# Patient Record
Sex: Female | Born: 1955
Health system: Southern US, Community
[De-identification: ages and names within clinical notes are randomized; demographics above are authoritative.]

## PROBLEM LIST (undated history)

## (undated) ENCOUNTER — Ambulatory Visit: Payer: Medicare HMO

## (undated) DIAGNOSIS — F32A Depression, unspecified: Secondary | ICD-10-CM

## (undated) DIAGNOSIS — Z8719 Personal history of other diseases of the digestive system: Secondary | ICD-10-CM

## (undated) DIAGNOSIS — E785 Hyperlipidemia, unspecified: Secondary | ICD-10-CM

## (undated) DIAGNOSIS — M51369 Other intervertebral disc degeneration, lumbar region without mention of lumbar back pain or lower extremity pain: Secondary | ICD-10-CM

## (undated) DIAGNOSIS — M5136 Other intervertebral disc degeneration, lumbar region: Secondary | ICD-10-CM

## (undated) DIAGNOSIS — E039 Hypothyroidism, unspecified: Secondary | ICD-10-CM

## (undated) DIAGNOSIS — R7303 Prediabetes: Secondary | ICD-10-CM

## (undated) DIAGNOSIS — K219 Gastro-esophageal reflux disease without esophagitis: Secondary | ICD-10-CM

## (undated) DIAGNOSIS — F419 Anxiety disorder, unspecified: Secondary | ICD-10-CM

## (undated) DIAGNOSIS — T7840XA Allergy, unspecified, initial encounter: Secondary | ICD-10-CM

## (undated) DIAGNOSIS — Z87442 Personal history of urinary calculi: Secondary | ICD-10-CM

## (undated) DIAGNOSIS — Z8709 Personal history of other diseases of the respiratory system: Secondary | ICD-10-CM

## (undated) DIAGNOSIS — Z9689 Presence of other specified functional implants: Secondary | ICD-10-CM

## (undated) DIAGNOSIS — E079 Disorder of thyroid, unspecified: Secondary | ICD-10-CM

## (undated) DIAGNOSIS — I10 Essential (primary) hypertension: Secondary | ICD-10-CM

## (undated) DIAGNOSIS — M1990 Unspecified osteoarthritis, unspecified site: Secondary | ICD-10-CM

## (undated) DIAGNOSIS — K759 Inflammatory liver disease, unspecified: Secondary | ICD-10-CM

## (undated) DIAGNOSIS — H269 Unspecified cataract: Secondary | ICD-10-CM

## (undated) HISTORY — PX: ESOPHAGEAL DILATION: SHX303

## (undated) HISTORY — DX: Essential (primary) hypertension: I10

## (undated) HISTORY — PX: CHOLECYSTECTOMY: SHX55

## (undated) HISTORY — DX: Allergy, unspecified, initial encounter: T78.40XA

## (undated) HISTORY — PX: LAPAROSCOPIC ENDOMETRIOSIS FULGURATION: SUR769

## (undated) HISTORY — DX: Disorder of thyroid, unspecified: E07.9

## (undated) HISTORY — DX: Hyperlipidemia, unspecified: E78.5

## (undated) HISTORY — DX: Inflammatory liver disease, unspecified: K75.9

## (undated) HISTORY — PX: TONSILLECTOMY: SUR1361

## (undated) HISTORY — PX: SPINE SURGERY: SHX786

## (undated) HISTORY — PX: TUBAL LIGATION: SHX77

## (undated) HISTORY — DX: Unspecified cataract: H26.9

## (undated) HISTORY — PX: COLONOSCOPY: SHX174

---

## 1980-10-18 HISTORY — PX: BREAST EXCISIONAL BIOPSY: SUR124

## 1981-10-18 HISTORY — PX: BREAST SURGERY: SHX581

## 1989-01-16 HISTORY — PX: OTHER SURGICAL HISTORY: SHX169

## 1999-11-09 ENCOUNTER — Other Ambulatory Visit: Admission: RE | Admit: 1999-11-09 | Discharge: 1999-11-09 | Payer: Self-pay | Admitting: Gynecology

## 1999-11-09 ENCOUNTER — Encounter (INDEPENDENT_AMBULATORY_CARE_PROVIDER_SITE_OTHER): Payer: Self-pay | Admitting: Specialist

## 2000-03-03 ENCOUNTER — Ambulatory Visit (HOSPITAL_COMMUNITY): Admission: RE | Admit: 2000-03-03 | Discharge: 2000-03-03 | Payer: Self-pay | Admitting: Gastroenterology

## 2000-03-03 ENCOUNTER — Encounter: Payer: Self-pay | Admitting: Gastroenterology

## 2000-03-18 ENCOUNTER — Encounter: Payer: Self-pay | Admitting: Gastroenterology

## 2000-03-18 ENCOUNTER — Ambulatory Visit (HOSPITAL_COMMUNITY): Admission: RE | Admit: 2000-03-18 | Discharge: 2000-03-18 | Payer: Self-pay | Admitting: Gastroenterology

## 2000-07-06 ENCOUNTER — Encounter: Payer: Self-pay | Admitting: Gastroenterology

## 2000-07-06 ENCOUNTER — Ambulatory Visit (HOSPITAL_COMMUNITY): Admission: RE | Admit: 2000-07-06 | Discharge: 2000-07-06 | Payer: Self-pay | Admitting: Gastroenterology

## 2000-08-09 ENCOUNTER — Encounter (INDEPENDENT_AMBULATORY_CARE_PROVIDER_SITE_OTHER): Payer: Self-pay | Admitting: Specialist

## 2000-08-09 ENCOUNTER — Ambulatory Visit (HOSPITAL_COMMUNITY): Admission: RE | Admit: 2000-08-09 | Discharge: 2000-08-10 | Payer: Self-pay | Admitting: *Deleted

## 2000-10-18 HISTORY — PX: LIVER BIOPSY: SHX301

## 2000-10-18 HISTORY — PX: CHOLECYSTECTOMY: SHX55

## 2000-12-13 ENCOUNTER — Encounter: Payer: Self-pay | Admitting: Emergency Medicine

## 2000-12-13 ENCOUNTER — Emergency Department (HOSPITAL_COMMUNITY): Admission: EM | Admit: 2000-12-13 | Discharge: 2000-12-13 | Payer: Self-pay | Admitting: Emergency Medicine

## 2001-04-28 ENCOUNTER — Encounter: Payer: Self-pay | Admitting: Gastroenterology

## 2001-04-28 ENCOUNTER — Ambulatory Visit (HOSPITAL_COMMUNITY): Admission: RE | Admit: 2001-04-28 | Discharge: 2001-04-28 | Payer: Self-pay | Admitting: Gastroenterology

## 2001-12-29 ENCOUNTER — Emergency Department (HOSPITAL_COMMUNITY): Admission: EM | Admit: 2001-12-29 | Discharge: 2001-12-30 | Payer: Self-pay | Admitting: Emergency Medicine

## 2002-01-10 ENCOUNTER — Ambulatory Visit (HOSPITAL_COMMUNITY): Admission: RE | Admit: 2002-01-10 | Discharge: 2002-01-10 | Payer: Self-pay | Admitting: Gastroenterology

## 2002-01-19 ENCOUNTER — Ambulatory Visit (HOSPITAL_COMMUNITY): Admission: RE | Admit: 2002-01-19 | Discharge: 2002-01-19 | Payer: Self-pay | Admitting: Gastroenterology

## 2002-01-19 ENCOUNTER — Encounter: Payer: Self-pay | Admitting: Gastroenterology

## 2002-03-18 DIAGNOSIS — B1710 Acute hepatitis C without hepatic coma: Secondary | ICD-10-CM | POA: Insufficient documentation

## 2003-05-06 ENCOUNTER — Encounter: Payer: Self-pay | Admitting: Neurosurgery

## 2003-05-06 ENCOUNTER — Encounter: Admission: RE | Admit: 2003-05-06 | Discharge: 2003-05-06 | Payer: Self-pay | Admitting: Neurosurgery

## 2003-05-06 ENCOUNTER — Encounter: Payer: Self-pay | Admitting: Diagnostic Radiology

## 2003-05-27 ENCOUNTER — Encounter: Payer: Self-pay | Admitting: Neurosurgery

## 2003-05-27 ENCOUNTER — Encounter: Admission: RE | Admit: 2003-05-27 | Discharge: 2003-05-27 | Payer: Self-pay | Admitting: Neurosurgery

## 2003-07-29 ENCOUNTER — Encounter: Payer: Self-pay | Admitting: Neurosurgery

## 2003-07-29 ENCOUNTER — Encounter: Admission: RE | Admit: 2003-07-29 | Discharge: 2003-07-29 | Payer: Self-pay | Admitting: Neurosurgery

## 2003-08-13 ENCOUNTER — Inpatient Hospital Stay (HOSPITAL_COMMUNITY): Admission: RE | Admit: 2003-08-13 | Discharge: 2003-08-16 | Payer: Self-pay | Admitting: Neurosurgery

## 2003-08-13 HISTORY — PX: LUMBAR DISC SURGERY: SHX700

## 2003-09-19 ENCOUNTER — Ambulatory Visit (HOSPITAL_COMMUNITY): Admission: EM | Admit: 2003-09-19 | Discharge: 2003-09-19 | Payer: Self-pay | Admitting: *Deleted

## 2003-09-19 ENCOUNTER — Encounter (INDEPENDENT_AMBULATORY_CARE_PROVIDER_SITE_OTHER): Payer: Self-pay | Admitting: Specialist

## 2004-02-04 ENCOUNTER — Other Ambulatory Visit: Admission: RE | Admit: 2004-02-04 | Discharge: 2004-02-04 | Payer: Self-pay | Admitting: Gynecology

## 2004-02-06 ENCOUNTER — Encounter: Admission: RE | Admit: 2004-02-06 | Discharge: 2004-02-06 | Payer: Self-pay | Admitting: Gynecology

## 2004-02-19 ENCOUNTER — Encounter: Admission: RE | Admit: 2004-02-19 | Discharge: 2004-02-19 | Payer: Self-pay | Admitting: Neurosurgery

## 2004-07-10 ENCOUNTER — Encounter: Admission: RE | Admit: 2004-07-10 | Discharge: 2004-07-10 | Payer: Self-pay | Admitting: Neurosurgery

## 2004-08-21 ENCOUNTER — Ambulatory Visit: Payer: Self-pay | Admitting: Internal Medicine

## 2004-09-28 ENCOUNTER — Ambulatory Visit: Payer: Self-pay | Admitting: Internal Medicine

## 2004-10-14 ENCOUNTER — Other Ambulatory Visit: Admission: RE | Admit: 2004-10-14 | Discharge: 2004-10-14 | Payer: Self-pay | Admitting: Gynecology

## 2004-10-27 ENCOUNTER — Ambulatory Visit: Payer: Self-pay | Admitting: Internal Medicine

## 2004-12-10 ENCOUNTER — Encounter: Admission: RE | Admit: 2004-12-10 | Discharge: 2004-12-10 | Payer: Self-pay | Admitting: Neurosurgery

## 2004-12-21 ENCOUNTER — Ambulatory Visit (HOSPITAL_COMMUNITY): Admission: RE | Admit: 2004-12-21 | Discharge: 2004-12-21 | Payer: Self-pay | Admitting: Neurosurgery

## 2004-12-29 ENCOUNTER — Ambulatory Visit (HOSPITAL_COMMUNITY): Admission: RE | Admit: 2004-12-29 | Discharge: 2004-12-30 | Payer: Self-pay | Admitting: Neurosurgery

## 2004-12-29 HISTORY — PX: SPINAL CORD STIMULATOR INSERTION: SHX5378

## 2005-01-01 ENCOUNTER — Inpatient Hospital Stay (HOSPITAL_COMMUNITY): Admission: EM | Admit: 2005-01-01 | Discharge: 2005-01-03 | Payer: Self-pay | Admitting: Emergency Medicine

## 2005-01-01 ENCOUNTER — Ambulatory Visit: Payer: Self-pay | Admitting: Endocrinology

## 2005-01-05 ENCOUNTER — Inpatient Hospital Stay (HOSPITAL_COMMUNITY): Admission: RE | Admit: 2005-01-05 | Discharge: 2005-01-06 | Payer: Self-pay | Admitting: Neurosurgery

## 2005-01-05 HISTORY — PX: OTHER SURGICAL HISTORY: SHX169

## 2005-01-13 ENCOUNTER — Ambulatory Visit: Payer: Self-pay | Admitting: Internal Medicine

## 2005-01-21 ENCOUNTER — Ambulatory Visit: Payer: Self-pay | Admitting: Internal Medicine

## 2005-02-08 ENCOUNTER — Encounter: Admission: RE | Admit: 2005-02-08 | Discharge: 2005-02-08 | Payer: Self-pay | Admitting: Internal Medicine

## 2005-02-09 ENCOUNTER — Ambulatory Visit: Payer: Self-pay | Admitting: Internal Medicine

## 2005-02-16 ENCOUNTER — Ambulatory Visit: Payer: Self-pay | Admitting: Gastroenterology

## 2005-03-11 ENCOUNTER — Ambulatory Visit: Payer: Self-pay | Admitting: Gastroenterology

## 2005-03-29 ENCOUNTER — Ambulatory Visit: Payer: Self-pay | Admitting: Gastroenterology

## 2005-06-16 ENCOUNTER — Ambulatory Visit: Payer: Self-pay | Admitting: Internal Medicine

## 2005-07-07 ENCOUNTER — Ambulatory Visit: Payer: Self-pay | Admitting: Internal Medicine

## 2005-07-23 ENCOUNTER — Inpatient Hospital Stay (HOSPITAL_COMMUNITY): Admission: RE | Admit: 2005-07-23 | Discharge: 2005-07-26 | Payer: Self-pay | Admitting: Neurosurgery

## 2005-07-23 HISTORY — PX: OTHER SURGICAL HISTORY: SHX169

## 2005-08-26 ENCOUNTER — Ambulatory Visit: Payer: Self-pay | Admitting: Internal Medicine

## 2005-09-06 ENCOUNTER — Ambulatory Visit: Payer: Self-pay | Admitting: Internal Medicine

## 2005-09-16 ENCOUNTER — Ambulatory Visit: Payer: Self-pay | Admitting: Internal Medicine

## 2005-09-17 ENCOUNTER — Inpatient Hospital Stay (HOSPITAL_COMMUNITY): Admission: EM | Admit: 2005-09-17 | Discharge: 2005-09-20 | Payer: Self-pay | Admitting: Emergency Medicine

## 2005-09-20 ENCOUNTER — Ambulatory Visit: Payer: Self-pay | Admitting: Internal Medicine

## 2005-09-22 ENCOUNTER — Ambulatory Visit: Payer: Self-pay | Admitting: Internal Medicine

## 2005-09-29 ENCOUNTER — Ambulatory Visit: Payer: Self-pay | Admitting: Internal Medicine

## 2005-11-01 ENCOUNTER — Ambulatory Visit: Payer: Self-pay | Admitting: Internal Medicine

## 2005-11-03 ENCOUNTER — Other Ambulatory Visit: Admission: RE | Admit: 2005-11-03 | Discharge: 2005-11-03 | Payer: Self-pay | Admitting: Gynecology

## 2005-11-05 ENCOUNTER — Ambulatory Visit: Payer: Self-pay | Admitting: Family Medicine

## 2005-11-08 ENCOUNTER — Ambulatory Visit: Payer: Self-pay | Admitting: Internal Medicine

## 2005-11-29 ENCOUNTER — Ambulatory Visit: Payer: Self-pay | Admitting: Internal Medicine

## 2006-01-18 ENCOUNTER — Ambulatory Visit: Payer: Self-pay | Admitting: Family Medicine

## 2006-02-09 ENCOUNTER — Encounter: Admission: RE | Admit: 2006-02-09 | Discharge: 2006-02-09 | Payer: Self-pay | Admitting: Gynecology

## 2006-02-18 ENCOUNTER — Ambulatory Visit: Payer: Self-pay | Admitting: Internal Medicine

## 2006-03-11 ENCOUNTER — Encounter: Admission: RE | Admit: 2006-03-11 | Discharge: 2006-03-11 | Payer: Self-pay | Admitting: Gynecology

## 2006-03-31 ENCOUNTER — Ambulatory Visit: Payer: Self-pay | Admitting: Family Medicine

## 2006-03-31 ENCOUNTER — Ambulatory Visit: Payer: Self-pay | Admitting: Cardiology

## 2006-04-06 ENCOUNTER — Encounter: Admission: RE | Admit: 2006-04-06 | Discharge: 2006-04-06 | Payer: Self-pay | Admitting: Neurosurgery

## 2006-04-28 ENCOUNTER — Encounter: Payer: Self-pay | Admitting: Internal Medicine

## 2006-05-13 ENCOUNTER — Inpatient Hospital Stay (HOSPITAL_COMMUNITY): Admission: RE | Admit: 2006-05-13 | Discharge: 2006-05-16 | Payer: Self-pay | Admitting: Neurosurgery

## 2006-05-13 HISTORY — PX: OTHER SURGICAL HISTORY: SHX169

## 2006-06-23 ENCOUNTER — Ambulatory Visit: Payer: Self-pay | Admitting: Internal Medicine

## 2006-08-02 ENCOUNTER — Ambulatory Visit: Payer: Self-pay | Admitting: Internal Medicine

## 2006-08-02 ENCOUNTER — Encounter: Admission: RE | Admit: 2006-08-02 | Discharge: 2006-08-02 | Payer: Self-pay | Admitting: Internal Medicine

## 2006-09-20 ENCOUNTER — Other Ambulatory Visit: Admission: RE | Admit: 2006-09-20 | Discharge: 2006-09-20 | Payer: Self-pay | Admitting: Gynecology

## 2006-09-26 ENCOUNTER — Ambulatory Visit: Payer: Self-pay | Admitting: Internal Medicine

## 2006-09-27 ENCOUNTER — Ambulatory Visit: Payer: Self-pay | Admitting: Gastroenterology

## 2006-10-14 ENCOUNTER — Ambulatory Visit: Payer: Self-pay | Admitting: Internal Medicine

## 2006-11-02 ENCOUNTER — Ambulatory Visit: Payer: Self-pay | Admitting: Internal Medicine

## 2006-11-24 ENCOUNTER — Ambulatory Visit: Payer: Self-pay | Admitting: Internal Medicine

## 2007-01-17 ENCOUNTER — Ambulatory Visit: Payer: Self-pay | Admitting: Internal Medicine

## 2007-01-17 LAB — CONVERTED CEMR LAB: TSH: 0.27 microintl units/mL — ABNORMAL LOW (ref 0.35–5.50)

## 2007-02-07 DIAGNOSIS — E890 Postprocedural hypothyroidism: Secondary | ICD-10-CM | POA: Insufficient documentation

## 2007-02-07 DIAGNOSIS — E039 Hypothyroidism, unspecified: Secondary | ICD-10-CM | POA: Insufficient documentation

## 2007-02-07 DIAGNOSIS — R51 Headache: Secondary | ICD-10-CM | POA: Insufficient documentation

## 2007-02-07 DIAGNOSIS — M899 Disorder of bone, unspecified: Secondary | ICD-10-CM | POA: Insufficient documentation

## 2007-02-07 DIAGNOSIS — R519 Headache, unspecified: Secondary | ICD-10-CM | POA: Insufficient documentation

## 2007-02-07 DIAGNOSIS — M949 Disorder of cartilage, unspecified: Secondary | ICD-10-CM

## 2007-03-20 ENCOUNTER — Encounter: Payer: Self-pay | Admitting: Internal Medicine

## 2007-04-03 ENCOUNTER — Encounter: Payer: Self-pay | Admitting: Internal Medicine

## 2007-04-03 ENCOUNTER — Encounter: Admission: RE | Admit: 2007-04-03 | Discharge: 2007-04-03 | Payer: Self-pay | Admitting: Neurosurgery

## 2007-04-05 ENCOUNTER — Encounter: Admission: RE | Admit: 2007-04-05 | Discharge: 2007-04-05 | Payer: Self-pay | Admitting: Internal Medicine

## 2007-04-06 ENCOUNTER — Ambulatory Visit: Payer: Self-pay | Admitting: Internal Medicine

## 2007-04-06 DIAGNOSIS — G4700 Insomnia, unspecified: Secondary | ICD-10-CM | POA: Insufficient documentation

## 2007-04-10 ENCOUNTER — Encounter (INDEPENDENT_AMBULATORY_CARE_PROVIDER_SITE_OTHER): Payer: Self-pay | Admitting: *Deleted

## 2007-04-10 LAB — CONVERTED CEMR LAB
CO2: 28 meq/L (ref 19–32)
Chloride: 109 meq/L (ref 96–112)
Creatinine, Ser: 0.8 mg/dL (ref 0.4–1.2)
Sodium: 141 meq/L (ref 135–145)
TSH: 0.48 microintl units/mL (ref 0.35–5.50)

## 2007-04-18 ENCOUNTER — Ambulatory Visit: Payer: Self-pay | Admitting: Gastroenterology

## 2007-04-27 ENCOUNTER — Ambulatory Visit: Payer: Self-pay | Admitting: Internal Medicine

## 2007-04-27 DIAGNOSIS — K5909 Other constipation: Secondary | ICD-10-CM | POA: Insufficient documentation

## 2007-05-03 LAB — CONVERTED CEMR LAB
Basophils Relative: 0.2 % (ref 0.0–1.0)
Eosinophils Absolute: 0.2 10*3/uL (ref 0.0–0.6)
HDL: 45.1 mg/dL (ref >39.0)
Lymphocytes Relative: 33.7 % (ref 12.0–46.0)
MCV: 82 fL (ref 78.0–100.0)
Monocytes Relative: 9.6 % (ref 3.0–11.0)
Neutro Abs: 3 10*3/uL (ref 1.4–7.7)
Platelets: 211 10*3/uL (ref 150–400)
RBC: 4.91 M/uL (ref 3.87–5.11)
Total CHOL/HDL Ratio: 4.3
Triglycerides: 130 mg/dL (ref 0–149)
VLDL: 26 mg/dL (ref 0–40)

## 2007-05-31 ENCOUNTER — Telehealth (INDEPENDENT_AMBULATORY_CARE_PROVIDER_SITE_OTHER): Payer: Self-pay | Admitting: *Deleted

## 2007-06-01 ENCOUNTER — Encounter: Payer: Self-pay | Admitting: Internal Medicine

## 2007-06-02 ENCOUNTER — Ambulatory Visit: Payer: Self-pay | Admitting: Family Medicine

## 2007-06-02 ENCOUNTER — Encounter: Payer: Self-pay | Admitting: Internal Medicine

## 2007-06-04 ENCOUNTER — Emergency Department (HOSPITAL_COMMUNITY): Admission: EM | Admit: 2007-06-04 | Discharge: 2007-06-04 | Payer: Self-pay | Admitting: Emergency Medicine

## 2007-06-23 ENCOUNTER — Telehealth (INDEPENDENT_AMBULATORY_CARE_PROVIDER_SITE_OTHER): Payer: Self-pay | Admitting: *Deleted

## 2007-07-10 ENCOUNTER — Encounter: Admission: RE | Admit: 2007-07-10 | Discharge: 2007-10-08 | Payer: Self-pay | Admitting: Neurosurgery

## 2007-08-18 ENCOUNTER — Telehealth: Payer: Self-pay | Admitting: Internal Medicine

## 2007-08-21 ENCOUNTER — Ambulatory Visit: Payer: Self-pay | Admitting: Internal Medicine

## 2007-08-23 ENCOUNTER — Encounter (INDEPENDENT_AMBULATORY_CARE_PROVIDER_SITE_OTHER): Payer: Self-pay | Admitting: *Deleted

## 2007-08-25 LAB — CONVERTED CEMR LAB
ALT: 23 units/L (ref 0–35)
Albumin: 3.8 g/dL (ref 3.5–5.2)
Alkaline Phosphatase: 62 units/L (ref 39–117)

## 2007-09-07 ENCOUNTER — Other Ambulatory Visit: Admission: RE | Admit: 2007-09-07 | Discharge: 2007-09-07 | Payer: Self-pay | Admitting: Gynecology

## 2007-09-07 ENCOUNTER — Encounter: Payer: Self-pay | Admitting: Internal Medicine

## 2007-09-26 ENCOUNTER — Ambulatory Visit: Payer: Self-pay | Admitting: Internal Medicine

## 2007-10-05 ENCOUNTER — Ambulatory Visit: Payer: Self-pay | Admitting: Internal Medicine

## 2007-10-09 ENCOUNTER — Telehealth: Payer: Self-pay | Admitting: Internal Medicine

## 2007-10-30 HISTORY — PX: NASAL SINUS SURGERY: SHX719

## 2007-11-21 ENCOUNTER — Encounter: Payer: Self-pay | Admitting: Internal Medicine

## 2007-11-30 ENCOUNTER — Ambulatory Visit: Payer: Self-pay | Admitting: Internal Medicine

## 2007-11-30 DIAGNOSIS — E669 Obesity, unspecified: Secondary | ICD-10-CM | POA: Insufficient documentation

## 2008-01-02 ENCOUNTER — Encounter (INDEPENDENT_AMBULATORY_CARE_PROVIDER_SITE_OTHER): Payer: Self-pay | Admitting: *Deleted

## 2008-01-04 ENCOUNTER — Telehealth: Payer: Self-pay | Admitting: Internal Medicine

## 2008-01-15 ENCOUNTER — Telehealth: Payer: Self-pay | Admitting: Internal Medicine

## 2008-01-24 ENCOUNTER — Ambulatory Visit: Payer: Self-pay | Admitting: Internal Medicine

## 2008-01-24 DIAGNOSIS — F341 Dysthymic disorder: Secondary | ICD-10-CM | POA: Insufficient documentation

## 2008-01-25 ENCOUNTER — Encounter: Payer: Self-pay | Admitting: Internal Medicine

## 2008-01-25 ENCOUNTER — Telehealth (INDEPENDENT_AMBULATORY_CARE_PROVIDER_SITE_OTHER): Payer: Self-pay | Admitting: *Deleted

## 2008-01-28 ENCOUNTER — Encounter: Payer: Self-pay | Admitting: Internal Medicine

## 2008-02-02 ENCOUNTER — Encounter: Admission: RE | Admit: 2008-02-02 | Discharge: 2008-02-02 | Payer: Self-pay | Admitting: Endocrinology

## 2008-02-05 ENCOUNTER — Ambulatory Visit: Payer: Self-pay | Admitting: Internal Medicine

## 2008-02-05 ENCOUNTER — Telehealth: Payer: Self-pay | Admitting: Internal Medicine

## 2008-02-19 ENCOUNTER — Encounter: Payer: Self-pay | Admitting: Internal Medicine

## 2008-02-22 ENCOUNTER — Ambulatory Visit: Payer: Self-pay | Admitting: Psychiatry

## 2008-02-22 ENCOUNTER — Encounter: Payer: Self-pay | Admitting: Internal Medicine

## 2008-02-27 ENCOUNTER — Ambulatory Visit: Payer: Self-pay | Admitting: Internal Medicine

## 2008-02-29 ENCOUNTER — Ambulatory Visit: Payer: Self-pay | Admitting: Psychiatry

## 2008-03-04 ENCOUNTER — Telehealth (INDEPENDENT_AMBULATORY_CARE_PROVIDER_SITE_OTHER): Payer: Self-pay | Admitting: *Deleted

## 2008-03-05 ENCOUNTER — Ambulatory Visit: Payer: Self-pay | Admitting: Internal Medicine

## 2008-03-13 ENCOUNTER — Ambulatory Visit: Payer: Self-pay | Admitting: Psychiatry

## 2008-03-25 ENCOUNTER — Ambulatory Visit: Payer: Self-pay | Admitting: Psychiatry

## 2008-03-26 ENCOUNTER — Ambulatory Visit: Payer: Self-pay | Admitting: Internal Medicine

## 2008-04-01 ENCOUNTER — Telehealth: Payer: Self-pay | Admitting: Internal Medicine

## 2008-04-08 ENCOUNTER — Encounter: Admission: RE | Admit: 2008-04-08 | Discharge: 2008-04-08 | Payer: Self-pay | Admitting: Internal Medicine

## 2008-04-08 ENCOUNTER — Ambulatory Visit: Payer: Self-pay | Admitting: Psychiatry

## 2008-04-10 ENCOUNTER — Telehealth: Payer: Self-pay | Admitting: Internal Medicine

## 2008-04-15 ENCOUNTER — Encounter: Payer: Self-pay | Admitting: Internal Medicine

## 2008-04-17 ENCOUNTER — Encounter: Payer: Self-pay | Admitting: Internal Medicine

## 2008-05-15 ENCOUNTER — Encounter: Admission: RE | Admit: 2008-05-15 | Discharge: 2008-05-15 | Payer: Self-pay | Admitting: Internal Medicine

## 2008-05-21 ENCOUNTER — Encounter: Payer: Self-pay | Admitting: Internal Medicine

## 2008-05-29 ENCOUNTER — Ambulatory Visit: Payer: Self-pay | Admitting: Internal Medicine

## 2008-05-30 ENCOUNTER — Telehealth (INDEPENDENT_AMBULATORY_CARE_PROVIDER_SITE_OTHER): Payer: Self-pay | Admitting: *Deleted

## 2008-06-03 ENCOUNTER — Telehealth: Payer: Self-pay | Admitting: Internal Medicine

## 2008-06-10 ENCOUNTER — Ambulatory Visit: Payer: Self-pay | Admitting: Psychiatry

## 2008-06-27 ENCOUNTER — Ambulatory Visit: Payer: Self-pay | Admitting: Psychiatry

## 2008-07-03 ENCOUNTER — Encounter: Payer: Self-pay | Admitting: Internal Medicine

## 2008-07-05 ENCOUNTER — Ambulatory Visit: Payer: Self-pay | Admitting: Internal Medicine

## 2008-07-05 DIAGNOSIS — R030 Elevated blood-pressure reading, without diagnosis of hypertension: Secondary | ICD-10-CM | POA: Insufficient documentation

## 2008-07-19 ENCOUNTER — Ambulatory Visit: Payer: Self-pay | Admitting: Family Medicine

## 2008-07-19 ENCOUNTER — Telehealth (INDEPENDENT_AMBULATORY_CARE_PROVIDER_SITE_OTHER): Payer: Self-pay | Admitting: *Deleted

## 2008-07-19 DIAGNOSIS — J019 Acute sinusitis, unspecified: Secondary | ICD-10-CM | POA: Insufficient documentation

## 2008-07-19 LAB — CONVERTED CEMR LAB
Inflenza A Ag: NEGATIVE
Influenza B Ag: NEGATIVE

## 2008-07-23 ENCOUNTER — Encounter: Admission: RE | Admit: 2008-07-23 | Discharge: 2008-07-23 | Payer: Self-pay | Admitting: Endocrinology

## 2008-08-14 ENCOUNTER — Ambulatory Visit: Payer: Self-pay | Admitting: Internal Medicine

## 2008-09-09 ENCOUNTER — Encounter: Payer: Self-pay | Admitting: Internal Medicine

## 2008-10-02 ENCOUNTER — Encounter: Payer: Self-pay | Admitting: Family Medicine

## 2008-10-14 ENCOUNTER — Encounter (INDEPENDENT_AMBULATORY_CARE_PROVIDER_SITE_OTHER): Payer: Self-pay | Admitting: *Deleted

## 2008-11-06 ENCOUNTER — Encounter: Payer: Self-pay | Admitting: Gynecology

## 2008-11-06 ENCOUNTER — Ambulatory Visit: Payer: Self-pay | Admitting: Gynecology

## 2008-11-06 ENCOUNTER — Other Ambulatory Visit: Admission: RE | Admit: 2008-11-06 | Discharge: 2008-11-06 | Payer: Self-pay | Admitting: Gynecology

## 2008-11-11 ENCOUNTER — Ambulatory Visit: Payer: Self-pay | Admitting: Gynecology

## 2008-11-18 ENCOUNTER — Ambulatory Visit: Payer: Self-pay | Admitting: Gynecology

## 2008-12-10 ENCOUNTER — Encounter: Admission: RE | Admit: 2008-12-10 | Discharge: 2008-12-10 | Payer: Self-pay | Admitting: Neurosurgery

## 2008-12-30 ENCOUNTER — Encounter: Payer: Self-pay | Admitting: Internal Medicine

## 2008-12-31 ENCOUNTER — Ambulatory Visit: Payer: Self-pay | Admitting: Gynecology

## 2009-03-05 ENCOUNTER — Encounter: Admission: RE | Admit: 2009-03-05 | Discharge: 2009-03-05 | Payer: Self-pay | Admitting: Neurosurgery

## 2009-03-28 ENCOUNTER — Encounter: Admission: RE | Admit: 2009-03-28 | Discharge: 2009-03-28 | Payer: Self-pay | Admitting: Neurosurgery

## 2009-06-06 ENCOUNTER — Encounter: Admission: RE | Admit: 2009-06-06 | Discharge: 2009-06-06 | Payer: Self-pay | Admitting: Internal Medicine

## 2009-06-13 ENCOUNTER — Encounter: Admission: RE | Admit: 2009-06-13 | Discharge: 2009-06-13 | Payer: Self-pay | Admitting: Internal Medicine

## 2009-07-07 ENCOUNTER — Ambulatory Visit: Payer: Self-pay | Admitting: Gynecology

## 2009-08-11 ENCOUNTER — Encounter: Admission: RE | Admit: 2009-08-11 | Discharge: 2009-08-11 | Payer: Self-pay | Admitting: Neurosurgery

## 2009-09-22 ENCOUNTER — Encounter: Admission: RE | Admit: 2009-09-22 | Discharge: 2009-09-22 | Payer: Self-pay | Admitting: Neurosurgery

## 2009-09-25 ENCOUNTER — Ambulatory Visit: Payer: Self-pay | Admitting: Gastroenterology

## 2009-09-30 ENCOUNTER — Ambulatory Visit: Payer: Self-pay | Admitting: Gynecology

## 2009-10-01 ENCOUNTER — Ambulatory Visit: Payer: Self-pay | Admitting: Gynecology

## 2009-10-20 ENCOUNTER — Ambulatory Visit: Payer: Self-pay | Admitting: Gynecology

## 2009-11-07 ENCOUNTER — Other Ambulatory Visit: Admission: RE | Admit: 2009-11-07 | Discharge: 2009-11-07 | Payer: Self-pay | Admitting: Gynecology

## 2009-11-07 ENCOUNTER — Ambulatory Visit: Payer: Self-pay | Admitting: Gynecology

## 2009-11-27 ENCOUNTER — Encounter: Admission: RE | Admit: 2009-11-27 | Discharge: 2009-11-27 | Payer: Self-pay | Admitting: Gastroenterology

## 2010-01-01 ENCOUNTER — Ambulatory Visit: Payer: Self-pay | Admitting: Gynecology

## 2010-01-02 ENCOUNTER — Encounter: Admission: RE | Admit: 2010-01-02 | Discharge: 2010-01-02 | Payer: Self-pay | Admitting: Foot & Ankle Surgery

## 2010-02-16 ENCOUNTER — Ambulatory Visit: Payer: Self-pay | Admitting: Gynecology

## 2010-03-09 ENCOUNTER — Ambulatory Visit: Payer: Self-pay | Admitting: Gynecology

## 2010-04-06 ENCOUNTER — Ambulatory Visit: Payer: Self-pay | Admitting: Gynecology

## 2010-06-04 ENCOUNTER — Ambulatory Visit: Payer: Self-pay | Admitting: Women's Health

## 2010-06-08 ENCOUNTER — Encounter: Admission: RE | Admit: 2010-06-08 | Discharge: 2010-06-08 | Payer: Self-pay | Admitting: Internal Medicine

## 2010-07-13 ENCOUNTER — Encounter: Admission: RE | Admit: 2010-07-13 | Discharge: 2010-07-13 | Payer: Self-pay | Admitting: Neurosurgery

## 2010-09-07 ENCOUNTER — Ambulatory Visit: Payer: Self-pay | Admitting: Women's Health

## 2010-09-30 ENCOUNTER — Ambulatory Visit: Payer: Self-pay | Admitting: Women's Health

## 2010-11-08 ENCOUNTER — Encounter: Payer: Self-pay | Admitting: Neurosurgery

## 2010-11-08 ENCOUNTER — Encounter: Payer: Self-pay | Admitting: Gynecology

## 2010-11-09 ENCOUNTER — Encounter: Payer: Self-pay | Admitting: Internal Medicine

## 2010-11-23 ENCOUNTER — Other Ambulatory Visit: Payer: Self-pay | Admitting: Endocrinology

## 2010-11-23 DIAGNOSIS — E049 Nontoxic goiter, unspecified: Secondary | ICD-10-CM

## 2010-11-24 ENCOUNTER — Ambulatory Visit
Admission: RE | Admit: 2010-11-24 | Discharge: 2010-11-24 | Disposition: A | Discharge status: Home / Self Care | Payer: Medicare Other | Source: Ambulatory Visit | Attending: Endocrinology | Admitting: Endocrinology

## 2010-11-24 DIAGNOSIS — E049 Nontoxic goiter, unspecified: Secondary | ICD-10-CM

## 2010-12-11 ENCOUNTER — Other Ambulatory Visit: Payer: PRIVATE HEALTH INSURANCE

## 2010-12-11 ENCOUNTER — Other Ambulatory Visit: Payer: Medicare Other

## 2010-12-11 ENCOUNTER — Ambulatory Visit (INDEPENDENT_AMBULATORY_CARE_PROVIDER_SITE_OTHER): Payer: Medicare Other | Admitting: Women's Health

## 2010-12-11 DIAGNOSIS — D259 Leiomyoma of uterus, unspecified: Secondary | ICD-10-CM

## 2010-12-11 DIAGNOSIS — N950 Postmenopausal bleeding: Secondary | ICD-10-CM

## 2010-12-17 ENCOUNTER — Other Ambulatory Visit: Payer: Self-pay | Admitting: Neurosurgery

## 2010-12-17 DIAGNOSIS — R52 Pain, unspecified: Secondary | ICD-10-CM

## 2010-12-18 ENCOUNTER — Ambulatory Visit
Admission: RE | Admit: 2010-12-18 | Discharge: 2010-12-18 | Disposition: A | Discharge status: Home / Self Care | Payer: Medicare Other | Source: Ambulatory Visit | Attending: Neurosurgery | Admitting: Neurosurgery

## 2010-12-18 DIAGNOSIS — R52 Pain, unspecified: Secondary | ICD-10-CM

## 2011-01-01 ENCOUNTER — Ambulatory Visit (INDEPENDENT_AMBULATORY_CARE_PROVIDER_SITE_OTHER): Payer: Medicare Other | Admitting: Women's Health

## 2011-01-01 DIAGNOSIS — Z9889 Other specified postprocedural states: Secondary | ICD-10-CM

## 2011-01-07 ENCOUNTER — Encounter (INDEPENDENT_AMBULATORY_CARE_PROVIDER_SITE_OTHER): Payer: Medicare Other

## 2011-01-07 DIAGNOSIS — M949 Disorder of cartilage, unspecified: Secondary | ICD-10-CM

## 2011-01-07 DIAGNOSIS — M899 Disorder of bone, unspecified: Secondary | ICD-10-CM

## 2011-01-13 ENCOUNTER — Encounter (INDEPENDENT_AMBULATORY_CARE_PROVIDER_SITE_OTHER): Payer: Medicare Other | Admitting: Women's Health

## 2011-01-13 DIAGNOSIS — N952 Postmenopausal atrophic vaginitis: Secondary | ICD-10-CM

## 2011-01-13 DIAGNOSIS — N951 Menopausal and female climacteric states: Secondary | ICD-10-CM

## 2011-01-13 DIAGNOSIS — Z79890 Hormone replacement therapy (postmenopausal): Secondary | ICD-10-CM

## 2011-01-19 ENCOUNTER — Other Ambulatory Visit: Payer: Self-pay | Admitting: Neurosurgery

## 2011-01-19 ENCOUNTER — Ambulatory Visit
Admission: RE | Admit: 2011-01-19 | Discharge: 2011-01-19 | Disposition: A | Discharge status: Home / Self Care | Payer: Medicare Other | Source: Ambulatory Visit | Attending: Neurosurgery | Admitting: Neurosurgery

## 2011-01-19 DIAGNOSIS — R52 Pain, unspecified: Secondary | ICD-10-CM

## 2011-03-05 NOTE — H&P (Signed)
[name], [name] NO.:  [id number]   MEDICAL RECORD NO.:  [id number]          PATIENT TYPE:  OIB   LOCATION:  2899                         FACILITY:  [facility]   PHYSICIAN:  Payton Doughty, M.D.      DATE OF BIRTH:  08/16/1956   DATE OF ADMISSION:  12/29/2004  DATE OF DISCHARGE:                                HISTORY & PHYSICAL   ADMITTING DIAGNOSIS:  Right chronic L5 radiculopathy.   SERVICE:  Neurosurgery.   A now 55 year old right-handed white girl who had had a ruptured disc  operated on by Dr. Thurston Hole numerous years ago, had a disc recurrence at 5-1,  underwent fusion, and has had chronic right L5 radiculopathy ever since her  disc recurrence. She presents now after fusion with chronic leg pain for  spinal cord stimulator trial.   MEDICAL HISTORY:  Is remarkable for migraines. She is on:  1.  Paxil 40 mg a day.  2.  Levothroid 0.075 mg a day.  3.  Soma on a p.r.n. basis.  4.  Multivitamin.  5.  Klonopin 0.75 mg a night for sleep.  6.  Lortab.  7.  Robaxin p.r.n.   ALLERGIES:  She is allergic to [facility].   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   FAMILY HISTORY:  Mom's history is not given. Daddy is 3, fair health -  heart disease, stroke, and hypertension.   REVIEW OF SYSTEMS:  Remarkable for indigestion, liver disease, abdominal  pain, arm weakness, arm pain, neck pain, depression, and thyroid disease.   PHYSICAL EXAMINATION:  HEENT:  Within normal limits. She has reasonable  range of motion of her neck. Turning her head towards the right causes  discomfort in the right arm.  CHEST:  Clear.  CARDIAC:  Regular rate and rhythm.  ABDOMEN:  Nontender with no hepatosplenomegaly.  EXTREMITIES:  Without clubbing or cyanosis.  GENITOURINARY:  Deferred.  PERIPHERAL PULSES:  Good.  NEUROLOGIC:  She is awake, alert, and oriented. Cranial nerves are intact.  Motor exam shows 5/5  strength throughout the upper and lower extremities  save for mild triceps weakness on the right at 4/5 and mild dorsiflexion  weakness in the right at 4/5. Straight leg raise is positive on the right  and she has burning pain down the right leg. Reflexes are 2 at the knees, 1  at the ankles, toes are downgoing bilaterally.   MR demonstrates no significant nerve root compression and solid fusion.   CLINICAL IMPRESSION:  Chronic radiculopathy related to an L5 nerve root  injury.   The plan is for placement of a spinal cord stimulator. The risks and  benefits of this approach have been discussed with her and she wishes to  proceed.      MWR/MEDQ  D:  12/29/2004  T:  12/29/2004  Job:  [id number]

## 2011-03-05 NOTE — H&P (Signed)
[name], [name]              ACCOUNT NO.:  [id number]   MEDICAL RECORD NO.:  [id number]          PATIENT TYPE:  INP   LOCATION:  0104                         FACILITY:  [facility]   PHYSICIAN:  Sean A. Everardo All, M.D. [facility] OF BIRTH:  05/04/1956   DATE OF ADMISSION:  01/01/2005  DATE OF DISCHARGE:                                HISTORY & PHYSICAL   REASON FOR ADMISSION:  Abdominal pain.   HISTORY OF PRESENT ILLNESS:  The patient is a 55 year old woman with eight  hours of severe pain across the lower abdomen with associated nausea and  vomiting.   PAST MEDICAL HISTORY:  1.  Migraine.  2.  Hepatitis C.  3.  Chronic low back pain.  4.  Hypothyroidism.   MEDICATIONS:  1.  Levothroid 75 mcg a day.  2.  She also takes an uncertain type of antibiotic because of her never      stimulator implanted in her lower back.  3.  She also takes Ambien 10 mg q.h.s.  4.  Darvocet as needed for back pain.  5.  Lexapro at an uncertain dosage.   SOCIAL HISTORY:  She is single.  She is disabled due to her low back pain.  Her brother is here.  She neither drinks nor smokes.   FAMILY HISTORY:  No one else at home is ill.   REVIEW OF SYSTEMS:  Denies the following, rectal bleeding, hematuria, loss  of consciousness, fever, shortness of breath, chest pain, weight gain,  weight loss, dysuria, skin rash, and cough.   PHYSICAL EXAMINATION:  VITAL SIGNS:  Blood pressure 128/72, heart rate is  72, respiratory rate id 20.  The patient is afebrile.  GENERAL:  No distress.  SKIN:  No diaphoretic.  HEENT:  No proptosis.  No periorbital swelling.  Pharynx, no erythema.  NECK:  Supple.  CHEST:  Clear to auscultation.  CARDIOVASCULAR:  No JVD.  No edema.  Regular rate and rhythm.  No murmur.  Pedal pulses are intact.  ABDOMEN:  Soft and nontender, except slightly tender across the lower  quadrants.  No hepatosplenomegaly.  No mass.  It is not distended.  BREASTS/GYNECOLOGIC/RECTAL:  Not done at this  time due to the patient's  condition.  EXTREMITIES:  No deformity is seen.  NEUROLOGIC:  Alert, well oriented.  Cranial nerves appear to be intact.  She  readily moves all four extremities.   CT of the abdomen shows colitis.  CBC remarkable only for WBC of 13,300.  CMAT remarkable only for elevated SGPT.   IMPRESSION:  1.  Abdominal pain/colitis.  2.  History of hepatitis C with elevated transaminases.  3.  Hypothyroidism.  4.  Chronic depression and insomnia.   PLAN:  1.  Admit to regular medical bed.  2.  Intravenous fluid.  3.  Blood cultures.  4.  Antibiotics.  5.  Check C. difficile toxin.  6.  Symptomatic therapy.  7.  I discussed code status with the patient and she requests DNR.      SAE/MEDQ  D:  01/01/2005  T:  01/01/2005  Job:  [id number]  cc:   Wanda Plump, MD [facility]  [phone] [address] [place], [place] [address]

## 2011-03-05 NOTE — H&P (Signed)
NAMEDORETHIA, [name]              ACCOUNT NO.:  [id number]   MEDICAL RECORD NO.:  [id number]          PATIENT TYPE:  INP   LOCATION:  2899                         FACILITY:  [facility]   PHYSICIAN:  Payton Doughty, M.D.      DATE OF BIRTH:  October 13, 1956   DATE OF ADMISSION:  05/13/2006  DATE OF DISCHARGE:                                HISTORY & PHYSICAL   ADMISSION DIAGNOSIS:  Cervical spondylosis at C6-7, question nonunion 4-5.   HISTORY OF PRESENT ILLNESS:  This is a 55 year old right-handed white woman  who has been seen since 1994 for neck difficulty.  She has also had  spondylosis and lumbar fusion.  Spinal cord stimulator seems to be doing  well, but she has had intractable back and arm pain and last October  underwent an anterior decompression at C4-5 and C5-6 and did well.  She has  now developed once again pain in her neck and out her right arm with  numbness in the middle three fingers of her right hand.  Myelography  demonstrated spondylosis at C6-7 as well as a questionable nonunion at C4-5  and she is here for posterior stabilization from C4 to C7.   PAST MEDICAL HISTORY:  Remarkable for migraines, lumbar disk in 1990, and  lumbar fusion in 2005.   MEDICATIONS:  Paxil, Levothroid, Zomig, multivitamin, Klonopin, Lortab, and  Robaxin.   ALLERGIES:  She is sensitive to [facility].   SOCIAL HISTORY:  She does not smoke.  She drinks on a social basis.  She is  on disability.   FAMILY HISTORY:  Mother's history is good and father is 44 in fair health  with heart disease, stroke, and hypertension.   REVIEW OF SYSTEMS:  Remarkable for liver disease, abdominal pain, arm  weakness, arm pain, neck pain, depression, and thyroid disease.   PHYSICAL EXAMINATION:  HEENT:  Within normal limits.  She has limited  rupture of membranes in her neck with pain down her right arm.  CHEST:  Clear.  HEART:  Regular rate and rhythm.  ABDOMEN:  Nontender.  There is no hepatosplenomegaly.  EXTREMITIES:  Without clubbing or cyanosis.  GENITOURINARY:  Deferred.  Peripheral pulses are good.  NEUROLOGY:  She is awake, alert, and oriented.  Cranial nerves II-XII  grossly intact.  Motor examination shows 5/5 strength throughout the upper  and lower extremities save for the right triceps which is 4/5.  Sensory  deficit is described in the right C6-7 distribution.  Reflexes are 2 at the  biceps bilaterally, 2 at the left triceps, 1 at the right triceps, and 1 at  the brachial radialis bilaterally.  Hoffman's is negative.  Lower  extremities are nonmyelopathic.   Radiographic results have been reviewed above.   IMPRESSION:  Cervical spondylosis with progression of disease at C6-7 and a  questionable nonunion at C5-6.   PLAN:  Posterior cervical fusion from C4 to C7.  The risks and benefits of  this approach have been discussed with her and she wishes to proceed.           ______________________________  Emilie Rutter.  Channing Mutters, M.D.     MWR/MEDQ  D:  05/13/2006  T:  05/13/2006  Job:  [id number]

## 2011-03-05 NOTE — Discharge Summary (Signed)
NAMELACRECIA, DELVAL NO.:  [id number]   MEDICAL RECORD NO.:  [id number]          PATIENT TYPE:  INP   LOCATION:  3019                         FACILITY:  [facility]   PHYSICIAN:  Payton Doughty, M.D.      DATE OF BIRTH:  November 27, 1955   DATE OF ADMISSION:  05/13/2006  DATE OF DISCHARGE:  05/16/2006                                 DISCHARGE SUMMARY   DATE OF VISIT:  May 13, 2006   DISCHARGE:  May 16, 2006   Her main diagnosis is questionable nonunion C5-6, spondylosis C6-7.   DISCHARGE DIAGNOSIS:  Questionable nonunion C5-6, spondylosis C6-7.   PROCEDURES:  C4 to C7 posterior cervical fusion.   COMPLICATIONS:  None.   DISCHARGE:  Stable and well.   HOSPITAL COURSE:  This 55 year old right-handed white girl, whose history  and physical is recounted in the chart.  She has had a fusion of 4-5 and 5-  6, had a question of nonunion at 5-6  as well as spondylosis at 6-7.  She  was admitted after obtaining values and underwent a posterior cervical  fusion of C4-7.  Postoperatively, she has done well.  First postoperative  day, Foley was stopped.  She is quite sore in the neck as might imagine.  Strength remains full.  Incision is well healing.  Second postop day, she is  up mobilizing.  Third postop was sore neck.  No dysesthesias.  Full  strength.  Dressing was removed.  Her incisions dry and clean and her vital  signs are stable.  She is being discharge home to care of her family with  the dry intact incisions, with Darvocet for pain.  Her followup will be in  [facility] office in about 10 days for sutures.           ______________________________  Payton Doughty, M.D.     MWR/MEDQ  D:  05/16/2006  T:  05/16/2006  Job:  [id number]

## 2011-03-05 NOTE — Discharge Summary (Signed)
   NAME:  [name], [name]                        ACCOUNT NO.:  [id number]   MEDICAL RECORD NO.:  [id number]                   PATIENT TYPE:  INP   LOCATION:  3023                                 FACILITY:  [facility]   PHYSICIAN:  Payton Doughty, M.D.                   DATE OF BIRTH:  [birth year]   DATE OF ADMISSION:  08/13/2003  DATE OF DISCHARGE:  08/16/2003                                 DISCHARGE SUMMARY   ADMISSION DIAGNOSIS:  Recurrent herniated disk L5-S1.   DISCHARGE DIAGNOSIS:  Recurrent herniated disk L5-S1.   PROCEDURE:  L5-S1 laminectomy, diskectomy with posterior lumbar body fusion  cage and posterolateral arthrodesis.   COMPLICATIONS:  None.   DISCHARGE STATUS:  Doing well.   HISTORY:  This is a 55 year old right-handed white female with history and  physical as recorded in the chart.  She had a recurrent disk at 5-1 and was  admitted for fusion.  General exam was intact.  Neurologic exam was the  right S1 radiculopathy.   HOSPITAL COURSE:  She was admitted after ascertaining laboratory values and  underwent diskectomy and fusion.  Postoperatively she has done well.  Her  Foley was removed on her first postoperative day.  On the second  postoperative day her PCA was stopped.  She is currently awake, alert and  oriented, her cranial nerves are intact.  Motor exam shows 5/5 strength  throughout the lower extremities.  Her incision is dry.  She does have a  mild right S1 sensory deficit.   DISPOSITION:  She is being discharged home today on August 16, 2003 in care  of her family.  Percocet for pain and five days of ciprofloxin.  She will  follow up with me in the [facility] office in about a  week.                                                 Payton Doughty, M.D.    MWR/MEDQ  D:  08/16/2003  T:  08/16/2003  Job:  [id number]

## 2011-03-05 NOTE — Op Note (Signed)
[facility]  Patient:    ZANETTA, DEHAAN                       MRN: [id number] Proc. Date: 08/09/00 Adm. Date:  [id number] Attending:  Stephenie Acres                           Operative Report  PREOPERATIVE DIAGNOSIS:  Biliary dyskinesia.  POSTOPERATIVE DIAGNOSIS:  Biliary dyskinesia.  OPERATION:  Laparoscopic cholecystectomy.  SURGEON:  Catalina Lunger, M.D.  ASSISTANT:  Gita Kudo, M.D.  ANESTHESIA:  General.  DESCRIPTION OF PROCEDURE:  The patient was taken to the operating room and placed in the supine position.   After adequate anesthesia was induced using endotracheal tube, the abdomen was prepped and draped in the normal sterile fashion.  Via transverse infraumbilical incision, I dissected down the fascia. The fascia was opened vertically.  0 Vicryl pursestring suture was placed around the fascial defect.  The Hasson trocar was placed in the abdomen.  The abdomen was insufflated with carbon dioxide.  Under direct visualization, the gallbladder was identified and retracted cephalad.  After ports had been placed both in the subxiphoid region and the right abdomen, dissection began down in the infundibulum with the gallbladder until the cystic duct was easily identified.  This was dissected free and measured approximately 1 to 2 mm. It was triply clipped and divided.  True cystic artery was never identified. Meticulous dissection was undertaken, taking the gallbladder off the gallbladder bed and it was removed through the umbilical port.  Adequate hemostasis was ensured and the right upper quadrant was copiously irrigated and aspirated.  Abdomen was allowed to deflate.  All trocars were removed and injected using Marcaine. Fascial defect was closed with 0 Vicryl pursestring sutures.  Skin incisions were closed with subcuticular 4-0 Monocryl. Steri-Strips and sterile dressings were applied.  The patient tolerated  the procedure well and went to PACU in good condition. DD:  08/09/00 TD:  08/09/00 Job: [id number]

## 2011-03-05 NOTE — Op Note (Signed)
NAME:  [name], [name]                        ACCOUNT NO.:  [id number]   MEDICAL RECORD NO.:  [id number]                   PATIENT TYPE:  INP   LOCATION:  3172                                 FACILITY:  [facility]   PHYSICIAN:  Payton Doughty, M.D.                   DATE OF BIRTH:  Sep 28, 1956   DATE OF PROCEDURE:  08/13/2003  DATE OF DISCHARGE:                                 OPERATIVE REPORT   PREOPERATIVE DIAGNOSIS:  Recurrent herniated disc L5-S1, right.   POSTOPERATIVE DIAGNOSIS:  Recurrent herniated disc L5-S1, right.   OPERATIVE PROCEDURE:  L5-S1 laminectomy, discectomy, posterior lumbar  interbody fusion with right sided fusion cages, posterolateral arthrodesis  with VTOS bone marrow aspirate.   SURGEON:  Payton Doughty, M.D.   ASSISTANT: Tanya Nones. Jeral Fruit, M.D.   SERVICE:  Neurosurgery   ANESTHESIA:  General endotracheal anesthesia.   PREPARATION:  Betadine and alcohol wipe.   COMPLICATIONS:  None.   BODY OF TEXT:  55 year old right handed white female with severe right  lumbar radiculopathy and recurrent herniated disc at L5-S1 on the right.  She is taken to the operating room, general anesthesia, intubated, placed  prone on the operating table.  Following shave, prep, and drape in the usual  sterile fashion, the skin was infiltrated with 1% lidocaine with 1:100,000  epinephrine.  Her old skin incision was opened and the lamina, facet joint,  and transverse processes of L5 and S1 were exposed bilaterally in some  periosteal plane.  Interoperative x-ray confirmed correct level.  Having  confirmed correct level, the pars intra-articularis, lamina, and inferior  facet of L5 and superior facet of S1 were removed bilaterally.  On the right  side, there was a large recurrent disc under a copious amount of scar.  The  scar was carefully lysed and dissected protecting the L5 and S1 root.  The  disc was recovered and removed without difficulty resulting in immediate  decompression  of the right S1 root.  The left side anatomy was normal.  Following complete discectomy, right sided fusion cages, 12 by 21 mm, were  placed.  They were packed with bone graft harvested from the facet joints.  The Jamshidi needle was introduced into the right S1 pedicle and 10 mL of  bone marrow aspirate was removed.  This was placed on the VTOS strip.  The  transverse process and sacral ala were decorticated and the VTOS strip  placed there as a bone graft.  The wound was irrigated and hemostasis  assured.  The fascia was reapproximated with 0 Vicryl in an interrupted  fashion, the subcutaneous tissue were reapproximated with 0 Vicryl in an  interrupted fashion, the subcuticular tissues were reapproximated with 3-0  Vicryl in an interrupted fashion, and the skin was closed with 3-0 nylon in  a running locked fashion.  Betadine and Telfa dressing was applied and made  occlusive with  OpSite and the patient returned to the recovery room in good  condition.                                               Payton Doughty, M.D.    MWR/MEDQ  D:  08/13/2003  T:  08/13/2003  Job:  [id number]

## 2011-03-05 NOTE — Op Note (Signed)
[name], [name]              ACCOUNT NO.:  [id number]   MEDICAL RECORD NO.:  [id number]          PATIENT TYPE:  INP   LOCATION:  3019                         FACILITY:  [facility]   PHYSICIAN:  Payton Doughty, M.D.      DATE OF BIRTH:  08-Oct-1956   DATE OF PROCEDURE:  05/13/2006  DATE OF DISCHARGE:                                 OPERATIVE REPORT   PREOPERATIVE DIAGNOSIS:  Spondylosis C4 to C7.   POSTOPERATIVE DIAGNOSIS:  Spondylosis C4 to C7.   OPERATIVE PROCEDURE:  C4-5, C5-6, C6-7 posterior cervical fusion with OASIS  system infuse.   SURGEON:  Payton Doughty, M.D.   ANESTHESIA:  General endotracheal.   __________.   COMPLICATIONS:  None.   NURSE ASSISTANT:  [place]   DOCTORS ASSISTANT:  Hewitt Shorts, M.D.   BODY OF TEXT:  A 55 year old lady with a prior fusion at 5-6, 6-7.  Question  nonunion at 5-6 and spondylosis 6-7, taken to the operating room __________  intubated, placed prone on the operating table on the Stryker bed in the  Gardner-Wells tongs.  Following shave, prepped and draped in the usual  sterile fashion the skin was incised from C4 to C7 and  the lamina at C4,  C5, C6 and C7 were exposed bilaterally in the subperiosteal plane.  Intraoperative x-ray confirmed the correct level.  Lateral mass screws were  placed in 4, 5, 6 and 7 using the standard landmarks.  Once the screws were  placed rods were placed across them and tightened.  Intraoperative x-ray  showed good placement of screws and rod.  Infuse bone graft was placed on  the lamina following decortication.  The entire affair was closed in  successive layers of 0-Vicryl, 2-0 Vicryl and 3-0 Nylon.  Betadine and Telfa  dressing was applied.  The patient placed in an Aspen collar and returned to  recovery room in good condition.           ______________________________  Payton Doughty, M.D.     MWR/MEDQ  D:  05/13/2006  T:  05/14/2006  Job:  [id number]

## 2011-03-05 NOTE — Discharge Summary (Signed)
[name], [name]              ACCOUNT NO.:  [id number]   MEDICAL RECORD NO.:  [id number]          PATIENT TYPE:  INP   LOCATION:  0471                         [facility]ACILITY:  [facility]   PHYSICIAN:  Sean A. Everardo All, M.D. [facility] OF BIRTH:  Nov 10, 1955   DATE OF ADMISSION:  01/01/2005  DATE OF DISCHARGE:  01/03/2005                                 DISCHARGE SUMMARY   REASON FOR ADMISSION:  Abdominal pain.   HISTORY OF PRESENT ILLNESS:  The patient is a 55 year old woman whom I  admitted on January 01, 2005 with colitis. Please refer to my dictated history  and physical for details.   HOSPITAL COURSE:  She was admitted and given intravenous fluid, symptomatic  therapy, and intravenous antibiotics. Her symptoms quickly improved and by  the date of discharge her pain was resolved, she was having no further bowel  movements, and was ambulatory.   She developed erythema on the left arm above an IV site and was local care,  this was much better the morning of January 03, 2005.   For her chronic medical problems, such as depression, chronic back pain, and  insomnia, she continued her outpatient therapy, uneventfully.   By the morning of January 04, 2004, the patient was feeling much better,  eating and ambulatory and she was thus discharged home in good condition.   DISCHARGE DIAGNOSES:  1.  Abdominal pain/colitis, better.  2.  Left arm erythema above an IV site, better with local care.  3.  Stable chronic medical problems during this hospitalization of insomnia,      low back pain, and depression.   MEDICATIONS:  1.  Phenergan 12.5 mg, 1 or 2 every 6 hours as needed for nausea.  2.  Continue the antibiotic (type unknown) that has been prescribed by Dr.      Channing Mutters for your lower back nerve stimulation implantation.  3.  Ambien 10 mg every night as needed for sleep.  4.  Levothroid 75 mcg daily.  5.  Continue the same Lexapro and Darvocet that you were taking at home,      dosage is  uncertain.   No specific restriction on diet or activity and followup will be with Dr.  Drue Novel in 1 or 2 weeks.     SAE/MEDQ  D:  01/03/2005  T:  01/04/2005  Job:  [id number]

## 2011-03-05 NOTE — Procedures (Signed)
[facility]  Patient:    [name], [name]                       MRN: [id number] Proc. Date: 03/18/00 Adm. Date:  [id number] Disc. Date: [id number] Attending:  Louie Bun CC:         Al Decant. Janey Greaser, M.D.                           Procedure Report  PROCEDURE PERFORMED:  Esophagogastroduodenoscopy with esophageal dilatation.  ENDOSCOPIST:  Everardo All. Madilyn Fireman, M.D.  INDICATIONS FOR PROCEDURE:  Solid food dysphagia typical for esophageal stricture.  DESCRIPTION OF PROCEDURE:  The patient was placed in the left lateral decubitus position and placed on the pulse monitor and continuous low flow oxygen delivered by nasal cannula.  She was sedated with 70 mg IV Demerol and 5 mg IV Versed.  The Olympus video endoscope was advanced under direct vision into the oropharynx and esophagus.  The esohagus was straight and of normal caliber with the squamocolumnar line at 38 cm.  There was a question of a subtle stricture but this could not be confirmed as the  LES did not relax fully during the period of observation and there was no resistance to passage of the scope beyond the LES.  There was no visible hiatal hernia, esophageal ulcer or esophagitis.  The stomach was entered and a small amount of liquid secretions suctioned from the fundus.  Retroflex view of the cardia was unremarkable.  The fundus, body, antrum and pylorus all appeared normal.  The duodenum was entered and both bulb and second portion were well inspected and appeared to be within normal limits.  The Savary guide wire was placed through the endoscope channel and the scope withdrawn.  A single 17 mm Savary dilator was passed under fluoroscopic visualization to moderate resistance with no blood seen on withdrawal.  The dilator was removed together with the wire and the patient returned to the recovery room in stable condition.  The patient tolerated the procedure well.  There were no immediate  complications.  IMPRESSION: 1. Subtle esophageal stricture status post dilatation to 17 mm.  PLAN:  Advance diet and observe response to dilatation. DD:  03/18/00 TD:  03/22/00 Job: [id number]

## 2011-03-05 NOTE — H&P (Signed)
NAME:  [name], [name]                        ACCOUNT NO.:  [id number]   MEDICAL RECORD NO.:  [id number]                   PATIENT TYPE:  INP   LOCATION:  2893                                 FACILITY:  [facility]   PHYSICIAN:  Payton Doughty, M.D.                   DATE OF BIRTH:  Dec 11, 1955   DATE OF ADMISSION:  08/13/2003  DATE OF DISCHARGE:                                HISTORY & PHYSICAL   ADMISSION DIAGNOSIS:  Recurrent herniated disk, L5-S1 on the right.   HISTORY OF PRESENT ILLNESS:  This is a 55 year old right-handed white female  I saw for neck pain a couple of months ago.  She had been doing well, and  had a history of a ruptured disk at L5-S1.  She called back with marked  increase in pain in her back and down her right leg.  MRI was obtained that  demonstrated a recurrent disk at L5-S1 on the right.  Lumbar epidural  steroids were of no avail, and she is now admitted for diskectomy and  fusion.   PAST MEDICAL HISTORY:  1. Migraines.  2. Lumbar disk operation by Dr. Thurston Hole.   MEDICATIONS:  1. Paxil 40 mg daily.  2. Levothroid 0.075 mg daily.  3. Zomig on a p.r.n. basis.  4. Multivitamin daily.  5. Klonopin 0.5 mg a night for sleep.  6. Lortab p.r.n.  7. Robaxin p.r.n.   ALLERGIES:  She is sensitive to [facility].   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   FAMILY HISTORY:  Her mom's history is not given.  Dad is 77, in fair health  with heart disease, stroke, and hypertension.   REVIEW OF SYSTEMS:  Remarkable for indigestion, liver disease, abdominal  pain, arm weakness, arm pain, neck pain, depression, and thyroid disease.   PHYSICAL EXAMINATION:  HEENT:  Within normal limits.  NECK:  She has reasonable range of motion of her neck.  Turning her head  towards the right causes her discomfort in her right arm.  CHEST:  Clear.  CARDIAC:  Regular rate and rhythm.  ABDOMEN:   Nontender with no hepatosplenomegaly.  EXTREMITIES:  No clubbing or cyanosis.  GENITALIA:  Deferred.  NEUROLOGICAL:  Peripheral pulses are good.  Neurologically, she is awake,  alert, and oriented.  Cranial nerves are intact.  Motor examination shows  5/5 strength throughout the upper extremities except for the right triceps  are 4/5.  Sensory deficit is described in the right C7 distribution.  Lower  extremity strength is full except she cannot toe stand on the right.  There  is a right S1 sensory deficit.  Reflexes are 2 at the knees, 2 at the left  ankle, absent on the right, and straight leg raise is positive on the right.   LABORATORY DATA:  MRI results  demonstrate a herniated disk at L5-S1  eccentric to the right with elevation of the right S1 root.   CLINICAL IMPRESSION:  Herniated disk, recurrent, at L5-S1 on the right.  She  has failed conservative measures and is admitted now for redo diskectomy and  fusion.  The risks and benefits of this approach have been discussed with  her and she wishes to proceed.                                                Payton Doughty, M.D.    MWR/MEDQ  D:  08/13/2003  T:  08/13/2003  Job:  [id number]

## 2011-03-05 NOTE — Discharge Summary (Signed)
[name], [name]              ACCOUNT NO.:  [id number]   MEDICAL RECORD NO.:  [id number]          PATIENT TYPE:  INP   LOCATION:  3008                         FACILITY:  [facility]   PHYSICIAN:  Payton Doughty, M.D.      DATE OF BIRTH:  01/27/1956   DATE OF ADMISSION:  07/23/2005  DATE OF DISCHARGE:  07/26/2005                                 DISCHARGE SUMMARY   ADMISSION DIAGNOSIS:  Spondylosis and disk C4-C5 and C5-C6.   DISCHARGE DIAGNOSIS:  Spondylosis and disk C4-C5 and C5-C6.   PROCEDURE:  C4-C5 and C5-C6 anterior cervical decompression and fusion with  a reflex hyper plate.   ATTENDING:  Dr. Trey Sailors.   COMPLICATIONS:  None.  __________   A 55 year old right-handed white girl whose history and physical is  recounted in the chart.  She has had lumbar surgery and has a spinal cord  stimulator in, she has neck, and left shoulder and arm pain, is admitted for  fusion.  Medical history is remarkable for depression.  General exam was  intact.  Neurologic exam demonstrated left C6 radiculopathy.  She was  admitted after __________ and underwent an anterior decompression and fusion  of C4-C5 and C5-C6.  Postoperatively she has done well.  She had a migraine  for a day that is gone now.  Her strength is full, her incision is dry and  well-healing.  Her airway is good and she is eating and voiding normally.  She is being discharged home in the care of her family with Percocet for  pain, and follow-up will be in the [facility] office  in two weeks with x-rays.           ______________________________  Payton Doughty, M.D.     MWR/MEDQ  D:  07/26/2005  T:  07/26/2005  Job:  [id number]

## 2011-03-05 NOTE — Op Note (Signed)
NAMEMarland Nelson  DELIA, SLATTEN NO.:  [id number]   MEDICAL RECORD NO.:  [id number]          PATIENT TYPE:  INP   LOCATION:  3008                         FACILITY:  [facility]   PHYSICIAN:  Payton Doughty, M.D.      DATE OF BIRTH:  02/13/1956   DATE OF PROCEDURE:  07/23/2005  DATE OF DISCHARGE:                                 OPERATIVE REPORT   PREOPERATIVE DIAGNOSIS:  Spondylosis at C4-5 and C5-6.   POSTOPERATIVE DIAGNOSIS:  Spondylosis at C4-5 and C5-6.   OPERATIVE PROCEDURE:  C4-5, C5-6 anterior cervical decompression and fusion  with Reflex hybrid plate.   ANESTHESIA:  General endotracheal.   PREPARATION:  Sterile Betadine prep and scrub with alcohol wipe.   COMPLICATIONS:  None.   SURGEON:  Payton Doughty, M.D.   ASSISTANT:  Danae Orleans. Venetia Maxon, M.D.   BODY OF TEXT:  A 55 year old right-handed white girl with cervical  spondylosis at C4-5 and C5-6.  Taken to the operating room and smoothly  anesthetized and intubated, placed supine on the operating table, placed in  the halter head traction.  Following shave, prep and drape in the usual  sterile fashion, the skin was incised in the midline in the medial border of  the sternocleidomastoid muscle on the left side.  The platysma was  identified, elevated, divided and undermined.  The sternocleidomastoid was  identified and medial dissection revealed the carotid artery, retracted to  the left, trachea and esophagus, retracted laterally to the right.  This  exposed the bones of the anterior cervical spine.  A marker was placed,  intraoperative x-ray obtained to confirm correctness of level.  Having  confirmed correctness of level, the longus colli was taken down bilaterally  and the Shadow Line self-retaining retractor placed.  Diskectomy was carried  out at C4-5 and C5-6 under gross observation.  The operating microscope was  then brought in and we used microdissection technique to remove the  remaining disk and divide the  posterior longitudinal ligament, carefully  exposing the neural foramina bilaterally.  There was spondylosis at each  level, C5-6 somewhat greater than C4-5, a little bit worse off to the right  than off to the left.  Following complete decompression of the nerve roots,  7 mm bone grafts fashioned from patellar allograft and tapped into place at  each level.  A 34 mm plate was then placed with 12 mm screws, two in C4, two  in C5 and two in C6.  Intraoperative x-ray showed good placement of bone  graft, plate and screws.  The wound was irrigated and hemostasis assured.  The platysma and  subcutaneous tissue was reapproximated with 3-0 Vicryl in interrupted  fashion.  The skin was closed with 4-0 Vicryl in a running subcuticular  fashion.  The esophagus was carefully inspected prior to closure and found  to be defect-free.  The patient was then placed in the Aspen collar and  returned to the recovery room in good condition.           ______________________________  Payton Doughty, M.D.     MWR/MEDQ  D:  07/23/2005  T:  07/24/2005  Job:  [id number]

## 2011-03-05 NOTE — Op Note (Signed)
NAMEVELMER, [name] NO.:  [id number]   MEDICAL RECORD NO.:  [id number]          PATIENT TYPE:  OIB   LOCATION:  2899                         FACILITY:  [facility]   PHYSICIAN:  Payton Doughty, M.D.      DATE OF BIRTH:  01/02/1956   DATE OF PROCEDURE:  01/05/2005  DATE OF DISCHARGE:                                 OPERATIVE REPORT   PREOPERATIVE DIAGNOSES:  Presence of spinal cord stimulator.   POSTOPERATIVE DIAGNOSES:  Presence of spinal cord stimulator.   OPERATION:  Placement of spinal cord stimulator pulse generator.   SURGEON:  Payton Doughty, M.D.   SERVICE:  Neurosurgery.   ANESTHESIA:  General endotracheal.   PREPARATION:  Prepped and draped with alcohol wipe.   COMPLICATIONS:  Severing of wire.   ASSISTANT:  None.   A 55 year old, right hand white girl that had a spinal cord stimulator leads  placed a week ago, had good coverage and is now admitted for placement of  generator.   Taken to the operating room, smoothly anesthetized and intubated, placed  prone on the operating table following shave.  Prepped and draped in the  usual sterile fashion. The old skin incision was reopened, the wires were  rapidly mobilized, extension wires removed and divided. The subcutaneous  tunnel was created to the right posterior iliac crest and through a  transverse incision a pocket was prepared for placement of the generator.  While expanding the pocket, one of the wires was inadvertently divided, new  stimulator leads were then placed in the epidural space. X-ray confirmed  they are in exactly the same position as the old ones which were working  fine. Following placement, the __________ devices were attached. Through  another subcutaneous __________ device, the leads were attached to the pulse  generator device which was then placed in a subcutaneous tunnel. Impedance  testing was negative for pathology. The skin was closed in successive layers  of #0 Vicryl, 2-0  Vicryl, and 3-0 nylon. The patient returned to the  recovery room in good condition.      MWR/MEDQ  D:  01/05/2005  T:  01/05/2005  Job:  [id number]

## 2011-03-05 NOTE — Op Note (Signed)
NAMEPATRINA, ANDREAS NO.:  [id number]   MEDICAL RECORD NO.:  [id number]          PATIENT TYPE:  OIB   LOCATION:  2899                         FACILITY:  [facility]   PHYSICIAN:  Payton Doughty, M.D.      DATE OF BIRTH:  12-Jun-1956   DATE OF PROCEDURE:  12/29/2004  DATE OF DISCHARGE:                                 OPERATIVE REPORT   PREOPERATIVE DIAGNOSIS:  Intractable back and leg pain.   POSTOPERATIVE DIAGNOSIS:  Intractable back and leg pain.   OPERATIVE PROCEDURE:  Placement of a spinal cord stimulator.   SURGEON:  Payton Doughty, M.D.   ANESTHESIA:  General endotracheal.   PREPARATION:  Sterile Betadine prep and scrub with alcohol wipe.   COMPLICATIONS:  None.   NURSE ASSISTANT:  [place].   BODY OF TEXT:  A 55 year old girl with intractable back and leg pain.  Taken  to the operating room and smoothly anesthetized and intubated, placed prone  on the operating table.  Following shave, prep and drape in the usual  sterile fashion, a marker was placed and an x-ray obtained to get the  incision in the right place.  The skin was then infiltrated with 1%  lidocaine and 1:400,000 epinephrine.  The skin was incised from mid-T12 to  top of T11, and the laminae of T11 and T12 were exposed bilaterally in the  subperiosteal plane.  Inferior laminectomy of T11 was carried out to allow  access to the posterior epidural space.  The ligamentum flavum was removed.  The stimulating electrode was advanced so that the bottom was positioned at  mid- to slightly lower T10.  The top extended up to T8.  Intraoperative x-  ray showed good placement, and it was symmetrical in the midline.  The wound  was irrigated and hemostasis assured.  Extension wires were attached and  exited by a trocar.  The strain relief devices were attached to T12 using 2-  0 nylon.  The wire was coiled and placed in the laminectomy defect.  The  fascia was reapproximated with 0 Vicryl in interrupted  fashion, the  subcutaneous tissue was reapproximated with 3-0 Vicryl in interrupted  fashion.  The skin was closed with 3-0 nylon in simple running fashion.  Betadine and Telfa dressing was applied and the patient returned to the  recovery room in good condition.      MWR/MEDQ  D:  12/29/2004  T:  12/29/2004  Job:  [id number]

## 2011-03-05 NOTE — H&P (Signed)
[name], [name]              ACCOUNT NO.:  [id number]   MEDICAL RECORD NO.:  [id number]          PATIENT TYPE:  INP   LOCATION:  2860                         FACILITY:  [facility]   PHYSICIAN:  Payton Doughty, M.D.      DATE OF BIRTH:  1955/12/30   DATE OF ADMISSION:  07/23/2005  DATE OF DISCHARGE:                                HISTORY & PHYSICAL   ADMITTING DIAGNOSIS:  Cervical spondylosis, C4-C5 and C5-C6.   SERVICE:  Neurosurgery.   A 55 year old, right handed, white female I have been seeing since 1994 for  neck difficulty.  She has also had spondylosis and a lumbar fusion.  She has  a spinal cord stimulator in and seems to be doing reasonably well with that  but has intractable pain in her neck and down her right shoulder and arm.  MR has demonstrated spondylitic disease at C4-C5 and C5-C6, and she is  admitted for an anterior decompression/fusion.  She has failed traction and  epidural steroids.   MEDICAL HISTORY:  1.  Migraines.  2.  She also had a lumbar disk done in 1990 by Dr. Thurston Hole.  3.  Lumbar fusion done in 2005 by me.   She takes:  1.  Paxil 40 mg a day.  2.  Levothroid 0.075 mg a day.  3.  Zomig on a p.r.n. basis.  4.  Multivitamin a day.  5.  Klonopin 0.5 mg at night for sleep.  6.  Lortab on p.r.n. basis.  7.  Robaxin on a p.r.n. basis.   She is sensitive to [facility].   SOCIAL HISTORY:  She does not smoke.  Drinks on a social basis.  On  disability.   FAMILY HISTORY:  Mom history is not given.  Father is 72 in fair health,  heart disease, stroke, and hypertension.   REVIEW OF SYSTEMS:  __________  liver disease, abdominal pain, arm weakness,  arm pain, neck pain, depression, thyroid disease.   PHYSICAL EXAMINATION:  HEENT:  Within normal limits.  NECK:  She has some limited range of motion in her neck with pain down her  right arm.  CHEST:  Clear.  CARDIAC:  Regular rate and rhythm.  ABDOMEN:  Nontender with no hepatosplenomegaly.  EXTREMITIES:   Without clubbing, cyanosis.  Peripheral pulses are good.  GU:  Deferred.  NEUROLOGIC:  She is awake, alert, and oriented.  Cranial nerves are intact.  Motor exam shows 5/5 strength throughout the upper extremities save for the  right triceps which is 4/5.  Sensory deficit described in right C6-C7  distribution.  Reflexes are 2 at the biceps bilaterally, 2 at the left  triceps, 1 at the right, 1 at the brachial radialis bilaterally.  Hoffmann's  is negative.  Lower extremities are non-myelopathic.   MR demonstrates spondylosis at C4-C5 and C5-C6.   CLINICAL IMPRESSION:  C5 and C6 radiculopathy related to spondylosis.   PLAN:  Anterior decompression fusion C4-C5 and C5-C6.  The risks and  benefits of this approach have been discussed with her and she wishes to  proceed.  ______________________________  Payton Doughty, M.D.     MWR/MEDQ  D:  07/23/2005  T:  07/23/2005  Job:  [id number]

## 2011-03-05 NOTE — Procedures (Signed)
[facility]  Patient:    [name], [name] Visit Number: [id number] MRN: [id number]          Service Type: END Location: ENDO Attending Physician:  Louie Bun Dictated by:   Everardo All Madilyn Fireman, M.D. Proc. Date: 01/10/02 Admit Date:  01/10/2002                             Procedure Report  PROCEDURE:  Esophagogastroduodenoscopy.  INDICATIONS FOR PROCEDURE:  Patient with epigastric abdominal pain, intermittent solid food dysphagia who has responded to esophageal dilatation in the past although there was no definite stricture seen at the time of EGD.  DESCRIPTION OF PROCEDURE:  The patient was placed in the left lateral decubitus position then placed on the pulse monitor with continuous low flow oxygen delivered by nasal cannula. She was sedated with 80 mg IV Demerol and 8 mg IV Versed. The Olympus video endoscope was advanced under direct vision into the oropharynx and esophagus. The esophagus was straight and of normal caliber with the squamocolumnar line at 38 cm. Although I could not clearly discern the stricture or circumferential ring, there did appear to be an eccentric indentation along the right aspect of the lower esophageal sphincter that could have represented incomplete ring with no resistance to passage of the scope beyond it into the stomach. Retroflexed view of the cardia was unremarkable. There was a small amount of pooled clear secretions in the dependent portions of the fundus. The fundus, body, and pylorus all appeared duodenum. The duodenum was entered and there were some discreet patches of erythema and granularity in the duodenal bulb consistent with duodenitis. There were no focal erosions or ulcers. The post bulbar duodenum appeared normal. A Savary guidewire was passed through the endoscope tip and the scope withdrawn. A single 17 mm Savary dilator was passed over the guidewire with mild resistance and no blood seen on  withdrawal. The last dilator was remove together with the wire and the patient returned to the recovery room in stable condition. She tolerated the procedure well and there were no immediate complications.  IMPRESSION:  1. Possible subtle lower esophageal ring or stricture.  2. Duodenitis.  PLAN:  1. Await CLOtest and treat for eradication of Helicobacter if positive.  2. Advance diet and observe response to dilatation in terms of dysphagia     relief. Dictated by:   Everardo All Madilyn Fireman, M.D. Attending Physician:  Louie Bun DD:  01/10/02 TD:  01/11/02 Job: [id number]

## 2011-03-05 NOTE — Discharge Summary (Signed)
NAMEKERRILYNN, [name]              ACCOUNT NO.:  [id number]   MEDICAL RECORD NO.:  [id number]          PATIENT TYPE:  INP   LOCATION:  1503                         [facility]ACILITY:  [facility]   PHYSICIAN:  Rene Paci, M.D. LHCDATE OF BIRTH:  08/02/1956   DATE OF ADMISSION:  09/16/2005  DATE OF DISCHARGE:  09/20/2005                                 DISCHARGE SUMMARY   DISCHARGE DIAGNOSES:  1.  Acute pancolitis secondary to infectious gastroenteritis, nausea,      vomiting, and diarrhea, resolved.  Afebrile with normal white count.      Continue oral antibiotics for a total of 10 days treatment.  2.  Urinary tract infection.  Culture negative.  Antibiotics as above.  3.  Chronic hepatitis C with mild transaminase elevation, status post      previous liver biopsy in 2005 with minimal fibrosis, no cirrhosis.  4.  History of hypothyroidism.  5.  History of chronic pain with neck and back surgeries at baseline.  6.  Depression with history of anxiety.  Continue home medications.  7.  History of migraine headaches.  8.  Mild normocytic anemia.  Discharge hemoglobin 11.6.  Hemodynamically      stable.  No signs of bleeding.   DISCHARGE MEDICATIONS:  1.  Cipro 500 mg b.i.d. x7 days more, to continue 10 day course.  2.  Flagyl 500 mg p.o. t.i.d. x7 days more, to complete a 10 day course.  3.  Other medications are as prior to admission without change and include      Synthroid 100 mcg daily, Cymbalta 60 mg p.o. daily, Xanax 0.5 mg q.h.s.,      Femhrt 0.05/2.5 daily, Lyrica 75 mg p.o. daily, multivitamins 1 daily.   DISPOSITION:  Patient is discharged home, tolerating a p.o. diet and  antibiotics.  She will contact her primary MD next week for routine followup  regarding this hospital admission and otherwise contact sooner if problems  arise before that time.   CONDITION ON DISCHARGE:  Medically stable.   HOSPITAL COURSE:  Problem 1:  Acute colitis:  Patient is a pleasant 55-year-  old woman  disabled from chronic pain due to neck and back issues with  multiple surgeries, who presented to the emergency room on referral from  urgent care secondary to abdominal pain, severe bilateral lower quadrant,  onset that morning.  Associated with nausea and vomiting, followed by  diarrhea.  [facility] done in [facility] emergency room showed pan colitis.  The patient has an elevated white count at 24.  She was admitted for  presumed infectious source of gastroenteritis and begun on IV Cipro and  Flagyl.  Previous colonoscopy in the last four months showed diverticula but  no other inflammatory bowel changes (done by Dr. Arlyce Dice as an outpatient).  Patient responded well to the IV antibiotics.  Her GI symptoms were improved  within 48 hours.  She was changed to p.o. antibiotics, which she also  tolerated well, along with advancing to a regular diet.  Patient is now felt  stable for discharge home.  She has had  normalization  of her white count and resolution of her symptoms.  Continue  antibiotics.  Complete a total 10 day course.  Outpatient followup with GI  p.r.n., otherwise primary MD to be arranged by patient next week.  Problem 2:  Other medical issues:  As listed above.  No changes are made in  her regimen.      Rene Paci, M.D. [facility]  Electronically Signed     VL/MEDQ  D:  09/20/2005  T:  09/20/2005  Job:  [id number]

## 2011-03-05 NOTE — H&P (Signed)
[name], [name] NO.:  [id number]   MEDICAL RECORD NO.:  [id number]          PATIENT TYPE:  OIB   LOCATION:  2899                         FACILITY:  [facility]   PHYSICIAN:  Payton Doughty, M.D.      DATE OF BIRTH:  05/10/1956   DATE OF ADMISSION:  01/05/2005  DATE OF DISCHARGE:                                HISTORY & PHYSICAL   ADMITTING DIAGNOSIS:  Externalized spinal cord stimulator.   A 55 year old right-handed white girl who has had a disk operation, disk  recurrence and fusion and a chronic right L5 radiculopathy and had a spinal  cord stimulator placed last week which seems to be helping her.  She is  admitted now for fusion.   MEDICAL HISTORY:  Remarkable for migraines.  She also had colitis in the  past week and is currently asymptomatic.  She takes Paxil 40 mg daily,  Levothroid 0.075 mg a day, Soma on an as-needed basis, multivitamin,  Klonopin 0.75 mg a night for sleep, Lortab and Robaxin as needed.  She is  ALLERGIC TO IMITREX.   SOCIAL HISTORY:  She does not smoke.  She drinks on a social basis.  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   FAMILY HISTORY:  Mom's history is not given.  Daddy is 37 and in fair  health.  Heart disease, stroke, and hypertension.   REVIEW OF SYSTEMS:  Marked for indigestion, liver disease, abdominal pain,  arm weakness, arm pain, neck pain, depression, and thyroid disease.  Her  HEENT is within normal limits.  She has reasonable range of motion in her  neck.  Chest is clear.  Turning her head towards the right causes discomfort  in the right arm.  Abdomen is nontender.  No hepatosplenomegaly.  Extremities are without clubbing or cyanosis.  Genitourinary exam is  deferred.  Peripheral pulses are good.  Neurologically, she is awake, alert,  and oriented.  Cranial nerves are intact.  Motor exam shows 5/5 strength  throughout the upper and lower extremities save for mild triceps weakness on  the right at 4/5 and  mild dorsiflexion weakness on the right at 4/5.  Straight leg raise is positive on the right.  She has burning pain down the  right leg.  Reflexes are good at the knees, __________ to the ankles.  Toes  are downgoing bilaterally.  She has a well-healed incision site.  MR  demonstrates no significant nerve compression and solid fusion.   CLINICAL IMPRESSION:  Favorable spinal cord stimulator trial.   PLAN:  Installation of the battery pack.  The risks and benefits of this  approach have been discussed with her and she wishes to proceed.      MWR/MEDQ  D:  01/05/2005  T:  01/05/2005  Job:  [id number]

## 2011-04-02 ENCOUNTER — Ambulatory Visit (INDEPENDENT_AMBULATORY_CARE_PROVIDER_SITE_OTHER): Payer: Medicare Other | Admitting: Women's Health

## 2011-04-02 DIAGNOSIS — N898 Other specified noninflammatory disorders of vagina: Secondary | ICD-10-CM

## 2011-04-02 DIAGNOSIS — R350 Frequency of micturition: Secondary | ICD-10-CM

## 2011-04-02 DIAGNOSIS — B373 Candidiasis of vulva and vagina: Secondary | ICD-10-CM

## 2011-04-02 DIAGNOSIS — R82998 Other abnormal findings in urine: Secondary | ICD-10-CM

## 2011-05-10 ENCOUNTER — Other Ambulatory Visit: Payer: Self-pay | Admitting: Internal Medicine

## 2011-05-10 DIAGNOSIS — Z1231 Encounter for screening mammogram for malignant neoplasm of breast: Secondary | ICD-10-CM

## 2011-06-11 ENCOUNTER — Ambulatory Visit
Admission: RE | Admit: 2011-06-11 | Discharge: 2011-06-11 | Disposition: A | Discharge status: Home / Self Care | Payer: Medicare Other | Source: Ambulatory Visit | Attending: Internal Medicine | Admitting: Internal Medicine

## 2011-06-11 DIAGNOSIS — Z1231 Encounter for screening mammogram for malignant neoplasm of breast: Secondary | ICD-10-CM

## 2011-09-15 DIAGNOSIS — B182 Chronic viral hepatitis C: Secondary | ICD-10-CM | POA: Insufficient documentation

## 2011-09-17 ENCOUNTER — Encounter: Payer: Self-pay | Admitting: Women's Health

## 2011-09-17 ENCOUNTER — Ambulatory Visit (INDEPENDENT_AMBULATORY_CARE_PROVIDER_SITE_OTHER): Payer: Medicare Other | Admitting: Women's Health

## 2011-09-17 VITALS — BP 120/70

## 2011-09-17 DIAGNOSIS — L293 Anogenital pruritus, unspecified: Secondary | ICD-10-CM

## 2011-09-17 DIAGNOSIS — N898 Other specified noninflammatory disorders of vagina: Secondary | ICD-10-CM

## 2011-09-17 DIAGNOSIS — B373 Candidiasis of vulva and vagina: Secondary | ICD-10-CM

## 2011-09-17 DIAGNOSIS — N809 Endometriosis, unspecified: Secondary | ICD-10-CM | POA: Insufficient documentation

## 2011-09-17 MED ORDER — TERCONAZOLE 0.8 % VA CREA: 1.0000 | TOPICAL_CREAM | Freq: Every day | VAGINAL | Status: AC

## 2011-09-17 MED ORDER — FLUCONAZOLE 150 MG PO TABS: 150.0000 mg | ORAL_TABLET | Freq: Once | ORAL | Status: AC

## 2011-09-17 NOTE — Progress Notes (Signed)
Patient ID: [name], female   DOB: [birth year], 55 y.o.   MRN: [id number] Presents with a complaint of vaginal itching and discharge. Denies any urinary symptoms.  Exam: External genitalia within normal limits, erythematous at introitus, speculum exam scant white discharge vaginal walls are also erythematous. Bimanual no CMT or adnexal fullness or tenderness.  Wet prep positive for yeast  Plan: Diflucan 150 by mouth x1 dose. If no relief will use Terazol 3 externally where most of her itching and discomfort is. History of chronic hepatitis C.

## 2011-09-23 ENCOUNTER — Telehealth: Payer: Self-pay | Admitting: *Deleted

## 2011-09-23 DIAGNOSIS — B3731 Acute candidiasis of vulva and vagina: Secondary | ICD-10-CM

## 2011-09-23 DIAGNOSIS — B373 Candidiasis of vulva and vagina: Secondary | ICD-10-CM

## 2011-09-23 MED ORDER — TERCONAZOLE 0.4 % VA CREA: 1.0000 | TOPICAL_CREAM | Freq: Every day | VAGINAL | Status: AC

## 2011-09-23 NOTE — Telephone Encounter (Signed)
Telephone call states only about 25-50% better, continues with vaginal itching and burning. States the cream helped more than the pills for yeast symptoms we'll repeat Terazol. Instructed to call if no relief.

## 2011-09-23 NOTE — Telephone Encounter (Signed)
Patient said she was told to call if she continues to have problems with yeast after using Diflucan and Cream prescribed.  She would like to speak.

## 2011-09-23 NOTE — Telephone Encounter (Signed)
Message left to call

## 2011-11-23 ENCOUNTER — Other Ambulatory Visit: Payer: Self-pay | Admitting: Endocrinology

## 2011-11-23 DIAGNOSIS — E049 Nontoxic goiter, unspecified: Secondary | ICD-10-CM

## 2011-11-24 ENCOUNTER — Ambulatory Visit
Admission: RE | Admit: 2011-11-24 | Discharge: 2011-11-24 | Disposition: A | Discharge status: Home / Self Care | Payer: Medicare Other | Source: Ambulatory Visit | Attending: Endocrinology | Admitting: Endocrinology

## 2011-11-24 ENCOUNTER — Other Ambulatory Visit: Payer: Medicare Other

## 2011-11-24 DIAGNOSIS — E049 Nontoxic goiter, unspecified: Secondary | ICD-10-CM

## 2011-11-29 ENCOUNTER — Encounter: Payer: Self-pay | Admitting: Women's Health

## 2011-11-29 ENCOUNTER — Ambulatory Visit (INDEPENDENT_AMBULATORY_CARE_PROVIDER_SITE_OTHER): Payer: Medicare Other | Admitting: Women's Health

## 2011-11-29 DIAGNOSIS — M545 Low back pain, unspecified: Secondary | ICD-10-CM

## 2011-11-29 DIAGNOSIS — Z79890 Hormone replacement therapy (postmenopausal): Secondary | ICD-10-CM

## 2011-11-29 DIAGNOSIS — N898 Other specified noninflammatory disorders of vagina: Secondary | ICD-10-CM

## 2011-11-29 DIAGNOSIS — R350 Frequency of micturition: Secondary | ICD-10-CM

## 2011-11-29 LAB — URINALYSIS W MICROSCOPIC + REFLEX CULTURE
Bacteria, UA: NONE SEEN
Ketones, ur: NEGATIVE mg/dL
Leukocytes, UA: NEGATIVE
Nitrite: NEGATIVE
Protein, ur: NEGATIVE mg/dL

## 2011-11-29 LAB — WET PREP FOR TRICH, YEAST, CLUE
Trich, Wet Prep: NONE SEEN
WBC, Wet Prep HPF POC: NONE SEEN
Yeast Wet Prep HPF POC: NONE SEEN

## 2011-11-29 MED ORDER — ESTRADIOL 0.05 MG/24HR TD PTTW: 1.0000 | MEDICATED_PATCH | TRANSDERMAL | Status: DC

## 2011-11-29 MED ORDER — PROGESTERONE MICRONIZED 200 MG PO CAPS: 200.0000 mg | ORAL_CAPSULE | Freq: Every day | ORAL | Status: DC

## 2011-11-29 NOTE — Progress Notes (Signed)
Patient ID: [name], female   DOB: 1956-01-18, 56 y.o.   MRN: [id number] Presents with the complaint of back discomfort and vaginal irritation, denies fever, had some increased frequency and urgency with urination. Currently being treated for URI per primary care. Was given a prescription for Terazol at primary care. States is also having headaches while on Provera, headaches stop after finishing provera.  Exam: UA negative. No CVAT. External genitalia slightly erythematous, speculum exam scant erythema of vaginal walls, scant discharge, scant menses type blood. Bimanual no CMT or adnexal fullness or tenderness.  Wet prep and UA negative History of recurrent yeast  Plan: Finish out [employer] as prescribed by primary care for vaginal itching. Call if symptoms continue. Prometrium 200 day one through 12, prescription, proper use given and reviewed call if continued headaches. Reviewed importance of taking it at at bedtime. Urine culture pending.

## 2011-11-30 ENCOUNTER — Telehealth: Payer: Self-pay | Admitting: *Deleted

## 2011-11-30 NOTE — Telephone Encounter (Signed)
Pharmacy called to verify directions for the minivelle. It was sent as once a week but directions should read twice a week. Pharmacists was notified that it should be twice a week.

## 2011-12-01 ENCOUNTER — Telehealth: Payer: Self-pay | Admitting: *Deleted

## 2011-12-01 DIAGNOSIS — Z780 Asymptomatic menopausal state: Secondary | ICD-10-CM

## 2011-12-01 NOTE — Telephone Encounter (Signed)
Pt was seen on 2/11 and was given samples of minvelle 0.5 mg, pt said that the patch is $71.00 compared to vivelle dot patch which is $40.00. Pt would like a rx for vivelle dot 0.05 mg since much cheaper. You also gave her samples of minvelle 0.1 mg by accident. Pt wants to bring back to office, I told he not to worry, but she wants to return them. Pt also asked if okay to still take minvelle 0.5mg  since she has samples if she can take vivelle dot 0.5 mg also? Please advise

## 2011-12-02 ENCOUNTER — Telehealth: Payer: Self-pay | Admitting: *Deleted

## 2011-12-02 MED ORDER — ESTRADIOL 0.05 MG/24HR TD PTTW: 1.0000 | MEDICATED_PATCH | TRANSDERMAL | Status: DC

## 2011-12-02 NOTE — Telephone Encounter (Signed)
Telephone call to discuss problem. Will send Vivelle-Dot patch 0.05 to pharmacy due to cost difference. Coupon was not effective.

## 2011-12-02 NOTE — Telephone Encounter (Signed)
Pharmacy called to clarify vivelle -dot patch 0.05 mg. Patch was sent one patch per week and directions should be 1 patch twice a week. [facility] informed with the directions.

## 2012-01-20 ENCOUNTER — Other Ambulatory Visit (HOSPITAL_COMMUNITY)
Admission: RE | Admit: 2012-01-20 | Discharge: 2012-01-20 | Disposition: A | Discharge status: Home / Self Care | Payer: Medicare Other | Source: Ambulatory Visit | Attending: Obstetrics and Gynecology | Admitting: Obstetrics and Gynecology

## 2012-01-20 ENCOUNTER — Ambulatory Visit (INDEPENDENT_AMBULATORY_CARE_PROVIDER_SITE_OTHER): Payer: Medicare Other | Admitting: Women's Health

## 2012-01-20 ENCOUNTER — Encounter: Payer: Self-pay | Admitting: Women's Health

## 2012-01-20 VITALS — BP 128/70 | Ht 66.0 in | Wt 184.0 lb

## 2012-01-20 DIAGNOSIS — Z124 Encounter for screening for malignant neoplasm of cervix: Secondary | ICD-10-CM | POA: Insufficient documentation

## 2012-01-20 DIAGNOSIS — Z79890 Hormone replacement therapy (postmenopausal): Secondary | ICD-10-CM

## 2012-01-20 MED ORDER — ESTRADIOL 0.1 MG/24HR TD PTTW: 1.0000 | MEDICATED_PATCH | TRANSDERMAL | Status: DC

## 2012-01-20 NOTE — Patient Instructions (Signed)

## 2012-01-20 NOTE — Progress Notes (Signed)
[name] 12/04/1955 [id number]    History:    The patient presents for Pap smear, HRT and breast exam. Currently on Vivelle 0.05 twice weekly and Prometrium 200 day one through 12. No bleeding, good relief of symptoms. History of normal Paps and mammograms. Bone density in 2012 normal in all areas other than right femoral neck  T score of -1.1. Last colonoscopy in 2010 with a benign polyp has done Hemoccult cards through primary care office.  Past medical history, past surgical history, family history and social history were all reviewed and documented in the EPIC chart. Has had numerous back and  neck surgeries for degenerative disc disease and is doing better.   ROS:  A  ROS was performed and pertinent positives and negatives are included in the history.  Exam:  Filed Vitals:   01/20/12 1043  BP: 128/70    General appearance:  Normal Head/Neck:  Normal, without cervical or supraclavicular adenopathy. Thyroid:  Symmetrical, normal in size, without palpable masses or nodularity. Respiratory  Effort:  Normal  Auscultation:  Clear without wheezing or rhonchi Cardiovascular  Auscultation:  Regular rate, without rubs, murmurs or gallops  Edema/varicosities:  Not grossly evident Abdominal  Soft,nontender, without masses, guarding or rebound.  Liver/spleen:  No organomegaly noted  Hernia:  None appreciated  Skin  Inspection:  Grossly normal  Palpation:  Grossly normal Neurologic/psychiatric  Orientation:  Normal with appropriate conversation.  Mood/affect:  Normal  Genitourinary    Breasts: Examined lying and sitting.     Right: Without masses, retractions, discharge or axillary adenopathy.     Left: Without masses, retractions, discharge or axillary adenopathy.   Inguinal/mons:  Normal without inguinal adenopathy  External genitalia:  Normal  BUS/Urethra/Skene's glands:  Normal  Bladder:  Normal  Vagina:  Normal  Cervix:  Normal  Uterus:   normal in size, shape and  contour.  Midline and mobile  Adnexa/parametria:     Rt: Without masses or tenderness.   Lt: Without masses or tenderness.  Anus and perineum: Normal  Digital rectal exam: Normal sphincter tone without palpated masses or tenderness  Assessment/Plan:  56 y.o. MWF G1 P1 presents for Pap smear, breast exam and HRT.  Postmenopausal on HRT with no bleeding and good symptom relief Mild osteopenia T score of -1.1 right femoral neck in 2012 History of benign colon polyp 2010 Hypothyroidism/hypercholesteremia/depression/chronic hepatitis C-primary care manages labs and medications  Plan: Vivelle dot  0.05 twice weekly and Prometrium 200 by mouth at bedtime day one through 12, prescription, proper use, slight risk for blood clots and strokes reviewed. SBE's, annual mammogram, calcium rich diet, exercise, vitamin D 2000 daily encouraged. Pap only. Continue vaginal lubricants with intercourse for dryness. Continue care with primary care.    Harrington Challenger WHNP, 1:13 PM 01/20/2012

## 2012-01-26 ENCOUNTER — Ambulatory Visit (INDEPENDENT_AMBULATORY_CARE_PROVIDER_SITE_OTHER): Payer: Medicare Other | Admitting: Women's Health

## 2012-01-26 ENCOUNTER — Other Ambulatory Visit (HOSPITAL_COMMUNITY)
Admission: RE | Admit: 2012-01-26 | Discharge: 2012-01-26 | Disposition: A | Discharge status: Home / Self Care | Payer: Medicare Other | Source: Ambulatory Visit | Attending: Obstetrics and Gynecology | Admitting: Obstetrics and Gynecology

## 2012-01-26 ENCOUNTER — Encounter: Payer: Self-pay | Admitting: Women's Health

## 2012-01-26 DIAGNOSIS — R87616 Satisfactory cervical smear but lacking transformation zone: Secondary | ICD-10-CM

## 2012-01-26 DIAGNOSIS — Z124 Encounter for screening for malignant neoplasm of cervix: Secondary | ICD-10-CM | POA: Insufficient documentation

## 2012-01-26 NOTE — Progress Notes (Signed)
Patient ID: [name], female   DOB: Dec 11, 1955, 56 y.o.   MRN: [id number] Presents for a Pap. An annual exam Pap was normal without endocervical cells. Without complaint today.  Exam: External genitalia within normal limits, speculum exam cervix pink healthy without lesion. Repeat Pap taken,cervical os not stenotic. Will triage based on Pap results.

## 2012-03-21 ENCOUNTER — Other Ambulatory Visit: Payer: Self-pay | Admitting: Endocrinology

## 2012-03-21 DIAGNOSIS — E049 Nontoxic goiter, unspecified: Secondary | ICD-10-CM

## 2012-04-05 ENCOUNTER — Other Ambulatory Visit: Payer: Self-pay | Admitting: Neurosurgery

## 2012-04-05 DIAGNOSIS — M47812 Spondylosis without myelopathy or radiculopathy, cervical region: Secondary | ICD-10-CM

## 2012-04-06 ENCOUNTER — Ambulatory Visit
Admission: RE | Admit: 2012-04-06 | Discharge: 2012-04-06 | Disposition: A | Discharge status: Home / Self Care | Payer: Medicare Other | Source: Ambulatory Visit | Attending: Neurosurgery | Admitting: Neurosurgery

## 2012-04-06 DIAGNOSIS — M47812 Spondylosis without myelopathy or radiculopathy, cervical region: Secondary | ICD-10-CM

## 2012-04-14 ENCOUNTER — Other Ambulatory Visit: Payer: Self-pay | Admitting: Neurosurgery

## 2012-04-14 DIAGNOSIS — M47812 Spondylosis without myelopathy or radiculopathy, cervical region: Secondary | ICD-10-CM

## 2012-04-24 ENCOUNTER — Ambulatory Visit
Admission: RE | Admit: 2012-04-24 | Discharge: 2012-04-24 | Disposition: A | Discharge status: Home / Self Care | Payer: Medicare Other | Source: Ambulatory Visit | Attending: Neurosurgery | Admitting: Neurosurgery

## 2012-04-24 DIAGNOSIS — M47812 Spondylosis without myelopathy or radiculopathy, cervical region: Secondary | ICD-10-CM

## 2012-04-24 MED ORDER — TRIAMCINOLONE ACETONIDE 40 MG/ML IJ SUSP (RADIOLOGY)
60.0000 mg | Freq: Once | INTRAMUSCULAR | Status: AC
  Administered 2012-04-24: 60 mg via EPIDURAL

## 2012-04-24 MED ORDER — IOHEXOL 300 MG/ML  SOLN
1.0000 mL | Freq: Once | INTRAMUSCULAR | Status: AC | PRN
  Administered 2012-04-24: 1 mL via EPIDURAL

## 2012-05-03 ENCOUNTER — Other Ambulatory Visit: Payer: Self-pay | Admitting: Internal Medicine

## 2012-05-03 DIAGNOSIS — Z1231 Encounter for screening mammogram for malignant neoplasm of breast: Secondary | ICD-10-CM

## 2012-05-16 ENCOUNTER — Ambulatory Visit (INDEPENDENT_AMBULATORY_CARE_PROVIDER_SITE_OTHER): Payer: Medicare Other | Admitting: Gynecology

## 2012-05-16 ENCOUNTER — Encounter: Payer: Self-pay | Admitting: Gynecology

## 2012-05-16 VITALS — BP 126/78

## 2012-05-16 DIAGNOSIS — N760 Acute vaginitis: Secondary | ICD-10-CM

## 2012-05-16 DIAGNOSIS — B9689 Other specified bacterial agents as the cause of diseases classified elsewhere: Secondary | ICD-10-CM

## 2012-05-16 DIAGNOSIS — F411 Generalized anxiety disorder: Secondary | ICD-10-CM

## 2012-05-16 DIAGNOSIS — N951 Menopausal and female climacteric states: Secondary | ICD-10-CM

## 2012-05-16 DIAGNOSIS — N898 Other specified noninflammatory disorders of vagina: Secondary | ICD-10-CM

## 2012-05-16 DIAGNOSIS — F419 Anxiety disorder, unspecified: Secondary | ICD-10-CM

## 2012-05-16 DIAGNOSIS — A499 Bacterial infection, unspecified: Secondary | ICD-10-CM

## 2012-05-16 DIAGNOSIS — N950 Postmenopausal bleeding: Secondary | ICD-10-CM

## 2012-05-16 DIAGNOSIS — F32A Depression, unspecified: Secondary | ICD-10-CM

## 2012-05-16 DIAGNOSIS — R300 Dysuria: Secondary | ICD-10-CM

## 2012-05-16 DIAGNOSIS — F329 Major depressive disorder, single episode, unspecified: Secondary | ICD-10-CM

## 2012-05-16 LAB — URINALYSIS W MICROSCOPIC + REFLEX CULTURE
Bilirubin Urine: NEGATIVE
Glucose, UA: NEGATIVE mg/dL
Leukocytes, UA: NEGATIVE
Specific Gravity, Urine: 1.015 (ref 1.005–1.030)
Urobilinogen, UA: 0.2 mg/dL (ref 0.0–1.0)

## 2012-05-16 LAB — WET PREP FOR TRICH, YEAST, CLUE
Trich, Wet Prep: NONE SEEN
Yeast Wet Prep HPF POC: NONE SEEN

## 2012-05-16 MED ORDER — PAROXETINE HCL 10 MG PO TABS: 10.0000 mg | ORAL_TABLET | ORAL | Status: DC

## 2012-05-16 MED ORDER — METRONIDAZOLE 500 MG PO TABS: 500.0000 mg | ORAL_TABLET | Freq: Two times a day (BID) | ORAL | Status: AC

## 2012-05-16 NOTE — Progress Notes (Addendum)
Patient is a 56 year old who presented to the office today stating that last week she had vaginal bleeding like a menstrual cycle for 5 days. She has been on hormone replacement therapy for 13 years on several regimens. Since February this year she has been on Vivelle-Dot 0.1 patch twice a week with the addition of Prometrium 200 mg for the first 12 days of the month. Prior to this episode she not had any vaginal bleeding. She's also complaining today of some vaginal discharge with some slight odor. She also states that her vasomotor symptoms such as night sweats have gotten worse recently. She saw her primary physician early this morning because of her lack of energy lack of appetite and tiredness and fatigue and a battery of blood tests were drawn which results are pending. She also is complaining twice in the past month of migraine headache.  Exam: Bartholin urethra Skene glands: Within normal limits Vagina: Brownish foul odor discharge noted Cervix: No gross lesions on inspection  Wet prep demonstrated moderate amount of white blood cells too numerous to count bacteria clue cells were present as well as positive for Amine  Assessment/plan: To completely evaluate her uterine cavity as a result of her recent abnormal vaginal bleeding on hormone replacement therapy she will need an endometrial biopsy along with a sonohysterogram. We will hold off on this until next week since her wet prep demonstrated BV. She will be started on Flagyl 500 mg one by mouth twice a day for the next 5 days. As to her vasomotor symptoms since she is on the maximum dose of transdermal estrogen we discussed about switching her from her Prozac which she takes for depression anxiety to Paxil which has been proven to help with vasomotor symptoms as well. She'll be started on 5 mg daily and after month increase it to the full 10 mg tablet.Her urinalysis was negative.

## 2012-05-16 NOTE — Patient Instructions (Addendum)
Transvaginal Ultrasound Transvaginal ultrasound is a pelvic ultrasound, using a metal probe that is placed in the vagina, to look at a women's female organs. Transvaginal ultrasound is a method of seeing inside the pelvis of a woman. The ultrasound machine sends out sound waves from the transducer (probe). These sound waves bounce off body structures (like an echo) to create a picture. The picture shows up on a monitor. It is called transvaginal because the probe is inserted into the vagina. There should be very little discomfort from the vaginal probe. This test can also be used during pregnancy. Endovaginal ultrasound is another name for a transvaginal ultrasound. In a transabdominal ultrasound, the probe is placed on the outside of the belly. This method gives pictures that are lower quality than pictures from the transvaginal technique. Transvaginal ultrasound is used to look for problems of the female genital tract. Some such problems include:  Infertility problems.   Congenital (birth defect) malformations of the uterus and ovaries.   Tumors in the uterus.   Abnormal bleeding.   Ovarian tumors and cysts.   Abscess (inflamed tissue around pus) in the pelvis.   Unexplained abdominal or pelvic pain.   Pelvic infection.  DURING PREGNANCY, TRANSVAGINAL ULTRASOUND MAY BE USED TO LOOK AT:  Normal pregnancy.   Ectopic pregnancy (pregnancy outside the uterus).   Fetal heartbeat.   Abnormalities in the pelvis, that are not seen well with transabdominal ultrasound.   Suspected twins or multiples.   Impending miscarriage.   Problems with the cervix (incompetent cervix, not able to stay closed and hold the baby).   When doing an amniocentesis (removing fluid from the pregnancy sac, for testing).   Looking for abnormalities of the baby.   Checking the growth, development, and age of the fetus.   Measuring the amount of fluid in the amniotic sac.   When doing an external version of  the baby (moving baby into correct position).   Evaluating the baby for problems in high risk pregnancies (biophysical profile).   Suspected fetal demise (death).  Sometimes a special ultrasound method called Saline Infusion Sonography (SIS) is used for a more accurate look at the uterus. Sterile saline (salt water) is injected into the uterus of non-pregnant patients to see the inside of the uterus better. SIS is not used on pregnant women. The vaginal probe can also assist in obtaining biopsies of abnormal areas, in draining fluid from cysts on the ovary, and in finding IUDs (intrauterine device, birth control) that cannot be located. PREPARATION FOR TEST A transvaginal ultrasound is done with the bladder empty. The transabdominal ultrasound is done with your bladder full. You may be asked to drink several glasses of water before that exam. Sometimes, a transabdominal ultrasound is done just after a transvaginal ultrasound, to look at organs in your abdomen. PROCEDURE  You will lie down on a table, with your knees bent and your feet in foot holders. The probe is covered with a condom. A sterile lubricant is put into the vagina and on the probe. The lubricant helps transmit the sound waves and avoid irritating the vagina. Your caregiver will move the probe inside the vaginal cavity to scan the pelvic structures. A normal test will show a normal pelvis and normal contents. An abnormal test will show abnormalities of the pelvis, placenta, or baby. ABNORMAL RESULTS MAY BE DUE TO:  Growths or tumors in the:   Uterus.   Ovaries.   Vagina.   Other pelvic structures.   Non-cancerous   growths of the uterus and ovaries.   Twisting of the ovary, cutting off blood supply to the ovary (ovarian torsion).   Areas of infection, including:   Pelvic inflammatory disease.   Abscess in the pelvis.   Locating an IUD.  PROBLEMS FOUND IN PREGNANT WOMEN MAY INCLUDE:  Ectopic pregnancy (pregnancy outside  the uterus).   Multiple pregnancies.   Early dilation (opening) of the cervix. This may indicate an incompetent cervix and early delivery.   Impending miscarriage.   Fetal death.   Problems with the placenta, including:   Placenta has grown over the opening of the womb (placenta previa).   Placenta has separated early in the womb (placental abruption).   Placenta grows into the muscle of the uterus (placenta accreta).   Tumors of pregnancy, including gestational trophoblastic disease. This is an abnormal pregnancy, with no fetus. The uterus is filled with many grape-like cysts that could sometimes be cancerous.   Incorrect position of the fetus (breech, vertex).   Intrauterine fetal growth retardation (IUGR) (poor growth in the womb).   Fetal abnormalities or infection.  RISKS AND COMPLICATIONS There are no known risks to the ultrasound procedure. There is no X-ray used when doing an ultrasound. Document Released: 09/15/2004 Document Revised: 09/23/2011 Document Reviewed: 09/03/2009 [facility] Patient Information 2012 [place], [place].Bacterial Vaginosis Bacterial vaginosis (BV) is a vaginal infection where the normal balance of bacteria in the vagina is disrupted. The normal balance is then replaced by an overgrowth of certain bacteria. There are several different kinds of bacteria that can cause BV. BV is the most common vaginal infection in women of childbearing age. CAUSES   The cause of BV is not fully understood. BV develops when there is an increase or imbalance of harmful bacteria.   Some activities or behaviors can upset the normal balance of bacteria in the vagina and put women at increased risk including:   Having a new sex partner or multiple sex partners.   Douching.   Using an intrauterine device (IUD) for contraception.   It is not clear what role sexual activity plays in the development of BV. However, women that have never had sexual intercourse are rarely  infected with BV.  Women do not get BV from toilet seats, bedding, swimming pools or from touching objects around them.  SYMPTOMS   Grey vaginal discharge.   A fish-like odor with discharge, especially after sexual intercourse.   Itching or burning of the vagina and vulva.   Burning or pain with urination.   Some women have no signs or symptoms at all.  DIAGNOSIS  Your caregiver must examine the vagina for signs of BV. Your caregiver will perform lab tests and look at the sample of vaginal fluid through a microscope. They will look for bacteria and abnormal cells (clue cells), a pH test higher than 4.5, and a positive amine test all associated with BV.  RISKS AND COMPLICATIONS   Pelvic inflammatory disease (PID).   Infections following gynecology surgery.   Developing HIV.   Developing herpes virus.  TREATMENT  Sometimes BV will clear up without treatment. However, all women with symptoms of BV should be treated to avoid complications, especially if gynecology surgery is planned. Female partners generally do not need to be treated. However, BV may spread between female sex partners so treatment is helpful in preventing a recurrence of BV.   BV may be treated with antibiotics. The antibiotics come in either pill or vaginal cream forms. Either can be used  with nonpregnant or pregnant women, but the recommended dosages differ. These antibiotics are not harmful to the baby.   BV can recur after treatment. If this happens, a second round of antibiotics will often be prescribed.   Treatment is important for pregnant women. If not treated, BV can cause a premature delivery, especially for a pregnant woman who had a premature birth in the past. All pregnant women who have symptoms of BV should be checked and treated.   For chronic reoccurrence of BV, treatment with a type of prescribed gel vaginally twice a week is helpful.  HOME CARE INSTRUCTIONS   Finish all medication as directed by your  caregiver.   Do not have sex until treatment is completed.   Tell your sexual partner that you have a vaginal infection. They should see their caregiver and be treated if they have problems, such as a mild rash or itching.   Practice safe sex. Use condoms. Only have 1 sex partner.  PREVENTION  Basic prevention steps can help reduce the risk of upsetting the natural balance of bacteria in the vagina and developing BV:  Do not have sexual intercourse (be abstinent).   Do not douche.   Use all of the medicine prescribed for treatment of BV, even if the signs and symptoms go away.   Tell your sex partner if you have BV. That way, they can be treated, if needed, to prevent reoccurrence.  SEEK MEDICAL CARE IF:   Your symptoms are not improving after 3 days of treatment.   You have increased discharge, pain, or fever.  MAKE SURE YOU:   Understand these instructions.   Will watch your condition.   Will get help right away if you are not doing well or get worse.  FOR MORE INFORMATION  Division of STD Prevention (DSTDP), Centers for Disease Control and Prevention: [employer] American Social Health Association (ASHA): www.ashastd.org  Document Released: 10/04/2005 Document Revised: 09/23/2011 Document Reviewed: 03/27/2009 [facility] Patient Information 2012 [place], [place].

## 2012-05-17 ENCOUNTER — Telehealth: Payer: Self-pay | Admitting: *Deleted

## 2012-05-17 NOTE — Telephone Encounter (Signed)
I do not believe there is any contraindication for what I have read that I would ask her to check with her pharmacist as well.

## 2012-05-17 NOTE — Telephone Encounter (Signed)
Pt was seen on 05/16/12 told to stop taking Prozac 40 mg and start on Paxil 5 mg daily for 1 month then start 10 mg daily. Pt asked if okay to continue taking her trazodone 50 mg giving by her PCP? Pt said it helps her sleep. Please advise

## 2012-05-17 NOTE — Telephone Encounter (Signed)
Pt informed with the below note. She will check with the pharmacist.

## 2012-05-26 ENCOUNTER — Other Ambulatory Visit: Payer: Self-pay | Admitting: Gynecology

## 2012-05-26 ENCOUNTER — Ambulatory Visit (INDEPENDENT_AMBULATORY_CARE_PROVIDER_SITE_OTHER): Payer: Medicare Other

## 2012-05-26 ENCOUNTER — Ambulatory Visit (INDEPENDENT_AMBULATORY_CARE_PROVIDER_SITE_OTHER): Payer: Medicare Other | Admitting: Gynecology

## 2012-05-26 DIAGNOSIS — D259 Leiomyoma of uterus, unspecified: Secondary | ICD-10-CM

## 2012-05-26 DIAGNOSIS — N950 Postmenopausal bleeding: Secondary | ICD-10-CM

## 2012-05-26 MED ORDER — LIDOCAINE HCL (PF) 1 % IJ SOLN
10.0000 mL | Freq: Once | INTRAMUSCULAR | Status: AC
  Administered 2012-05-26: 10 mL

## 2012-05-26 NOTE — Progress Notes (Signed)
Patient is a 56 year old who was seen in the office 05/16/2012 stating that l the previous week she had vaginal bleeding like a menstrual cycle for 5 days. She has been on hormone replacement therapy for 13 years on several regimens. Since February this year she has been on Vivelle-Dot 0.1 patch twice a week with the addition of Prometrium 200 mg for the first 12 days of the month. Patient stated she's had good compliance on hormone replacement therapy. She had recently seen her primary physician because of feeling tired and fatigued and she has informed me that all her lab tests were normal. She does have history of hypothyroidism and has not had to have her dose changed. She presented to the office today for sonohysterogram and endometrial biopsy. It was not done in the last office visit because she had been complaining of vaginal discharge and a wet prep demonstrated evidence of bacterial vaginosis for which she was treated with Flagyl 500 mg twice a day for 5 days.  Procedure note: Ultrasound demonstrated uterus that measured 8.1 x 4.5 x 3.6 cm with endometrial stripe of 3.8 mm. 3 small fibroids were noted and the largest one measuring 13 x 10 mm. The ovaries appeared to be normal otherwise.  The cervix was cleansed with Betadine solution and due to patient's anxiety and discomfort level a paracervical block was undertaken with 1% lidocaine injected into the cervical stroma at the 2 and 4 and 8 and 10:00 position. A single-tooth tenaculum was placed on the anterior cervical lip. A small rubber dilator was introduced into the endocervical canal. A small catheter was introduced into the intrauterine cavity and normal saline was used as the distending media to evaluate the intrauterine cavity. There and sonohysterogram there was no intracavitary defects noted. Following this an endometrial biopsy was done with a separate Pipelle in a sterile fashion and tissue was submitted for histological evaluation. The  single-tooth tenaculum was removed. Patient was given Aleve for discomfort.  Assessment/plan: Patient with postmenopausal irregular bleeding on HRT. Normal recent TSH and a normal ultrasound and sonohysterogram. Endometrial biopsy done today results pending at time of this dictation. Patient has been started on Paxil recently to help with her vasomotor symptoms and she was to taper off the Prozac. She will return to the office to see me in 6 months. In the meantime she will maintain a calender   to document any form of bleeding during that time period. If her symptoms subside we had discussed that the goal is to begin tapering her off the hormone replacement therapy as per the recommendations of the [facility]. In the meantime she will continue on the Vivelle-Dot 0.1 mg transdermal 2 times a week with the addition of the Prometrium 200 mg for the first 12 days of the month. Will notify her with the results of endometrial biopsy when it becomes available. All questions were answered and we will follow accordingly.

## 2012-05-29 ENCOUNTER — Encounter: Payer: Self-pay | Admitting: Gynecology

## 2012-06-05 ENCOUNTER — Encounter: Payer: Self-pay | Admitting: Gynecology

## 2012-06-12 ENCOUNTER — Ambulatory Visit
Admission: RE | Admit: 2012-06-12 | Discharge: 2012-06-12 | Disposition: A | Discharge status: Home / Self Care | Payer: Medicare Other | Source: Ambulatory Visit | Attending: Internal Medicine | Admitting: Internal Medicine

## 2012-06-12 DIAGNOSIS — Z1231 Encounter for screening mammogram for malignant neoplasm of breast: Secondary | ICD-10-CM

## 2012-06-13 ENCOUNTER — Other Ambulatory Visit: Payer: Self-pay | Admitting: *Deleted

## 2012-07-05 ENCOUNTER — Telehealth: Payer: Self-pay | Admitting: *Deleted

## 2012-07-05 MED ORDER — PAROXETINE HCL 20 MG PO TABS: 20.0000 mg | ORAL_TABLET | ORAL | Status: DC

## 2012-07-05 NOTE — Telephone Encounter (Signed)
Pt is currently taking paxil 10 mg tablet and would like to increase if possible. Pt said that her brother cancer has spread to his brain. She is having a hard time with this. Please advise

## 2012-07-05 NOTE — Telephone Encounter (Signed)
Pt informed with the below note. rx sent 

## 2012-07-05 NOTE — Telephone Encounter (Signed)
Tell patient she can increase her Paxil from 10 mg to 20 mg (she can take 2 tablets daily in the morning.

## 2012-07-07 ENCOUNTER — Encounter: Payer: Self-pay | Admitting: Gynecology

## 2012-07-07 ENCOUNTER — Ambulatory Visit (INDEPENDENT_AMBULATORY_CARE_PROVIDER_SITE_OTHER): Payer: Medicare Other | Admitting: Gynecology

## 2012-07-07 VITALS — BP 118/70

## 2012-07-07 DIAGNOSIS — N760 Acute vaginitis: Secondary | ICD-10-CM

## 2012-07-07 DIAGNOSIS — B9689 Other specified bacterial agents as the cause of diseases classified elsewhere: Secondary | ICD-10-CM

## 2012-07-07 DIAGNOSIS — N898 Other specified noninflammatory disorders of vagina: Secondary | ICD-10-CM

## 2012-07-07 DIAGNOSIS — A499 Bacterial infection, unspecified: Secondary | ICD-10-CM

## 2012-07-07 LAB — WET PREP FOR TRICH, YEAST, CLUE: Yeast Wet Prep HPF POC: NONE SEEN

## 2012-07-07 MED ORDER — CLINDAMYCIN PHOSPHATE 2 % VA CREA: 1.0000 | TOPICAL_CREAM | Freq: Every day | VAGINAL | Status: DC

## 2012-07-07 NOTE — Patient Instructions (Signed)
Bacterial Vaginosis Bacterial vaginosis (BV) is a vaginal infection where the normal balance of bacteria in the vagina is disrupted. The normal balance is then replaced by an overgrowth of certain bacteria. There are several different kinds of bacteria that can cause BV. BV is the most common vaginal infection in women of childbearing age. CAUSES   The cause of BV is not fully understood. BV develops when there is an increase or imbalance of harmful bacteria.   Some activities or behaviors can upset the normal balance of bacteria in the vagina and put women at increased risk including:   Having a new sex partner or multiple sex partners.   Douching.   Using an intrauterine device (IUD) for contraception.   It is not clear what role sexual activity plays in the development of BV. However, women that have never had sexual intercourse are rarely infected with BV.  Women do not get BV from toilet seats, bedding, swimming pools or from touching objects around them.  SYMPTOMS   Grey vaginal discharge.   A fish-like odor with discharge, especially after sexual intercourse.   Itching or burning of the vagina and vulva.   Burning or pain with urination.   Some women have no signs or symptoms at all.  DIAGNOSIS  Your caregiver must examine the vagina for signs of BV. Your caregiver will perform lab tests and look at the sample of vaginal fluid through a microscope. They will look for bacteria and abnormal cells (clue cells), a pH test higher than 4.5, and a positive amine test all associated with BV.  RISKS AND COMPLICATIONS   Pelvic inflammatory disease (PID).   Infections following gynecology surgery.   Developing HIV.   Developing herpes virus.  TREATMENT  Sometimes BV will clear up without treatment. However, all women with symptoms of BV should be treated to avoid complications, especially if gynecology surgery is planned. Female partners generally do not need to be treated. However,  BV may spread between female sex partners so treatment is helpful in preventing a recurrence of BV.   BV may be treated with antibiotics. The antibiotics come in either pill or vaginal cream forms. Either can be used with nonpregnant or pregnant women, but the recommended dosages differ. These antibiotics are not harmful to the baby.   BV can recur after treatment. If this happens, a second round of antibiotics will often be prescribed.   Treatment is important for pregnant women. If not treated, BV can cause a premature delivery, especially for a pregnant woman who had a premature birth in the past. All pregnant women who have symptoms of BV should be checked and treated.   For chronic reoccurrence of BV, treatment with a type of prescribed gel vaginally twice a week is helpful.  HOME CARE INSTRUCTIONS   Finish all medication as directed by your caregiver.   Do not have sex until treatment is completed.   Tell your sexual partner that you have a vaginal infection. They should see their caregiver and be treated if they have problems, such as a mild rash or itching.   Practice safe sex. Use condoms. Only have 1 sex partner.  PREVENTION  Basic prevention steps can help reduce the risk of upsetting the natural balance of bacteria in the vagina and developing BV:  Do not have sexual intercourse (be abstinent).   Do not douche.   Use all of the medicine prescribed for treatment of BV, even if the signs and symptoms go away.     Tell your sex partner if you have BV. That way, they can be treated, if needed, to prevent reoccurrence.  SEEK MEDICAL CARE IF:   Your symptoms are not improving after 3 days of treatment.   You have increased discharge, pain, or fever.  MAKE SURE YOU:   Understand these instructions.   Will watch your condition.   Will get help right away if you are not doing well or get worse.  FOR MORE INFORMATION  Division of STD Prevention (DSTDP), Centers for Disease  Control and Prevention: www.cdc.gov/std American Social Health Association (ASHA): www.ashastd.org  Document Released: 10/04/2005 Document Revised: 09/23/2011 Document Reviewed: 03/27/2009 ExitCare Patient Information 2012 [employer]. 

## 2012-07-07 NOTE — Progress Notes (Signed)
Patient presented to the office today complaining of a yellowish-like discharge for the past week with no odor or no pruritus. She denied any frequency or burning with urination. Patient denies any fever chills nausea or vomiting  Exam: Back: No CVA tenderness Abdomen: Soft nontender no rebound or guarding Pelvic: Bartholin urethra Skene was within normal limits Vagina: No lesions slight cream-like discharge Cervix: No lesions or discharge Bimanual exam: No palpable masses or tenderness uterus anteverted normal size shape and consistency Adnexa: No palpable masses or tenderness Rectal exam: Not done  Wet prep demonstrated few clue cells few white blood cells and too numerous to count WBC.  Assessment/plan: Patient with apparent early bacterial vaginosis will be placed on Cleocin vaginal cream to apply each bedtime for 5 days. Review of patient's records indicated she has history of hepatitis C and for many years before that she also been on hormone replacement therapy. I discussed with her that she should discontinue the Vivelle-Dot and Prometrium and we will monitor her symptoms. She fully agrees and would like to discontinue HRT. She scheduled to see me in January and we'll continue to maintain menstrual calendar.

## 2012-08-14 ENCOUNTER — Other Ambulatory Visit: Payer: Self-pay | Admitting: Gynecology

## 2012-09-25 ENCOUNTER — Ambulatory Visit
Admission: RE | Admit: 2012-09-25 | Discharge: 2012-09-25 | Disposition: A | Discharge status: Home / Self Care | Payer: Medicare Other | Source: Ambulatory Visit | Attending: Endocrinology | Admitting: Endocrinology

## 2012-09-25 DIAGNOSIS — E049 Nontoxic goiter, unspecified: Secondary | ICD-10-CM

## 2012-11-06 ENCOUNTER — Encounter: Payer: Self-pay | Admitting: Women's Health

## 2012-11-06 ENCOUNTER — Ambulatory Visit (INDEPENDENT_AMBULATORY_CARE_PROVIDER_SITE_OTHER): Payer: Medicare Other | Admitting: Women's Health

## 2012-11-06 DIAGNOSIS — N898 Other specified noninflammatory disorders of vagina: Secondary | ICD-10-CM

## 2012-11-06 DIAGNOSIS — R300 Dysuria: Secondary | ICD-10-CM

## 2012-11-06 LAB — URINALYSIS W MICROSCOPIC + REFLEX CULTURE
Casts: NONE SEEN
Crystals: NONE SEEN
Glucose, UA: NEGATIVE mg/dL
Hgb urine dipstick: NEGATIVE
Specific Gravity, Urine: 1.01 (ref 1.005–1.030)
pH: 5.5 (ref 5.0–8.0)

## 2012-11-06 LAB — WET PREP FOR TRICH, YEAST, CLUE: Yeast Wet Prep HPF POC: NONE SEEN

## 2012-11-06 MED ORDER — METRONIDAZOLE 0.75 % VA GEL: VAGINAL | Status: DC

## 2012-11-06 MED ORDER — ESTRADIOL 10 MCG VA TABS: 1.0000 | ORAL_TABLET | VAGINAL | Status: DC

## 2012-11-06 NOTE — Progress Notes (Signed)
Patient ID: [name], female   DOB: 01/11/1956, 57 y.o.   MRN: [id number] Presents with complaint of burning with urination and vaginal burning. Denies pain at end of stream of urination, change in frequency or urgency. Denies discharge or fever. Stopped HRT 6 months ago and has had increased hot flushes and vaginal dryness.  Exam: External genitalia extremely erythematous, speculum exam vaginal walls atrophic with dryness. Wet prep positive for clue cells and TNTC bacteria. UA: Trace leukocytes, 0 - 2 WBCs, few bacteria.  Vaginal atrophy BV  Plan: MetroGel vaginal cream 1 applicator at bedtime x5, prescription, proper use given and reviewed. After if symptoms resolve, Vagifem one applicator at bedtime x2 weeks and then twice weekly there after. Reviewed slight risk for blood clots, strokes, breast cancer. Reviewed minimal systemic absorption. Prescription, proper use, sample given. Encouraged vaginal lubricants with intercourse. Urine culture pending.

## 2012-11-08 LAB — URINE CULTURE: Colony Count: 40000

## 2012-12-21 ENCOUNTER — Other Ambulatory Visit: Payer: Self-pay | Admitting: Neurosurgery

## 2012-12-21 DIAGNOSIS — M47817 Spondylosis without myelopathy or radiculopathy, lumbosacral region: Secondary | ICD-10-CM

## 2012-12-25 ENCOUNTER — Ambulatory Visit
Admission: RE | Admit: 2012-12-25 | Discharge: 2012-12-25 | Disposition: A | Discharge status: Home / Self Care | Payer: Medicare Other | Source: Ambulatory Visit | Attending: Neurosurgery | Admitting: Neurosurgery

## 2012-12-25 DIAGNOSIS — M47817 Spondylosis without myelopathy or radiculopathy, lumbosacral region: Secondary | ICD-10-CM

## 2013-01-29 ENCOUNTER — Encounter: Payer: Medicare Other | Admitting: Women's Health

## 2013-01-31 ENCOUNTER — Ambulatory Visit (INDEPENDENT_AMBULATORY_CARE_PROVIDER_SITE_OTHER): Payer: Medicare Other | Admitting: Women's Health

## 2013-01-31 ENCOUNTER — Encounter: Payer: Self-pay | Admitting: Women's Health

## 2013-01-31 VITALS — BP 110/72 | Ht 66.0 in | Wt 188.0 lb

## 2013-01-31 DIAGNOSIS — M899 Disorder of bone, unspecified: Secondary | ICD-10-CM

## 2013-01-31 DIAGNOSIS — M949 Disorder of cartilage, unspecified: Secondary | ICD-10-CM

## 2013-01-31 DIAGNOSIS — M8580 Other specified disorders of bone density and structure, unspecified site: Secondary | ICD-10-CM

## 2013-01-31 NOTE — Progress Notes (Signed)
[name] 02-01-1956 [id number]    History:    The patient presents for breast and pelvic exam. Postmenopausal with no bleeding/no HRT. The Vivelle dot and Prometrium in the past. Had postmenopausal bleeding with a negative sonohysterogram August 2013. No bleeding since. Osteopenia,  DEXA 12/2010 T score of -1.1 at right femoral neck, FRAX 5.3%/0.3%. On Medicare due to numerous back problems. Normal Paps and mammograms. Hypertension labs and medications at primary care. Negative Hemoccult, 2010  benign colon polyp.  Past medical history, past surgical history, family history and social history were all reviewed and documented in the EPIC chart. Involved with granddaughters Addison  6 and Morrie Sheldon 5 both doing well.    Exam:  Filed Vitals:   01/31/13 1110  BP: 110/72    General appearance:  Normal Head/Neck:  Normal, without cervical or supraclavicular adenopathy. Thyroid:  Symmetrical, normal in size, without palpable masses or nodularity. Respiratory  Effort:  Normal  Auscultation:  Clear without wheezing or rhonchi Cardiovascular  Auscultation:  Regular rate, without rubs, murmurs or gallops  Edema/varicosities:  Not grossly evident Abdominal  Soft,nontender, without masses, guarding or rebound.  Liver/spleen:  No organomegaly noted  Hernia:  None appreciated  Skin  Inspection:  Grossly normal  Palpation:  Grossly normal Neurologic/psychiatric  Orientation:  Normal with appropriate conversation.  Mood/affect:  Normal  Genitourinary    Breasts: Examined lying and sitting.     Right: Without masses, retractions, discharge or axillary adenopathy.     Left: Without masses, retractions, discharge or axillary adenopathy.   Inguinal/mons:  Normal without inguinal adenopathy  External genitalia:  Normal  BUS/Urethra/Skene's glands:  Normal  Bladder:  Normal  Vagina:  Normal  Cervix:  Normal  Uterus:   normal in size, shape and contour.  Midline and mobile  Adnexa/parametria:      Rt: Without masses or tenderness.   Lt: Without masses or tenderness.  Anus and perineum: Normal  Digital rectal exam: Normal sphincter tone without palpated masses or tenderness  Assessment/Plan:  57 y.o. M. WF G1P1 for breast and pelvic exam.  Postmenopausal with no bleeding/no HRT doing well Osteopenia T score -1.1 no elevated FRAX risk Hypertension, anxiety/depression, hepatitis C- labs and meds primary care Degenerative disc disease  Plan: Repeat DEXA, will schedule here. Home safety and fall prevention discussed. Vaginal lubricants as needed with intercourse, calcium rich diet, vitamin D 2000 daily encouraged. Exercise as able, walking, water aerobics encouraged. SBE's, continue annual mammogram. Pap normal 2013, new screening guidelines reviewed.    Har[facility], 12:42 PM 01/31/2013

## 2013-01-31 NOTE — Patient Instructions (Addendum)

## 2013-02-12 ENCOUNTER — Other Ambulatory Visit: Payer: Self-pay | Admitting: Gynecology

## 2013-02-12 DIAGNOSIS — M8580 Other specified disorders of bone density and structure, unspecified site: Secondary | ICD-10-CM

## 2013-03-01 ENCOUNTER — Ambulatory Visit (INDEPENDENT_AMBULATORY_CARE_PROVIDER_SITE_OTHER): Payer: Medicare Other

## 2013-03-01 DIAGNOSIS — M8580 Other specified disorders of bone density and structure, unspecified site: Secondary | ICD-10-CM

## 2013-03-01 DIAGNOSIS — M899 Disorder of bone, unspecified: Secondary | ICD-10-CM

## 2013-03-18 ENCOUNTER — Emergency Department (HOSPITAL_COMMUNITY): Payer: Medicare Other

## 2013-03-18 ENCOUNTER — Encounter (HOSPITAL_COMMUNITY): Payer: Self-pay | Admitting: Radiology

## 2013-03-18 ENCOUNTER — Emergency Department (HOSPITAL_COMMUNITY)
Admission: EM | Admit: 2013-03-18 | Discharge: 2013-03-18 | Disposition: A | Discharge status: Home / Self Care | Payer: Medicare Other | Attending: Emergency Medicine | Admitting: Emergency Medicine

## 2013-03-18 DIAGNOSIS — Z8742 Personal history of other diseases of the female genital tract: Secondary | ICD-10-CM | POA: Insufficient documentation

## 2013-03-18 DIAGNOSIS — R05 Cough: Secondary | ICD-10-CM | POA: Insufficient documentation

## 2013-03-18 DIAGNOSIS — R0682 Tachypnea, not elsewhere classified: Secondary | ICD-10-CM | POA: Insufficient documentation

## 2013-03-18 DIAGNOSIS — I10 Essential (primary) hypertension: Secondary | ICD-10-CM | POA: Insufficient documentation

## 2013-03-18 DIAGNOSIS — Z79899 Other long term (current) drug therapy: Secondary | ICD-10-CM | POA: Insufficient documentation

## 2013-03-18 DIAGNOSIS — R059 Cough, unspecified: Secondary | ICD-10-CM | POA: Insufficient documentation

## 2013-03-18 DIAGNOSIS — E119 Type 2 diabetes mellitus without complications: Secondary | ICD-10-CM | POA: Insufficient documentation

## 2013-03-18 DIAGNOSIS — IMO0002 Reserved for concepts with insufficient information to code with codable children: Secondary | ICD-10-CM | POA: Insufficient documentation

## 2013-03-18 DIAGNOSIS — F419 Anxiety disorder, unspecified: Secondary | ICD-10-CM

## 2013-03-18 DIAGNOSIS — Z8619 Personal history of other infectious and parasitic diseases: Secondary | ICD-10-CM | POA: Insufficient documentation

## 2013-03-18 DIAGNOSIS — M949 Disorder of cartilage, unspecified: Secondary | ICD-10-CM | POA: Insufficient documentation

## 2013-03-18 DIAGNOSIS — R0602 Shortness of breath: Secondary | ICD-10-CM | POA: Insufficient documentation

## 2013-03-18 DIAGNOSIS — F411 Generalized anxiety disorder: Secondary | ICD-10-CM | POA: Insufficient documentation

## 2013-03-18 DIAGNOSIS — R0789 Other chest pain: Secondary | ICD-10-CM | POA: Insufficient documentation

## 2013-03-18 DIAGNOSIS — Z791 Long term (current) use of non-steroidal anti-inflammatories (NSAID): Secondary | ICD-10-CM | POA: Insufficient documentation

## 2013-03-18 DIAGNOSIS — E079 Disorder of thyroid, unspecified: Secondary | ICD-10-CM | POA: Insufficient documentation

## 2013-03-18 DIAGNOSIS — M899 Disorder of bone, unspecified: Secondary | ICD-10-CM | POA: Insufficient documentation

## 2013-03-18 DIAGNOSIS — Z9889 Other specified postprocedural states: Secondary | ICD-10-CM | POA: Insufficient documentation

## 2013-03-18 LAB — BASIC METABOLIC PANEL
CO2: 27 mEq/L (ref 19–32)
Chloride: 100 mEq/L (ref 96–112)
Glucose, Bld: 93 mg/dL (ref 70–99)
Potassium: 3.4 mEq/L — ABNORMAL LOW (ref 3.5–5.1)
Sodium: 138 mEq/L (ref 135–145)

## 2013-03-18 LAB — CBC
Hemoglobin: 13.2 g/dL (ref 12.0–15.0)
Platelets: 217 10*3/uL (ref 150–400)
RBC: 4.65 MIL/uL (ref 3.87–5.11)
WBC: 8 10*3/uL (ref 4.0–10.5)

## 2013-03-18 LAB — POCT I-STAT TROPONIN I: Troponin i, poc: 0.01 ng/mL (ref 0.00–0.08)

## 2013-03-18 LAB — D-DIMER, QUANTITATIVE: D-Dimer, Quant: <0.27 ug/mL-FEU (ref 0.00–0.48)

## 2013-03-18 MED ORDER — SODIUM CHLORIDE 0.9 % IV SOLN
Freq: Once | INTRAVENOUS | Status: AC
  Administered 2013-03-18: 500 mL via INTRAVENOUS

## 2013-03-18 MED ORDER — IOHEXOL 350 MG/ML SOLN
100.0000 mL | Freq: Once | INTRAVENOUS | Status: AC | PRN
  Administered 2013-03-18: 100 mL via INTRAVENOUS

## 2013-03-18 MED ORDER — ALBUTEROL SULFATE (5 MG/ML) 0.5% IN NEBU
2.5000 mg | INHALATION_SOLUTION | Freq: Once | RESPIRATORY_TRACT | Status: AC
  Administered 2013-03-18: 2.5 mg via RESPIRATORY_TRACT
  Filled 2013-03-18: qty 1

## 2013-03-18 MED ORDER — CLONAZEPAM 0.5 MG PO TABS
0.5000 mg | ORAL_TABLET | Freq: Once | ORAL | Status: AC
  Administered 2013-03-18: 0.5 mg via ORAL
  Filled 2013-03-18: qty 1

## 2013-03-18 MED ORDER — KETOROLAC TROMETHAMINE 30 MG/ML IJ SOLN
30.0000 mg | Freq: Once | INTRAMUSCULAR | Status: AC
  Administered 2013-03-18: 30 mg via INTRAVENOUS
  Filled 2013-03-18: qty 1

## 2013-03-18 NOTE — ED Notes (Signed)
Patient transported to CT 

## 2013-03-18 NOTE — ED Notes (Signed)
Pt was seen by pcp 2 weeks ago chest xray was negative. Pcp felt like it was allergies. Pt given inhaler and prednisone. Pt call provider 1 week ago and pt was called in another inhaler and pred pack. Pt denies fever or chills. Pt states she has been extremely tired this past week. Husband at bedside.

## 2013-03-18 NOTE — ED Provider Notes (Signed)
History     CSN: [id number]  Arrival date & time 03/18/13  1440   First MD Initiated Contact with Patient 03/18/13 1503      Chief Complaint  Patient presents with  . Shortness of Breath    (Consider location/radiation/quality/duration/timing/severity/associated sxs/prior treatment) HPI Comments: 57 year old female with a past medical history of hypertension, diabetes and thyroid disease presents to the emergency department with her husband complaining of continuing shortness of breath x2 weeks. Patient states she was seen by her primary care physician 2 weeks back due to shortness of breath, was given prednisone shot along with an inhaler without any relief. Told her symptoms were due to allergies. 5 days ago she called her doctor to tell him that there was no improvement, he called her in 5 more days of prednisone and a "different" inhaler which also provided no relief. Shortness of breath present only on exertion, relieved by rest. Admits to associated dry cough, worse in the morning. Admits to associated nonradiating left-sided chest pain described as a tight prickly feeling, worse when taking a deep breath in, rated 5/10. She has not had any alleviating factors for this pain. Denies ever having pain like this in the past. Denies extremity edema or calf pain. No nausea or vomiting. No recent long car rides or plane. She is not on any exogenous estrogen. Currently a nonsmoker, however has a 30-pack-year history. Her symptoms are causing her anxiety and has Klonopin at home, however has not had one since last night.  Patient is a 57 y.o. female presenting with shortness of breath. The history is provided by the patient and the spouse.  Shortness of Breath Associated symptoms: chest pain and cough   Associated symptoms: no abdominal pain, no diaphoresis, no headaches, no neck pain, no vomiting and no wheezing     Past Medical History  Diagnosis Date  . Endometriosis 1980's    Dr. Randell Patient  .  Osteopenia   . Thyroid disease   . Hepatitis     genotype 1b hepatitis C virus, monitored by [facility] infectious disease [facility]  . Hypertension   . Diabetes mellitus 1-13    BORDERLINE DR. [facility]    Past Surgical History  Procedure Laterality Date  . Breast surgery  1983    left breast cyst removed  . Cholecystectomy  ?2002  . Laparoscopic endometriosis fulguration  1980's    Dr. Randell Patient  . Lumbar hemilaminectomy  01/1989    L5-S1 on right, Dr. Shela Commons  . Lumbar disc surgery  08/13/2003    Diskectomy/fusion L5-S1 rt., Dr. Marnee Spring  . Spinal cord stimulator insertion  12/29/2004    leads placed along spine from T8 to T10, Dr. Marnee Spring  . Spinal cord stimulator pulse generator  01/05/2005    placed in right upper buttocks, Dr. Marnee Spring  . Anterior cervical decompression and fusion with a reflex hyper plate  55/04/3219    C4-C5 and C5-C6 , Dr. Marnee Spring  . Cervical fusion w/oasis system infuse  05/13/2006    C4-C5 and C5-C6 and C6-7,  Dr. Marnee Spring  . Nasal sinus surgery  10/30/2007    Left upper Sinus lift,  Dr. Clemetine Marker  . Esophageal dilation      Dr. Madilyn Fireman  . Liver biopsy  2002    Family History  Problem Relation Age of Onset  . Hypertension Father   . Cancer Father     colon, liver  . Hypertension Sister   . Hypertension Brother   .  Cancer Brother     lung, adrenal glands and brain  . Hypertension Brother     History  Substance Use Topics  . Smoking status: Never Smoker   . Smokeless tobacco: Never Used  . Alcohol Use: Yes     Comment: occ    OB History   Grav Para Term Preterm Abortions TAB SAB Ect Mult Living   1 1        1       Review of Systems  Constitutional: Negative for chills, diaphoresis and fatigue.  HENT: Negative for neck pain and neck stiffness.   Eyes: Negative for visual disturbance.  Respiratory: Positive for cough, chest tightness and shortness of breath. Negative for wheezing.   Cardiovascular: Positive for chest pain. Negative for leg swelling.   Gastrointestinal: Negative for nausea, vomiting and abdominal pain.  Musculoskeletal: Negative for back pain.  Neurological: Negative for weakness and headaches.  Psychiatric/Behavioral: Negative for confusion.  All other systems reviewed and are negative.    Allergies  Duloxetine; Sumatriptan; Pentazocine lactate; Acetic acid-oxyquinoline; Codeine phosphate; Morphine and related; and Oxycodone-acetaminophen  Home Medications   Current Outpatient Rx  Name  Route  Sig  Dispense  Refill  . calcium carbonate (OS-CAL) 600 MG TABS   Oral   Take 600 mg by mouth 2 (two) times daily with a meal.           . cholecalciferol (VITAMIN D) 1000 UNITS tablet   Oral   Take 1,000 Units by mouth daily.           . clonazePAM (KLONOPIN) 0.5 MG tablet   Oral   Take 0.5 mg by mouth 2 (two) times daily as needed.         Marland Kitchen FLUoxetine (PROZAC) 20 MG capsule   Oral   Take 20 mg by mouth daily.         . fluticasone (FLONASE) 50 MCG/ACT nasal spray   Nasal   Place 2 sprays into the nose daily.         Marland Kitchen levothyroxine (SYNTHROID, LEVOTHROID) 137 MCG tablet   Oral   Take 137 mcg by mouth daily before breakfast.         . lisinopril-hydrochlorothiazide (PRINZIDE,ZESTORETIC) 20-12.5 MG per tablet   Oral   Take 1 tablet by mouth daily.         . meloxicam (MOBIC) 7.5 MG tablet   Oral   Take 7.5 mg by mouth. 2 qd          . Multiple Vitamin (MULTIVITAMIN) tablet   Oral   Take 1 tablet by mouth daily.           Marland Kitchen omeprazole (PRILOSEC) 20 MG capsule   Oral   Take 20 mg by mouth 2 (two) times daily.           . polyethylene glycol (MIRALAX / GLYCOLAX) packet   Oral   Take 17 g by mouth as needed.           . rizatriptan (MAXALT) 10 MG tablet   Oral   Take 10 mg by mouth as needed. May repeat in 2 hours if needed          . traZODone (DESYREL) 50 MG tablet   Oral   Take 50 mg by mouth. 1/2 - 1 prn qhs          . vitamin C (ASCORBIC ACID) 500 MG tablet    Oral   Take 500 mg by mouth  2 (two) times daily.             BP 117/69  Pulse 70  Temp(Src) 97.8 F (36.6 C) (Oral)  Resp 22  SpO2 98%  LMP 06/24/2012  Physical Exam  Nursing note and vitals reviewed. Constitutional: She is oriented to person, place, and time. She appears well-developed and well-nourished. No distress.  HENT:  Head: Normocephalic and atraumatic.  Mouth/Throat: Oropharynx is clear and moist.  Eyes: Conjunctivae and EOM are normal. Pupils are equal, round, and reactive to light.  Neck: Normal range of motion. Neck supple. No JVD present.  Cardiovascular: Normal rate, regular rhythm, normal heart sounds and intact distal pulses.   No extremity edema.  Pulmonary/Chest: Breath sounds normal. Tachypnea noted. No respiratory distress. She has no decreased breath sounds. She has no wheezes. She has no rhonchi. She has no rales. She exhibits no tenderness.  Abdominal: Soft. Bowel sounds are normal. She exhibits no distension. There is no tenderness.  Musculoskeletal: Normal range of motion. She exhibits no edema.  Neurological: She is alert and oriented to person, place, and time. She has normal strength. No cranial nerve deficit or sensory deficit.  Skin: Skin is warm and dry. She is not diaphoretic.  Psychiatric: Her behavior is normal. Her mood appears anxious.    ED Course  Procedures (including critical care time)  Labs Reviewed  BASIC METABOLIC PANEL - Abnormal; Notable for the following:    Potassium 3.4 (*)    GFR calc non Af Amer 68 (*)    GFR calc Af Amer 79 (*)    All other components within normal limits  CBC  D-DIMER, QUANTITATIVE  POCT I-STAT TROPONIN I    Date: 03/18/2013  Rate: 73  Rhythm: normal sinus rhythm  QRS Axis: normal  Intervals: normal  ST/T Wave abnormalities: normal  Conduction Disutrbances:none  Narrative Interpretation: no stemi, no changes since EKG obtained 01/01/2005  Old EKG Reviewed: unchanged   Dg Chest 2  View  03/18/2013   *RADIOLOGY REPORT*  Clinical Data: Shortness of breath and chest discomfort. Hypertension and diabetic.  CHEST - 2 VIEW  Comparison: 08/02/2006  Findings: Dorsal spinal canal stimulator.  Lower cervical spine fixation.  Midline trachea.  Normal heart size and mediastinal contours. No pleural effusion or pneumothorax.  Clear lungs.  IMPRESSION: No acute cardiopulmonary disease.   Original Report Authenticated By: Jeronimo Greaves, M.D.   Ct Angio Chest Pe W/cm &/or Wo Cm  03/18/2013   *RADIOLOGY REPORT*  Clinical Data: Shortness of breath for 1 week.  History of hypertension and diabetes.  Question acute pulmonary embolism.  CT ANGIOGRAPHY CHEST  Technique:  Multidetector CT imaging of the chest using the standard protocol during bolus administration of intravenous contrast. Multiplanar reconstructed images including MIPs were obtained and reviewed to evaluate the vascular anatomy.  Contrast: OMNIPAQUE IOHEXOL 350 MG/ML SOLN  Comparison: Chest radiographs 03/18/2013.  Findings: The pulmonary arteries are well opacified with contrast. There is no evidence of acute pulmonary embolism.  The mediastinum is imaged prior to opacification of the thoracic aorta.  No aneurysm or displaced intimal calcification is identified.  There are no enlarged mediastinal or hilar lymph nodes.  There is no pleural or pericardial effusion.  A small hiatal hernia is noted.  There is mild asymmetric enlargement of the left thyroid lobe without apparent dominant lesion.  The lungs are clear aside from minimal dependent atelectasis bilaterally.  There is no endobronchial lesion.  The visualized upper abdomen is notable for  slight biliary prominence status post cholecystectomy.  This is grossly unchanged from a prior ultrasound done in 2011.  Thoracic spinal stimulator and postsurgical changes from lower cervical fusion are noted.  IMPRESSION:  1.  No evidence of acute pulmonary embolism or other acute chest findings. 2.   Mild biliary dilatation status post cholecystectomy, likely physiologic.   Original Report Authenticated By: Carey Bullocks, M.D.     1. Shortness of breath   2. Anxiety       MDM   57 year old female with shortness of breath and pleuritic chest pain. She is tachypnea. Currently getting an albuterol breathing treatment without any relief. She appears in no apparent distress. Vitals stable. She has a history of smoking, however no other significant risk factors for cardiac disease. Obtaining EKG, chest x-ray, CBC, BMP, troponin and d-dimer to rule out pulmonary embolism. She does not want anything for pain at this time. Klonopin for anxiety. 5:26 PM D-dimer, troponin negative. Labs unremarkable. CXR clear. Patient still reporting SOB. She seems anxious. Case discussed with Dr. Juleen China who also evaluated patient. Had stress test 2 months ago which was normal. Walked patient to check pulse ox, 98-100% throughout the whole time, however had sharp, tight non-radiating left sided chest pain rated 8/10 with associated lightheadedness and dizziness, resolved when laying back in bed. Will obtain CTA in the event d-dimer was false negative. Toradol for pain. 7:52 PM CTA unremarkable. No evidence of pulmonary embolism. Patient is resting comfortably in exam bed and in no apparent distress. Toradol is beginning to relieve her pain. I do not feel as if her symptoms are cardiac or pulmonary related, as she has been very anxious. She calmed down a little after receiving Klonopin. She will followup with her primary care physician this week. She is stable for discharge. Dr. Juleen China agreeable with plan. Return precautions discussed. Patient states understanding of plan and is agreeable.   Trevor Mace, PA-C 03/18/13 1953

## 2013-03-18 NOTE — ED Notes (Signed)
Pt has shortness of breath times 1 week has been seen by her primary care provider. Pt not feeling better. Some chest discomfort.

## 2013-03-19 NOTE — ED Provider Notes (Signed)
Medical screening examination/treatment/procedure(s) were performed by non-physician practitioner and as supervising physician I was immediately available for consultation/collaboration.  Santresa Levett, MD 03/19/13 1548 

## 2013-05-14 ENCOUNTER — Other Ambulatory Visit: Payer: Self-pay

## 2013-05-14 DIAGNOSIS — Z1231 Encounter for screening mammogram for malignant neoplasm of breast: Secondary | ICD-10-CM

## 2013-06-13 ENCOUNTER — Ambulatory Visit: Payer: Medicare Other

## 2013-06-21 ENCOUNTER — Ambulatory Visit
Admission: RE | Admit: 2013-06-21 | Discharge: 2013-06-21 | Disposition: A | Discharge status: Home / Self Care | Payer: Medicare Other | Source: Ambulatory Visit

## 2013-06-21 DIAGNOSIS — Z1231 Encounter for screening mammogram for malignant neoplasm of breast: Secondary | ICD-10-CM

## 2013-07-24 ENCOUNTER — Ambulatory Visit (INDEPENDENT_AMBULATORY_CARE_PROVIDER_SITE_OTHER): Payer: Medicare Other | Admitting: Women's Health

## 2013-07-24 DIAGNOSIS — N898 Other specified noninflammatory disorders of vagina: Secondary | ICD-10-CM

## 2013-07-24 DIAGNOSIS — R300 Dysuria: Secondary | ICD-10-CM

## 2013-07-24 LAB — URINALYSIS W MICROSCOPIC + REFLEX CULTURE
Bilirubin Urine: NEGATIVE
Casts: NONE SEEN
Glucose, UA: NEGATIVE mg/dL
Protein, ur: NEGATIVE mg/dL
RBC / HPF: NONE SEEN RBC/hpf (ref <3)
pH: 7 (ref 5.0–8.0)

## 2013-07-24 LAB — WET PREP FOR TRICH, YEAST, CLUE
Trich, Wet Prep: NONE SEEN
Yeast Wet Prep HPF POC: NONE SEEN

## 2013-07-24 NOTE — Progress Notes (Signed)
Patient ID: [name], female   DOB: Apr 16, 1956, 57 y.o.   MRN: [id number]  Presents with vaginal burning and itching x 1 week.  Vaginal yellow/ green discharge x 1 day after one dose of left over Terazol cream.  History of yeast/ bacterial infections.  Denies odor, abdominal/ pelvic pain. Not sexually active x 6 months due to painful intercourse/vaginal dryness.  Tried lubricants with no relief.  Diabetes well-maintained, lab work done last week.   Exam:  External genitalia erythematous with slight atrophy.  Introitus erythematous.  Wet Prep done with Q-tip, negative .UA trace leukocytes, 3-6 WBCs.  Postmenopausal vaginal atrophy/ dryness Vaginal burning  Plan:  options reviewed Vagifem sample, prescription, proper use given and reviewed slight risk for blood clots, strokes, minimal systemic absorption.  Encourage lubricants as needed for intercourse. Urine culture pending.   Call or return if no symptom improvement.

## 2013-07-26 LAB — URINE CULTURE: Organism ID, Bacteria: NO GROWTH

## 2013-08-06 ENCOUNTER — Telehealth: Payer: Self-pay | Admitting: *Deleted

## 2013-08-06 NOTE — Telephone Encounter (Signed)
Tell her that I cannot treat her with not seeing her. There is a product that patient can apply externally for chronic feminine discomfort called NeoGyn she can check with her pharmacy to see if they have it or she can call [phone] . She can apply when necessary.

## 2013-08-06 NOTE — Telephone Encounter (Signed)
Pt saw nancy on 07/24/13 c/o vaginal burning and burning with urination. U/a culture negative, for vaginal burning nancy told pt to use bert's bees cream, she tired this and had no relief. Pt is calling today with vaginal burning only, she asked if you could give her something for vaginal burning? Please advise

## 2013-08-06 NOTE — Telephone Encounter (Signed)
Pt informed with the below note. 

## 2013-08-07 ENCOUNTER — Encounter: Payer: Self-pay | Admitting: Women's Health

## 2013-08-07 ENCOUNTER — Ambulatory Visit (INDEPENDENT_AMBULATORY_CARE_PROVIDER_SITE_OTHER): Payer: Medicare Other | Admitting: Women's Health

## 2013-08-07 DIAGNOSIS — A499 Bacterial infection, unspecified: Secondary | ICD-10-CM

## 2013-08-07 DIAGNOSIS — B9689 Other specified bacterial agents as the cause of diseases classified elsewhere: Secondary | ICD-10-CM

## 2013-08-07 DIAGNOSIS — N898 Other specified noninflammatory disorders of vagina: Secondary | ICD-10-CM

## 2013-08-07 DIAGNOSIS — N760 Acute vaginitis: Secondary | ICD-10-CM

## 2013-08-07 LAB — WET PREP FOR TRICH, YEAST, CLUE

## 2013-08-07 MED ORDER — METRONIDAZOLE 0.75 % VA GEL: VAGINAL | Status: DC

## 2013-08-07 NOTE — Progress Notes (Signed)
Patient ID: [name], female   DOB: October 30, 1955, 57 y.o.   MRN: [id number] Presents with complaint of severe vaginal burning. Has stopped oral estrogen, history of hepatitis C. Had tried Vagifem for 2 weeks with no relief of symptoms. Minimal discharge, denies odor, itching, urinary symptoms or fever.  Exam: Appears uncomfortable. External genitalia extremely erythematous at introitus, atrophic. Wet prep done with a Q-tip. Wet prep positive for amines, clues, many bacteria.  Bacteria vaginosis Atrophic vaginitis  Plan: MetroGel vaginal cream 1 applicator at bedtime x5. Estrace vaginal cream sample given to use externally twice weekly after completing MetroGel. Instructed to call if no relief of symptoms.

## 2013-08-07 NOTE — Patient Instructions (Signed)
Bacterial Vaginosis Bacterial vaginosis (BV) is a vaginal infection where the normal balance of bacteria in the vagina is disrupted. The normal balance is then replaced by an overgrowth of certain bacteria. There are several different kinds of bacteria that can cause BV. BV is the most common vaginal infection in women of childbearing age. CAUSES   The cause of BV is not fully understood. BV develops when there is an increase or imbalance of harmful bacteria.  Some activities or behaviors can upset the normal balance of bacteria in the vagina and put women at increased risk including:  Having a new sex partner or multiple sex partners.  Douching.  Using an intrauterine device (IUD) for contraception.  It is not clear what role sexual activity plays in the development of BV. However, women that have never had sexual intercourse are rarely infected with BV. Women do not get BV from toilet seats, bedding, swimming pools or from touching objects around them.  SYMPTOMS   Grey vaginal discharge.  A fish-like odor with discharge, especially after sexual intercourse.  Itching or burning of the vagina and vulva.  Burning or pain with urination.  Some women have no signs or symptoms at all. DIAGNOSIS  Your caregiver must examine the vagina for signs of BV. Your caregiver will perform lab tests and look at the sample of vaginal fluid through a microscope. They will look for bacteria and abnormal cells (clue cells), a pH test higher than 4.5, and a positive amine test all associated with BV.  RISKS AND COMPLICATIONS   Pelvic inflammatory disease (PID).  Infections following gynecology surgery.  Developing HIV.  Developing herpes virus. TREATMENT  Sometimes BV will clear up without treatment. However, all women with symptoms of BV should be treated to avoid complications, especially if gynecology surgery is planned. Female partners generally do not need to be treated. However, BV may spread  between female sex partners so treatment is helpful in preventing a recurrence of BV.   BV may be treated with antibiotics. The antibiotics come in either pill or vaginal cream forms. Either can be used with nonpregnant or pregnant women, but the recommended dosages differ. These antibiotics are not harmful to the baby.  BV can recur after treatment. If this happens, a second round of antibiotics will often be prescribed.  Treatment is important for pregnant women. If not treated, BV can cause a premature delivery, especially for a pregnant woman who had a premature birth in the past. All pregnant women who have symptoms of BV should be checked and treated.  For chronic reoccurrence of BV, treatment with a type of prescribed gel vaginally twice a week is helpful. HOME CARE INSTRUCTIONS   Finish all medication as directed by your caregiver.  Do not have sex until treatment is completed.  Tell your sexual partner that you have a vaginal infection. They should see their caregiver and be treated if they have problems, such as a mild rash or itching.  Practice safe sex. Use condoms. Only have 1 sex partner. PREVENTION  Basic prevention steps can help reduce the risk of upsetting the natural balance of bacteria in the vagina and developing BV:  Do not have sexual intercourse (be abstinent).  Do not douche.  Use all of the medicine prescribed for treatment of BV, even if the signs and symptoms go away.  Tell your sex partner if you have BV. That way, they can be treated, if needed, to prevent reoccurrence. SEEK MEDICAL CARE IF:     Your symptoms are not improving after 3 days of treatment.  You have increased discharge, pain, or fever. MAKE SURE YOU:   Understand these instructions.  Will watch your condition.  Will get help right away if you are not doing well or get worse. FOR MORE INFORMATION  Division of STD Prevention (DSTDP), Centers for Disease Control and Prevention:  www.cdc.gov/std [employer] Association (ASHA): www.ashastd.org  Document Released: 10/04/2005 Document Revised: 12/27/2011 Document Reviewed: 03/27/2009 Exit[employer] Patient Information 2014 [employer].  

## 2013-08-07 NOTE — Addendum Note (Signed)
Addended by: Bertram Savin A on: 08/07/2013 04:21 PM   Modules accepted: Orders

## 2013-11-27 ENCOUNTER — Ambulatory Visit
Admission: RE | Admit: 2013-11-27 | Discharge: 2013-11-27 | Disposition: A | Discharge status: Home / Self Care | Payer: Medicare Other | Source: Ambulatory Visit | Attending: Neurosurgery | Admitting: Neurosurgery

## 2013-11-27 ENCOUNTER — Other Ambulatory Visit: Payer: Self-pay | Admitting: Neurosurgery

## 2013-11-27 DIAGNOSIS — M79601 Pain in right arm: Secondary | ICD-10-CM

## 2013-11-28 ENCOUNTER — Other Ambulatory Visit: Payer: Self-pay | Admitting: Endocrinology

## 2013-11-28 DIAGNOSIS — E049 Nontoxic goiter, unspecified: Secondary | ICD-10-CM

## 2014-01-09 ENCOUNTER — Encounter: Payer: Self-pay | Admitting: Gynecology

## 2014-01-09 ENCOUNTER — Ambulatory Visit (INDEPENDENT_AMBULATORY_CARE_PROVIDER_SITE_OTHER): Payer: Medicare Other | Admitting: Gynecology

## 2014-01-09 DIAGNOSIS — N898 Other specified noninflammatory disorders of vagina: Secondary | ICD-10-CM

## 2014-01-09 DIAGNOSIS — N762 Acute vulvitis: Secondary | ICD-10-CM

## 2014-01-09 DIAGNOSIS — N760 Acute vaginitis: Secondary | ICD-10-CM

## 2014-01-09 LAB — URINALYSIS W MICROSCOPIC + REFLEX CULTURE
BILIRUBIN URINE: NEGATIVE
GLUCOSE, UA: NEGATIVE mg/dL
HGB URINE DIPSTICK: NEGATIVE
KETONES UR: NEGATIVE mg/dL
LEUKOCYTES UA: NEGATIVE
Nitrite: NEGATIVE
PH: 5 (ref 5.0–8.0)
Protein, ur: NEGATIVE mg/dL
Specific Gravity, Urine: 1.015 (ref 1.005–1.030)
Urobilinogen, UA: 0.2 mg/dL (ref 0.0–1.0)

## 2014-01-09 LAB — WET PREP FOR TRICH, YEAST, CLUE
Clue Cells Wet Prep HPF POC: NONE SEEN
Trich, Wet Prep: NONE SEEN
YEAST WET PREP: NONE SEEN

## 2014-01-09 MED ORDER — METRONIDAZOLE 500 MG PO TABS: 500.0000 mg | ORAL_TABLET | Freq: Two times a day (BID) | ORAL | Status: DC

## 2014-01-09 MED ORDER — FLUCONAZOLE 200 MG PO TABS: 200.0000 mg | ORAL_TABLET | Freq: Every day | ORAL | Status: DC

## 2014-01-09 NOTE — Patient Instructions (Signed)
Take Flagyl medication twice daily for 7 days. Avoid alcohol while taking. Take Diflucan pill daily for 5 days. Followup if symptoms persist, worsen or recur.  Fluconazole tablets What is this medicine? FLUCONAZOLE (floo KON na zole) is an antifungal medicine. It is used to treat certain kinds of fungal or yeast infections. This medicine may be used for other purposes; ask your health care provider or pharmacist if you have questions. COMMON BRAND NAME(S): Diflucan What should I tell my health care provider before I take this medicine? They need to know if you have any of these conditions: -electrolyte abnormalities -history of irregular heart beat -kidney disease -an unusual or allergic reaction to fluconazole, other azole antifungals, medicines, foods, dyes, or preservatives -pregnant or trying to get pregnant -breast-feeding How should I use this medicine? Take this medicine by mouth. Follow the directions on the prescription label. Do not take your medicine more often than directed. Talk to your pediatrician regarding the use of this medicine in children. Special care may be needed. This medicine has been used in children as young as 34 months of age. Overdosage: If you think you have taken too much of this medicine contact a poison control center or emergency room at once. NOTE: This medicine is only for you. Do not share this medicine with others. What if I miss a dose? If you miss a dose, take it as soon as you can. If it is almost time for your next dose, take only that dose. Do not take double or extra doses. What may interact with this medicine? Do not take this medicine with any of the following medications: -astemizole -certain medicines for irregular heart beat like dofetilide, dronedarone, quinidine -cisapride -erythromycin -lomitapide -other medicines that prolong the QT interval (cause an abnormal heart  rhythm) -pimozide -terfenadine -thioridazine -tolvaptan -ziprasidone  This medicine may also interact with the following medications: -antiviral medicines for HIV or AIDS -birth control pills -certain antibiotics like rifabutin, rifampin -certain medicines for blood pressure like amlodipine, isradipine, felodipine, hydrochlorothiazide, losartan, nifedipine -certain medicines for cancer like cyclophosphamide, vinblastine, vincristine -certain medicines for cholesterol like atorvastatin, lovastatin, fluvastatin, simvastatin -certain medicines for depression, anxiety, or psychotic disturbances like amitriptyline, midazolam, nortriptyline, triazolam -certain medicines for diabetes like glipizide, glyburide, tolbutamide -certain medicines for pain like alfentanil, fentanyl, methadone -certain medicines for seizures like carbamazepine, phenytoin -certain medicines that treat or prevent blood clots like warfarin -halofantrine -medicines that lower your chance of fighting infection like cyclosporine, prednisone, tacrolimus -NSAIDS, medicines for pain and inflammation, like celecoxib, diclofenac, flurbiprofen, ibuprofen, meloxicam, naproxen -other medicines for fungal infections -sirolimus -theophylline -tofacitinib This list may not describe all possible interactions. Give your health care provider a list of all the medicines, herbs, non-prescription drugs, or dietary supplements you use. Also tell them if you smoke, drink alcohol, or use illegal drugs. Some items may interact with your medicine. What should I watch for while using this medicine? Visit your doctor or health care professional for regular checkups. If you are taking this medicine for a long time you may need blood work. Tell your doctor if your symptoms do not improve. Some fungal infections need many weeks or months of treatment to cure. Alcohol can increase possible damage to your liver. Avoid alcoholic drinks. If you have a  vaginal infection, do not have sex until you have finished your treatment. You can wear a sanitary napkin. Do not use tampons. Wear freshly washed cotton, not synthetic, panties. What side effects may I notice from receiving  this medicine? Side effects that you should report to your doctor or health care professional as soon as possible: -allergic reactions like skin rash or itching, hives, swelling of the lips, mouth, tongue, or throat -dark urine -feeling dizzy or faint -irregular heartbeat or chest pain -redness, blistering, peeling or loosening of the skin, including inside the mouth -trouble breathing -unusual bruising or bleeding -vomiting -yellowing of the eyes or skin  Side effects that usually do not require medical attention (report to your doctor or health care professional if they continue or are bothersome): -changes in how food tastes -diarrhea -headache -stomach upset or nausea This list may not describe all possible side effects. Call your doctor for medical advice about side effects. You may report side effects to FDA at 1-800-FDA-1088. Where should I keep my medicine? Keep out of the reach of children. Store at room temperature below 30 degrees C (86 degrees F). Throw away any medicine after the expiration date. NOTE: This sheet is a summary. It may not cover all possible information. If you have questions about this medicine, talk to your doctor, pharmacist, or health care provider.  2014, Elsevier/Gold Standard. (2013-05-12 16:13:04)   Metronidazole tablets or capsules What is this medicine? METRONIDAZOLE (me troe NI da zole) is an antiinfective. It is used to treat certain kinds of bacterial and protozoal infections. It will not work for colds, flu, or other viral infections. This medicine may be used for other purposes; ask your health care provider or pharmacist if you have questions. COMMON BRAND NAME(S): Flagyl What should I tell my health care provider before  I take this medicine? They need to know if you have any of these conditions: -anemia or other blood disorders -disease of the nervous system -fungal or yeast infection -if you drink alcohol containing drinks -liver disease -seizures -an unusual or allergic reaction to metronidazole, or other medicines, foods, dyes, or preservatives -pregnant or trying to get pregnant -breast-feeding How should I use this medicine? Take this medicine by mouth with a full glass of water. Follow the directions on the prescription label. Take your medicine at regular intervals. Do not take your medicine more often than directed. Take all of your medicine as directed even if you think you are better. Do not skip doses or stop your medicine early. Talk to your pediatrician regarding the use of this medicine in children. Special care may be needed. Overdosage: If you think you have taken too much of this medicine contact a poison control center or emergency room at once. NOTE: This medicine is only for you. Do not share this medicine with others. What if I miss a dose? If you miss a dose, take it as soon as you can. If it is almost time for your next dose, take only that dose. Do not take double or extra doses. What may interact with this medicine? Do not take this medicine with any of the following medications: -alcohol or any product that contains alcohol -amprenavir oral solution -cisapride -disulfiram -dofetilide -dronedarone -paclitaxel injection -pimozide -ritonavir oral solution -sertraline oral solution -sulfamethoxazole-trimethoprim injection -thioridazine -ziprasidone This medicine may also interact with the following medications: -cimetidine -lithium -other medicines that prolong the QT interval (cause an abnormal heart rhythm) -phenobarbital -phenytoin -warfarin This list may not describe all possible interactions. Give your health care provider a list of all the medicines, herbs,  non-prescription drugs, or dietary supplements you use. Also tell them if you smoke, drink alcohol, or use illegal drugs. Some items may  interact with your medicine. What should I watch for while using this medicine? Tell your doctor or health care professional if your symptoms do not improve or if they get worse. You may get drowsy or dizzy. Do not drive, use machinery, or do anything that needs mental alertness until you know how this medicine affects you. Do not stand or sit up quickly, especially if you are an older patient. This reduces the risk of dizzy or fainting spells. Avoid alcoholic drinks while you are taking this medicine and for three days afterward. Alcohol may make you feel dizzy, sick, or flushed. If you are being treated for a sexually transmitted disease, avoid sexual contact until you have finished your treatment. Your sexual partner may also need treatment. What side effects may I notice from receiving this medicine? Side effects that you should report to your doctor or health care professional as soon as possible: -allergic reactions like skin rash or hives, swelling of the face, lips, or tongue -confusion, clumsiness -difficulty speaking -discolored or sore mouth -dizziness -fever, infection -numbness, tingling, pain or weakness in the hands or feet -trouble passing urine or change in the amount of urine -redness, blistering, peeling or loosening of the skin, including inside the mouth -seizures -unusually weak or tired -vaginal irritation, dryness, or discharge Side effects that usually do not require medical attention (report to your doctor or health care professional if they continue or are bothersome): -diarrhea -headache -irritability -metallic taste -nausea -stomach pain or cramps -trouble sleeping This list may not describe all possible side effects. Call your doctor for medical advice about side effects. You may report side effects to FDA at  1-800-FDA-1088. Where should I keep my medicine? Keep out of the reach of children. Store at room temperature below 25 degrees C (77 degrees F). Protect from light. Keep container tightly closed. Throw away any unused medicine after the expiration date. NOTE: This sheet is a summary. It may not cover all possible information. If you have questions about this medicine, talk to your doctor, pharmacist, or health care provider.  2014, Elsevier/Gold Standard. (2013-04-03 14:08:26)

## 2014-01-09 NOTE — Progress Notes (Signed)
[name] 07-18-1956 [id number]        57 y.o.  G1P1 presents with history of vaginal irritation and slight discharge. There is a lot of burning with a little bit of bleeding with wiping. Used sample of Terazol she had home. Symptoms continued and she used a sample of MetroGel that she had at home. Burning and irritation seems to be getting worse. No urinary symptoms of frequency dysuria urgency. No pelvic pain low back pain fever or chills.  Past medical history,surgical history, problem list, medications, allergies, family history and social history were all reviewed and documented in the EPIC chart.  Exam: Kim assistant General appearance  Normal Abdomen soft nontender without masses guarding or rebound Pelvic external BUS vagina with atrophic changes. Generalized vulvitis with skin cracking along both creases of the labia minora. Cervix atrophic. Uterus normal size midline mobile nontender. Adnexa without masses or tenderness.  Assessment/Plan:  58 y.o. G1P1 with partially treated vulvitis. Used both Terazol and MetroGel at home. Wet prep is unremarkable in urinalysis is negative. We'll treat more aggressively and cover both possibilities with Flagyl 500 mg twice a day x7 days, alcohol avoidance reviewed and Diflucan 200 mg daily x5 days. Followup if symptoms persist, worsen or recur.   Note: This document was prepared with digital dictation and possible smart phrase technology. Any transcriptional errors that result from this process are unintentional.   Anastasio Auerbach MD, 2:58 PM 01/09/2014

## 2014-02-18 ENCOUNTER — Encounter: Payer: Self-pay | Admitting: Gynecology

## 2014-02-18 ENCOUNTER — Ambulatory Visit (INDEPENDENT_AMBULATORY_CARE_PROVIDER_SITE_OTHER): Payer: Medicare Other | Admitting: Gynecology

## 2014-02-18 VITALS — BP 124/86

## 2014-02-18 DIAGNOSIS — R300 Dysuria: Secondary | ICD-10-CM

## 2014-02-18 DIAGNOSIS — N949 Unspecified condition associated with female genital organs and menstrual cycle: Secondary | ICD-10-CM

## 2014-02-18 DIAGNOSIS — N9489 Other specified conditions associated with female genital organs and menstrual cycle: Secondary | ICD-10-CM

## 2014-02-18 DIAGNOSIS — N952 Postmenopausal atrophic vaginitis: Secondary | ICD-10-CM | POA: Insufficient documentation

## 2014-02-18 DIAGNOSIS — B373 Candidiasis of vulva and vagina: Secondary | ICD-10-CM

## 2014-02-18 DIAGNOSIS — B3731 Acute candidiasis of vulva and vagina: Secondary | ICD-10-CM

## 2014-02-18 LAB — URINALYSIS W MICROSCOPIC + REFLEX CULTURE
Glucose, UA: NEGATIVE mg/dL
Hgb urine dipstick: NEGATIVE
KETONES UR: 15 mg/dL — AB
Leukocytes, UA: NEGATIVE
NITRITE: NEGATIVE
Protein, ur: 30 mg/dL — AB
SPECIFIC GRAVITY, URINE: 1.02 (ref 1.005–1.030)
Urobilinogen, UA: 1 mg/dL (ref 0.0–1.0)
pH: 6.5 (ref 5.0–8.0)

## 2014-02-18 LAB — WET PREP FOR TRICH, YEAST, CLUE
CLUE CELLS WET PREP: NONE SEEN
Trich, Wet Prep: NONE SEEN

## 2014-02-18 MED ORDER — CLOBETASOL PROPIONATE 0.05 % EX CREA: 1.0000 "application " | TOPICAL_CREAM | Freq: Two times a day (BID) | CUTANEOUS | Status: DC

## 2014-02-18 MED ORDER — NONFORMULARY OR COMPOUNDED ITEM: Status: DC

## 2014-02-18 MED ORDER — FLUCONAZOLE 150 MG PO TABS: 150.0000 mg | ORAL_TABLET | Freq: Once | ORAL | Status: DC

## 2014-02-18 NOTE — Progress Notes (Signed)
   Patient is a 58 year old with history of hepatitis C presented to the office today complaining of vulvovaginal irritation and burning as well as discomfort during urination. She also states she had a slight vaginal discharge. Patient is on no hormone replacement therapy. She does complain of dyspareunia and vaginal dryness in the past. She has been treated numerous times in the past either BV or yeast.  Exam: Pelvic exam: Excoriated areas were noted between the labia minora and labia majora on both sides and very irritated and tender to touch. On speculum exam white discharge was noted and a wet prep was done.  Wet prep demonstrated evidence of yeast and her urinalysis was negative.  Assessment/plan: Vulvovaginitis attributed to yeast she will be prescribed Diflucan 150 mg one by mouth today. For irritated external genitalia she's going to be placed on clobetasol  0.05% that I would like her to apply twice today for the first week followed by once a day for the second week in the third week of apply it 3 times in that week. I am going to start her also on vaginal estrogen low dose to apply intravaginally twice a week for her severe vaginal atrophy. We are going to admit her urine for culture. Risks benefits and pros and cons of vaginal estrogen were discussed all questions are answered will follow accordingly.

## 2014-02-18 NOTE — Patient Instructions (Signed)
Monilial Vaginitis Vaginitis in a soreness, swelling and redness (inflammation) of the vagina and vulva. Monilial vaginitis is not a sexually transmitted infection. CAUSES  Yeast vaginitis is caused by yeast (candida) that is normally found in your vagina. With a yeast infection, the candida has overgrown in number to a point that upsets the chemical balance. SYMPTOMS   White, thick vaginal discharge.  Swelling, itching, redness and irritation of the vagina and possibly the lips of the vagina (vulva).  Burning or painful urination.  Painful intercourse. DIAGNOSIS  Things that may contribute to monilial vaginitis are:  Postmenopausal and virginal states.  Pregnancy.  Infections.  Being tired, sick or stressed, especially if you had monilial vaginitis in the past.  Diabetes. Good control will help lower the chance.  Birth control pills.  Tight fitting garments.  Using bubble bath, feminine sprays, douches or deodorant tampons.  Taking certain medications that kill germs (antibiotics).  Sporadic recurrence can occur if you become ill. TREATMENT  Your caregiver will give you medication.  There are several kinds of anti monilial vaginal creams and suppositories specific for monilial vaginitis. For recurrent yeast infections, use a suppository or cream in the vagina 2 times a week, or as directed.  Anti-monilial or steroid cream for the itching or irritation of the vulva may also be used. Get your caregiver's permission.  Painting the vagina with methylene blue solution may help if the monilial cream does not work.  Eating yogurt may help prevent monilial vaginitis. HOME CARE INSTRUCTIONS   Finish all medication as prescribed.  Do not have sex until treatment is completed or after your caregiver tells you it is okay.  Take warm sitz baths.  Do not douche.  Do not use tampons, especially scented ones.  Wear cotton underwear.  Avoid tight pants and panty  hose.  Tell your sexual partner that you have a yeast infection. They should go to their caregiver if they have symptoms such as mild rash or itching.  Your sexual partner should be treated as well if your infection is difficult to eliminate.  Practice safer sex. Use condoms.  Some vaginal medications cause latex condoms to fail. Vaginal medications that harm condoms are:  Cleocin cream.  Butoconazole (Femstat).  Terconazole (Terazol) vaginal suppository.  Miconazole (Monistat) (may be purchased over the counter). SEEK MEDICAL CARE IF:   You have a temperature by mouth above 102 F (38.9 C).  The infection is getting worse after 2 days of treatment.  The infection is not getting better after 3 days of treatment.  You develop blisters in or around your vagina.  You develop vaginal bleeding, and it is not your menstrual period.  You have pain when you urinate.  You develop intestinal problems.  You have pain with sexual intercourse. Document Released: 07/14/2005 Document Revised: 12/27/2011 Document Reviewed: 03/28/2009 [facility] Patient Information 2014 [place], [place]. Hormone Therapy At menopause, your body begins making less estrogen and progesterone hormones. This causes the body to stop having menstrual periods. This is because estrogen and progesterone hormones control your periods and menstrual cycle. A lack of estrogen may cause symptoms such as:  Hot flushes (or hot flashes).  Vaginal dryness.  Dry skin.  Loss of sex drive.  Risk of bone loss (osteoporosis). When this happens, you may choose to take hormone therapy to get back the estrogen lost during menopause. When the hormone estrogen is given alone, it is usually referred to as ET (Estrogen Therapy). When the hormone progestin is combined with  estrogen, it is generally called HT (Hormone Therapy). This was formerly known as hormone replacement therapy (HRT). Your caregiver can help you make a decision on  what will be best for you. The decision to use HT seems to change often as new studies are done. Many studies do not agree on the benefits of hormone replacement therapy. LIKELY BENEFITS OF HT INCLUDE PROTECTION FROM:  Hot Flushes (also called hot flashes) - A hot flush is a sudden feeling of heat that spreads over the face and body. The skin may redden like a blush. It is connected with sweats and sleep disturbance. Women going through menopause may have hot flushes a few times a month or several times per day depending on the woman.  Osteoporosis (bone loss)- Estrogen helps guard against bone loss. After menopause, a woman's bones slowly lose calcium and become weak and brittle. As a result, bones are more likely to break. The hip, wrist, and spine are affected most often. Hormone therapy can help slow bone loss after menopause. Weight bearing exercise and taking calcium with vitamin D also can help prevent bone loss. There are also medications that your caregiver can prescribe that can help prevent osteoporosis.  Vaginal Dryness - Loss of estrogen causes changes in the vagina. Its lining may become thin and dry. These changes can cause pain and bleeding during sexual intercourse. Dryness can also lead to infections. This can cause burning and itching. (Vaginal estrogen treatment can help relieve pain, itching, and dryness.)  Urinary Tract Infections are more common after menopause because of lack of estrogen. Some women also develop urinary incontinence because of low estrogen levels in the vagina and bladder.  Possible other benefits of estrogen include a positive effect on mood and short-term memory in women. RISKS AND COMPLICATIONS  Using estrogen alone without progesterone causes the lining of the uterus to grow. This increases the risk of lining of the uterus (endometrial) cancer. Your caregiver should give another hormone called progestin if you have a uterus.  Women who take combined  (estrogen and progestin) HT appear to have an increased risk of breast cancer. The risk appears to be small, but increases throughout the time that HT is taken.  Combined therapy also makes the breast tissue slightly denser which makes it harder to read mammograms (breast X-rays).  Combined, estrogen and progesterone therapy can be taken together every day, in which case there may be spotting of blood. HT therapy can be taken cyclically in which case you will have menstrual periods. Cyclically means HT is taken for a set amount of days, then not taken, then this process is repeated.  HT may increase the risk of stroke, heart attack, breast cancer and forming blood clots in your leg.  Transdermal estrogen (estrogen that is absorbed through the skin with a patch or a cream) may have more positive results with:  Cholesterol.  Blood pressure.  Blood clots. Having the following conditions may indicate you should not have HT:  Endometrial cancer.  Liver disease.  Breast cancer.  Heart disease.  History of blood clots.  Stroke. TREATMENT   If you choose to take HT and have a uterus, usually estrogen and progestin are prescribed.  Your caregiver will help you decide the best way to take the medications.  Possible ways to take estrogen include:  Pills.  Patches.  Gels.  Sprays.  Vaginal estrogen cream, rings and tablets.  It is best to take the lowest dose possible that will help your  symptoms and take them for the shortest period of time that you can.  Hormone therapy can help relieve some of the problems (symptoms) that affect women at menopause. Before making a decision about HT, talk to your caregiver about what is best for you. Be well informed and comfortable with your decisions. HOME CARE INSTRUCTIONS   Follow your caregivers advice when taking the medications.  A Pap test is done to screen for cervical cancer.  The first Pap test should be done at age  30.  Between ages 25 and 34, Pap tests are repeated every 2 years.  Beginning at age 55, you are advised to have a Pap test every 3 years as long as your past 3 Pap tests have been normal.  Some women have medical problems that increase the chance of getting cervical cancer. Talk to your caregiver about these problems. It is especially important to talk to your caregiver if a new problem develops soon after your last Pap test. In these cases, your caregiver may recommend more frequent screening and Pap tests.  The above recommendations are the same for women who have or have not gotten the vaccine for HPV (Human Papillomavirus).  If you had a hysterectomy for a problem that was not a cancer or a condition that could lead to cancer, then you no longer need Pap tests. However, even if you no longer need a Pap test, a regular exam is a good idea to make sure no other problems are starting.   If you are between ages 34 and 32, and you have had normal Pap tests going back 10 years, you no longer need Pap tests. However, even if you no longer need a Pap test, a regular exam is a good idea to make sure no other problems are starting.   If you have had past treatment for cervical cancer or a condition that could lead to cancer, you need Pap tests and screening for cancer for at least 20 years after your treatment.  If Pap tests have been discontinued, risk factors (such as a new sexual partner) need to be re-assessed to determine if screening should be resumed.  Some women may need screenings more often if they are at high risk for cervical cancer.  Get mammograms done as per the advice of your caregiver. SEEK IMMEDIATE MEDICAL CARE IF:  You develop abnormal vaginal bleeding.  You have pain or swelling in your legs, shortness of breath, or chest pain.  You develop dizziness or headaches.  You have lumps or changes in your breasts or armpits.  You have slurred speech.  You develop  weakness or numbness of your arms or legs.  You have pain, burning, or bleeding when urinating.  You develop abdominal pain. Document Released: 07/03/2003 Document Revised: 12/27/2011 Document Reviewed: 10/21/2010 [facility] Patient Information 2014 [place], [place].

## 2014-02-20 ENCOUNTER — Other Ambulatory Visit: Payer: Self-pay | Admitting: Gynecology

## 2014-02-20 ENCOUNTER — Telehealth: Payer: Self-pay

## 2014-02-20 LAB — URINE CULTURE: Colony Count: 50000

## 2014-02-20 MED ORDER — NITROFURANTOIN MONOHYD MACRO 100 MG PO CAPS: 100.0000 mg | ORAL_CAPSULE | Freq: Two times a day (BID) | ORAL | Status: DC

## 2014-02-20 MED ORDER — FLUCONAZOLE 150 MG PO TABS: ORAL_TABLET | ORAL | Status: DC

## 2014-02-20 NOTE — Telephone Encounter (Signed)
Please call and Diflucan 150 mg 1 by mouth every other day for 3 days #3

## 2014-02-20 NOTE — Telephone Encounter (Signed)
Rx sent 

## 2014-02-20 NOTE — Telephone Encounter (Signed)
Patient asked if you would call her Diflucan in for yeast since she is taking antibiotic. She said she will need more than one tablet. Rx?

## 2014-02-20 NOTE — Telephone Encounter (Signed)
Message copied by Ramond Craver on Wed Feb 20, 2014 11:08 AM ------      Message from: Terrance Mass      Created: Wed Feb 20, 2014  7:19 AM       Please inform patient she has a UTI. Call in Rx for Macrobid one PO BID for 7 days ------

## 2014-03-22 ENCOUNTER — Ambulatory Visit
Admission: RE | Admit: 2014-03-22 | Discharge: 2014-03-22 | Disposition: A | Discharge status: Home / Self Care | Payer: Medicare Other | Source: Ambulatory Visit | Attending: Endocrinology | Admitting: Endocrinology

## 2014-03-22 DIAGNOSIS — E049 Nontoxic goiter, unspecified: Secondary | ICD-10-CM

## 2014-04-03 ENCOUNTER — Other Ambulatory Visit: Payer: Self-pay | Admitting: Endocrinology

## 2014-04-03 DIAGNOSIS — E049 Nontoxic goiter, unspecified: Secondary | ICD-10-CM

## 2014-04-09 ENCOUNTER — Other Ambulatory Visit: Payer: Self-pay | Admitting: Neurosurgery

## 2014-04-09 DIAGNOSIS — M47812 Spondylosis without myelopathy or radiculopathy, cervical region: Secondary | ICD-10-CM

## 2014-04-12 ENCOUNTER — Ambulatory Visit
Admission: RE | Admit: 2014-04-12 | Discharge: 2014-04-12 | Disposition: A | Discharge status: Home / Self Care | Payer: Medicare Other | Source: Ambulatory Visit | Attending: Neurosurgery | Admitting: Neurosurgery

## 2014-04-12 DIAGNOSIS — M47812 Spondylosis without myelopathy or radiculopathy, cervical region: Secondary | ICD-10-CM

## 2014-04-15 ENCOUNTER — Other Ambulatory Visit (HOSPITAL_COMMUNITY): Payer: Self-pay | Admitting: Gastroenterology

## 2014-04-15 DIAGNOSIS — B182 Chronic viral hepatitis C: Secondary | ICD-10-CM

## 2014-04-18 ENCOUNTER — Ambulatory Visit (HOSPITAL_COMMUNITY): Payer: Medicare Other

## 2014-05-01 ENCOUNTER — Ambulatory Visit (HOSPITAL_COMMUNITY)
Admission: RE | Admit: 2014-05-01 | Discharge: 2014-05-01 | Disposition: A | Discharge status: Home / Self Care | Payer: Medicare Other | Source: Ambulatory Visit | Attending: Gastroenterology | Admitting: Gastroenterology

## 2014-05-01 DIAGNOSIS — B182 Chronic viral hepatitis C: Secondary | ICD-10-CM

## 2014-05-01 DIAGNOSIS — B1920 Unspecified viral hepatitis C without hepatic coma: Secondary | ICD-10-CM | POA: Insufficient documentation

## 2014-05-01 DIAGNOSIS — Q619 Cystic kidney disease, unspecified: Secondary | ICD-10-CM | POA: Insufficient documentation

## 2014-05-20 ENCOUNTER — Other Ambulatory Visit: Payer: Self-pay

## 2014-05-20 DIAGNOSIS — Z1231 Encounter for screening mammogram for malignant neoplasm of breast: Secondary | ICD-10-CM

## 2014-06-18 ENCOUNTER — Ambulatory Visit (INDEPENDENT_AMBULATORY_CARE_PROVIDER_SITE_OTHER): Payer: Medicare Other | Admitting: Women's Health

## 2014-06-18 ENCOUNTER — Encounter: Payer: Self-pay | Admitting: Women's Health

## 2014-06-18 DIAGNOSIS — R300 Dysuria: Secondary | ICD-10-CM

## 2014-06-18 DIAGNOSIS — N949 Unspecified condition associated with female genital organs and menstrual cycle: Secondary | ICD-10-CM

## 2014-06-18 DIAGNOSIS — N9489 Other specified conditions associated with female genital organs and menstrual cycle: Secondary | ICD-10-CM

## 2014-06-18 LAB — URINALYSIS W MICROSCOPIC + REFLEX CULTURE
Bilirubin Urine: NEGATIVE
Crystals: NONE SEEN
GLUCOSE, UA: NEGATIVE mg/dL
HGB URINE DIPSTICK: NEGATIVE
Ketones, ur: NEGATIVE mg/dL
Nitrite: NEGATIVE
PH: 7 (ref 5.0–8.0)
Protein, ur: NEGATIVE mg/dL
RBC / HPF: NONE SEEN RBC/hpf (ref <3)
Specific Gravity, Urine: 1.01 (ref 1.005–1.030)
Urobilinogen, UA: 0.2 mg/dL (ref 0.0–1.0)

## 2014-06-18 LAB — WET PREP FOR TRICH, YEAST, CLUE
Clue Cells Wet Prep HPF POC: NONE SEEN
Trich, Wet Prep: NONE SEEN
YEAST WET PREP: NONE SEEN

## 2014-06-18 MED ORDER — NYSTATIN 100000 UNIT/GM EX CREA: 1.0000 "application " | TOPICAL_CREAM | Freq: Two times a day (BID) | CUTANEOUS | Status: DC

## 2014-06-18 NOTE — Patient Instructions (Signed)

## 2014-06-18 NOTE — Progress Notes (Signed)
Patient ID: [name], female   DOB: December 25, 1955, 58 y.o.   MRN: [id number] Presents with complaint of vaginal burning with itching and irritation. Burning with urination, denies pain at end of stream of urination. Mild frequency. Denies abdominal pain or fever. Postmenopausal on no HRT. Currently in treatment for hepatitis C - 56 day treatment per Dr. Amedeo Plenty.  Exam: Appears well.   External genitalia atrophic, extremely erythematous at introitus, wet prep done with a Q-tip. Wet prep negative. UA: Small leukocytes, 7-10 WBCs, few bacteria.  Atrophic vaginitis with external itching and irritation  Plan: Nystatin cream twice daily, loose clothes, yeast prevention discussed. Urine culture pending. Instructed to call if no relief of symptoms.

## 2014-06-20 LAB — URINE CULTURE: Colony Count: >=100000

## 2014-06-25 ENCOUNTER — Encounter: Payer: Self-pay | Admitting: Women's Health

## 2014-06-25 ENCOUNTER — Ambulatory Visit
Admission: RE | Admit: 2014-06-25 | Discharge: 2014-06-25 | Disposition: A | Discharge status: Home / Self Care | Payer: Medicare Other | Source: Ambulatory Visit

## 2014-06-25 DIAGNOSIS — Z1231 Encounter for screening mammogram for malignant neoplasm of breast: Secondary | ICD-10-CM

## 2014-07-04 ENCOUNTER — Other Ambulatory Visit: Payer: Self-pay | Admitting: Orthopedic Surgery

## 2014-07-04 DIAGNOSIS — M7580 Other shoulder lesions, unspecified shoulder: Principal | ICD-10-CM

## 2014-07-04 DIAGNOSIS — M25819 Other specified joint disorders, unspecified shoulder: Secondary | ICD-10-CM

## 2014-07-10 ENCOUNTER — Encounter: Payer: Self-pay | Admitting: Women's Health

## 2014-07-10 ENCOUNTER — Ambulatory Visit (INDEPENDENT_AMBULATORY_CARE_PROVIDER_SITE_OTHER): Payer: Medicare Other | Admitting: Women's Health

## 2014-07-10 VITALS — BP 136/78 | Ht 67.0 in | Wt 186.0 lb

## 2014-07-10 DIAGNOSIS — M949 Disorder of cartilage, unspecified: Secondary | ICD-10-CM

## 2014-07-10 DIAGNOSIS — N898 Other specified noninflammatory disorders of vagina: Secondary | ICD-10-CM

## 2014-07-10 DIAGNOSIS — M899 Disorder of bone, unspecified: Secondary | ICD-10-CM

## 2014-07-10 DIAGNOSIS — Z01419 Encounter for gynecological examination (general) (routine) without abnormal findings: Secondary | ICD-10-CM

## 2014-07-10 DIAGNOSIS — M8580 Other specified disorders of bone density and structure, unspecified site: Secondary | ICD-10-CM

## 2014-07-10 LAB — WET PREP FOR TRICH, YEAST, CLUE
CLUE CELLS WET PREP: NONE SEEN
TRICH WET PREP: NONE SEEN
Yeast Wet Prep HPF POC: NONE SEEN

## 2014-07-10 NOTE — Patient Instructions (Signed)
Health Recommendations for Postmenopausal Women Respected and ongoing research has looked at the most common causes of death, disability, and poor quality of life in postmenopausal women. The causes include heart disease, diseases of blood vessels, diabetes, depression, cancer, and bone loss (osteoporosis). Many things can be done to help lower the chances of developing these and other common problems. CARDIOVASCULAR DISEASE Heart Disease: A heart attack is a medical emergency. Know the signs and symptoms of a heart attack. Below are things women can do to reduce their risk for heart disease.   Do not smoke. If you smoke, quit.  Aim for a healthy weight. Being overweight causes many preventable deaths. Eat a healthy and balanced diet and drink an adequate amount of liquids.  Get moving. Make a commitment to be more physically active. Aim for 30 minutes of activity on most, if not all days of the week.  Eat for heart health. Choose a diet that is low in saturated fat and cholesterol and eliminate trans fat. Include whole grains, vegetables, and fruits. Read and understand the labels on food containers before buying.  Know your numbers. Ask your caregiver to check your blood pressure, cholesterol (total, HDL, LDL, triglycerides) and blood glucose. Work with your caregiver on improving your entire clinical picture.  High blood pressure. Limit or stop your table salt intake (try salt substitute and food seasonings). Avoid salty foods and drinks. Read labels on food containers before buying. Eating well and exercising can help control high blood pressure. STROKE  Stroke is a medical emergency. Stroke may be the result of a blood clot in a blood vessel in the brain or by a brain hemorrhage (bleeding). Know the signs and symptoms of a stroke. To lower the risk of developing a stroke:  Avoid fatty foods.  Quit smoking.  Control your diabetes, blood pressure, and irregular heart rate. THROMBOPHLEBITIS  (BLOOD CLOT) OF THE LEG  Becoming overweight and leading a stationary lifestyle may also contribute to developing blood clots. Controlling your diet and exercising will help lower the risk of developing blood clots. CANCER SCREENING  Breast Cancer: Take steps to reduce your risk of breast cancer.  You should practice "breast self-awareness." This means understanding the normal appearance and feel of your breasts and should include breast self-examination. Any changes detected, no matter how small, should be reported to your caregiver.  After age 40, you should have a clinical breast exam (CBE) every year.  Starting at age 40, you should consider having a mammogram (breast X-ray) every year.  If you have a family history of breast cancer, talk to your caregiver about genetic screening.  If you are at high risk for breast cancer, talk to your caregiver about having an MRI and a mammogram every year.  Intestinal or Stomach Cancer: Tests to consider are a rectal exam, fecal occult blood, sigmoidoscopy, and colonoscopy. Women who are high risk may need to be screened at an earlier age and more often.  Cervical Cancer:  Beginning at age 30, you should have a Pap test every 3 years as long as the past 3 Pap tests have been normal.  If you have had past treatment for cervical cancer or a condition that could lead to cancer, you need Pap tests and screening for cancer for at least 20 years after your treatment.  If you had a hysterectomy for a problem that was not cancer or a condition that could lead to cancer, then you no longer need Pap tests.    If you are between ages 65 and 70, and you have had normal Pap tests going back 10 years, you no longer need Pap tests.  If Pap tests have been discontinued, risk factors (such as a new sexual partner) need to be reassessed to determine if screening should be resumed.  Some medical problems can increase the chance of getting cervical cancer. In these  cases, your caregiver may recommend more frequent screening and Pap tests.  Uterine Cancer: If you have vaginal bleeding after reaching menopause, you should notify your caregiver.  Ovarian Cancer: Other than yearly pelvic exams, there are no reliable tests available to screen for ovarian cancer at this time except for yearly pelvic exams.  Lung Cancer: Yearly chest X-rays can detect lung cancer and should be done on high risk women, such as cigarette smokers and women with chronic lung disease (emphysema).  Skin Cancer: A complete body skin exam should be done at your yearly examination. Avoid overexposure to the sun and ultraviolet light lamps. Use a strong sun block cream when in the sun. All of these things are important for lowering the risk of skin cancer. MENOPAUSE Menopause Symptoms: Hormone therapy products are effective for treating symptoms associated with menopause:  Moderate to severe hot flashes.  Night sweats.  Mood swings.  Headaches.  Tiredness.  Loss of sex drive.  Insomnia.  Other symptoms. Hormone replacement carries certain risks, especially in older women. Women who use or are thinking about using estrogen or estrogen with progestin treatments should discuss that with their caregiver. Your caregiver will help you understand the benefits and risks. The ideal dose of hormone replacement therapy is not known. The Food and Drug Administration (FDA) has concluded that hormone therapy should be used only at the lowest doses and for the shortest amount of time to reach treatment goals.  OSTEOPOROSIS Protecting Against Bone Loss and Preventing Fracture If you use hormone therapy for prevention of bone loss (osteoporosis), the risks for bone loss must outweigh the risk of the therapy. Ask your caregiver about other medications known to be safe and effective for preventing bone loss and fractures. To guard against bone loss or fractures, the following is recommended:  If  you are younger than age 50, take 1000 mg of calcium and at least 600 mg of Vitamin D per day.  If you are older than age 50 but younger than age 70, take 1200 mg of calcium and at least 600 mg of Vitamin D per day.  If you are older than age 70, take 1200 mg of calcium and at least 800 mg of Vitamin D per day. Smoking and excessive alcohol intake increases the risk of osteoporosis. Eat foods rich in calcium and vitamin D and do weight bearing exercises several times a week as your caregiver suggests. DIABETES Diabetes Mellitus: If you have type I or type 2 diabetes, you should keep your blood sugar under control with diet, exercise, and recommended medication. Avoid starchy and fatty foods, and too many sweets. Being overweight can make diabetes control more difficult. COGNITION AND MEMORY Cognition and Memory: Menopausal hormone therapy is not recommended for the prevention of cognitive disorders such as Alzheimer's disease or memory loss.  DEPRESSION  Depression may occur at any age, but it is common in elderly women. This may be because of physical, medical, social (loneliness), or financial problems and needs. If you are experiencing depression because of medical problems and control of symptoms, talk to your caregiver about this. Physical   activity and exercise may help with mood and sleep. Community and volunteer involvement may improve your sense of value and worth. If you have depression and you feel that the problem is getting worse or becoming severe, talk to your caregiver about which treatment options are best for you. ACCIDENTS  Accidents are common and can be serious in elderly woman. Prepare your house to prevent accidents. Eliminate throw rugs, place hand bars in bath, shower, and toilet areas. Avoid wearing high heeled shoes or walking on wet, snowy, and icy areas. Limit or stop driving if you have vision or hearing problems, or if you feel you are unsteady with your movements and  reflexes. HEPATITIS C Hepatitis C is a type of viral infection affecting the liver. It is spread mainly through contact with blood from an infected person. It can be treated, but if left untreated, it can lead to severe liver damage over the years. Many people who are infected do not know that the virus is in their blood. If you are a "baby-boomer", it is recommended that you have one screening test for Hepatitis C. IMMUNIZATIONS  Several immunizations are important to consider having during your senior years, including:   Tetanus, diphtheria, and pertussis booster shot.  Influenza every year before the flu season begins.  Pneumonia vaccine.  Shingles vaccine.  Others, as indicated based on your specific needs. Talk to your caregiver about these. Document Released: 11/26/2005 Document Revised: 02/18/2014 Document Reviewed: 07/22/2008 [employer] Patient Information 2015 [employer]. This information is not intended to replace advice given to you by your health care provider. Make sure you discuss any questions you have with your health care provider.  

## 2014-07-10 NOTE — Progress Notes (Signed)
[name] [birth year] [id number]    History:    Presents for annual exam.  Postmenopausal/no bleeding/no HRT. Postmenopausal spotting with negative sonohysterogram 05/2012. Currently in treatment for hepatitis C, a 56 day medication by mouth treatment. Has scheduled followup. 02/2013 T score -1.1 right femoral neck FRAX 58%/0.3% FRAX 5.3%/0.3% normal Pap and mammogram history. Hypertension managed by primary care. Biggest problem is back pain/numerous surgeries. 2010 benign colon polyp, negative colonoscopy 2013-11-01. Father died of colon cancer at age 58.  Past medical history, past surgical history, family history and social history were all reviewed and documented in the EPIC chart. Chronic back pain. 2 granddaughters  very involved with.  ROS:  A  12 point ROS was performed and pertinent positives and negatives are included.  Exam:  Filed Vitals:   07/10/14 1408  BP: 136/78    General appearance:  Normal Thyroid:  Symmetrical, normal in size, without palpable masses or nodularity. Respiratory  Auscultation:  Clear without wheezing or rhonchi Cardiovascular  Auscultation:  Regular rate, without rubs, murmurs or gallops  Edema/varicosities:  Not grossly evident Abdominal  Soft,nontender, without masses, guarding or rebound.  Liver/spleen:  No organomegaly noted  Hernia:  None appreciated  Skin  Inspection:  Grossly normal   Breasts: Examined lying and sitting.     Right: Without masses, retractions, discharge or axillary adenopathy.     Left: Without masses, retractions, discharge or axillary adenopathy. Gentitourinary   Inguinal/mons:  Normal without inguinal adenopathy  External genitalia:  Normal  BUS/Urethra/Skene's glands:  Normal  Vagina:  Normal  Cervix:  Normal  Uterus:  normal in size, shape and contour.  Midline and mobile  Adnexa/parametria:     Rt: Without masses or tenderness.   Lt: Without masses or tenderness.  Anus and perineum: Normal  Digital rectal exam: Normal  sphincter tone without palpated masses or tenderness  Assessment/Plan:  58 y.o. MWF G1P1 for annual exam.     Chronic back pain-managed by orthopedist Chronic hep C completed medication for cure followup is scheduled Osteopenia without elevated FRAX Hypertension/hypothyroid/anxiety and depression- primary care manages labs and meds Postmenopausal/no bleeding/no HRT  Plan: SBE's, continue annual mammogram, calcium rich diet,  vitamin D 2000 daily, instructed to have vitamin D level checked with next labs. Pap, pap normal 2013, new screening guidelines reviewed.    [place], 3:13 PM 07/10/2014

## 2014-07-11 ENCOUNTER — Other Ambulatory Visit (HOSPITAL_COMMUNITY)
Admission: RE | Admit: 2014-07-11 | Discharge: 2014-07-11 | Disposition: A | Discharge status: Home / Self Care | Payer: Medicare Other | Source: Ambulatory Visit | Attending: Gynecology | Admitting: Gynecology

## 2014-07-11 DIAGNOSIS — Z01419 Encounter for gynecological examination (general) (routine) without abnormal findings: Secondary | ICD-10-CM | POA: Diagnosis present

## 2014-07-11 NOTE — Addendum Note (Signed)
Addended by: Burnett Kanaris on: 07/11/2014 10:11 AM   Modules accepted: Orders

## 2014-07-12 LAB — CYTOLOGY - PAP

## 2014-07-17 ENCOUNTER — Ambulatory Visit (INDEPENDENT_AMBULATORY_CARE_PROVIDER_SITE_OTHER): Payer: Medicare Other | Admitting: Gynecology

## 2014-07-17 ENCOUNTER — Encounter: Payer: Self-pay | Admitting: Gynecology

## 2014-07-17 VITALS — BP 126/82

## 2014-07-17 DIAGNOSIS — N760 Acute vaginitis: Secondary | ICD-10-CM

## 2014-07-17 DIAGNOSIS — N762 Acute vulvitis: Secondary | ICD-10-CM

## 2014-07-17 LAB — URINALYSIS W MICROSCOPIC + REFLEX CULTURE
Bilirubin Urine: NEGATIVE
CASTS: NONE SEEN
Crystals: NONE SEEN
Glucose, UA: NEGATIVE mg/dL
KETONES UR: NEGATIVE mg/dL
Nitrite: NEGATIVE
PH: 5.5 (ref 5.0–8.0)
Protein, ur: NEGATIVE mg/dL
Specific Gravity, Urine: 1.01 (ref 1.005–1.030)
Urobilinogen, UA: 0.2 mg/dL (ref 0.0–1.0)

## 2014-07-17 LAB — WET PREP FOR TRICH, YEAST, CLUE
Clue Cells Wet Prep HPF POC: NONE SEEN
Trich, Wet Prep: NONE SEEN
YEAST WET PREP: NONE SEEN

## 2014-07-17 MED ORDER — PREDNISONE (PAK) 10 MG PO TABS: ORAL_TABLET | Freq: Every day | ORAL | Status: DC

## 2014-07-17 NOTE — Progress Notes (Signed)
   Patient presented today complaining of vulvar irritation and discomfort. Patient with past history of hepatitis C recent completed her immunotherapy. Is doing well otherwise. Because of her menopausal vaginal atrophy she had been started on vaginal estrogen she was applied twice a week. Patient recently tried the nystatin cream as well as clobetasol with no relief.  Exam: External genitalia dry irritated Vagina: Atrophic changes noted Cervix: No lesions or discharge Bimanual exam: Not done Rectal exam: Not done  Urine: The WBC 21-50 Bacteria few  Wet prep negative  Assessment/plan: #1 urinary tract infection suspicious we'll wait for urine culture #2 patient with history of hepatitis C completed her treatment recently. Plantar irritation whether viral induce no evidence of bacterial infection or yeast. We're going to start her on a short course Prednisone 10 mg (Sterapred Unipak) to take over a six-day course. Last 2 to use her vaginal estrogen inside the vagina as well as external E. each bedtime for one week then 3 times a week for the second week and then twice a week thereafter.

## 2014-07-18 ENCOUNTER — Ambulatory Visit
Admission: RE | Admit: 2014-07-18 | Discharge: 2014-07-18 | Disposition: A | Discharge status: Home / Self Care | Payer: Medicare Other | Source: Ambulatory Visit | Attending: Orthopedic Surgery | Admitting: Orthopedic Surgery

## 2014-07-18 DIAGNOSIS — M7580 Other shoulder lesions, unspecified shoulder: Principal | ICD-10-CM

## 2014-07-18 DIAGNOSIS — M25819 Other specified joint disorders, unspecified shoulder: Secondary | ICD-10-CM

## 2014-07-18 MED ORDER — IOHEXOL 180 MG/ML  SOLN
15.0000 mL | Freq: Once | INTRAMUSCULAR | Status: AC | PRN
  Administered 2014-07-18: 15 mL via INTRA_ARTICULAR

## 2014-07-19 ENCOUNTER — Other Ambulatory Visit: Payer: Self-pay | Admitting: Gynecology

## 2014-07-19 ENCOUNTER — Telehealth: Payer: Self-pay

## 2014-07-19 LAB — URINE CULTURE

## 2014-07-19 MED ORDER — NITROFURANTOIN MONOHYD MACRO 100 MG PO CAPS: 100.0000 mg | ORAL_CAPSULE | Freq: Two times a day (BID) | ORAL | Status: DC

## 2014-07-19 MED ORDER — FLUCONAZOLE 150 MG PO TABS: 150.0000 mg | ORAL_TABLET | Freq: Once | ORAL | Status: DC

## 2014-07-19 NOTE — Telephone Encounter (Signed)
Call in  prescription for Diflucan 150 mg one by mouth with one refill

## 2014-07-19 NOTE — Telephone Encounter (Signed)
Message copied by Ramond Craver on Fri Jul 19, 2014  3:45 PM ------      Message from: Terrance Mass      Created: Fri Jul 19, 2014  3:31 PM       Please inform patient she has a mild urinary tract infection please: Prescription for Macrobid 1 by mouth twice a day for 7 days ------

## 2014-07-19 NOTE — Telephone Encounter (Signed)
Rx sent 

## 2014-07-19 NOTE — Telephone Encounter (Signed)
Call Documentation     Ramond Craver at 07/19/2014 3:45 PM     Status: Signed        Patient said the antibiotic for UTI below will give her a yeast infection and asked if you would send a Diflucan or whatever you recommend.        Ramond Craver at 07/19/2014 3:45 PM     Status: Signed        Message copied by Ramond Craver on Fri Jul 19, 2014 3:45 PM  ------  Message from: Terrance Mass  Created: Fri Jul 19, 2014 3:31 PM  Please inform patient she has a mild urinary tract infection please: Prescription for Macrobid 1 by mouth twice a day for 7 days

## 2014-07-19 NOTE — Telephone Encounter (Signed)
Patient said the antibiotic for UTI below will give her a yeast infection and asked if you would send a Diflucan or whatever you recommend.

## 2014-08-13 ENCOUNTER — Encounter: Payer: Self-pay | Admitting: Gynecology

## 2014-08-13 ENCOUNTER — Ambulatory Visit (INDEPENDENT_AMBULATORY_CARE_PROVIDER_SITE_OTHER): Payer: Medicare Other | Admitting: Gynecology

## 2014-08-13 VITALS — BP 124/80

## 2014-08-13 DIAGNOSIS — B3731 Acute candidiasis of vulva and vagina: Secondary | ICD-10-CM

## 2014-08-13 DIAGNOSIS — L298 Other pruritus: Secondary | ICD-10-CM

## 2014-08-13 DIAGNOSIS — N94819 Vulvodynia, unspecified: Secondary | ICD-10-CM | POA: Insufficient documentation

## 2014-08-13 DIAGNOSIS — B373 Candidiasis of vulva and vagina: Secondary | ICD-10-CM

## 2014-08-13 DIAGNOSIS — N898 Other specified noninflammatory disorders of vagina: Secondary | ICD-10-CM

## 2014-08-13 DIAGNOSIS — R300 Dysuria: Secondary | ICD-10-CM

## 2014-08-13 LAB — URINALYSIS W MICROSCOPIC + REFLEX CULTURE
BILIRUBIN URINE: NEGATIVE
Glucose, UA: NEGATIVE mg/dL
Hgb urine dipstick: NEGATIVE
KETONES UR: NEGATIVE mg/dL
Leukocytes, UA: NEGATIVE
Nitrite: NEGATIVE
PH: 5 (ref 5.0–8.0)
Protein, ur: NEGATIVE mg/dL
Urobilinogen, UA: 0.2 mg/dL (ref 0.0–1.0)

## 2014-08-13 LAB — WET PREP FOR TRICH, YEAST, CLUE
Clue Cells Wet Prep HPF POC: NONE SEEN
Trich, Wet Prep: NONE SEEN

## 2014-08-13 MED ORDER — FLUCONAZOLE 150 MG PO TABS: 150.0000 mg | ORAL_TABLET | Freq: Once | ORAL | Status: DC

## 2014-08-13 MED ORDER — LIDOCAINE HCL 2 % EX GEL: 1.0000 "application " | CUTANEOUS | Status: DC | PRN

## 2014-08-13 MED ORDER — GABAPENTIN 300 MG PO CAPS: ORAL_CAPSULE | ORAL | Status: DC

## 2014-08-13 NOTE — Patient Instructions (Signed)
Gabapentin capsules or tablets What is this medicine? GABAPENTIN (GA ba pen tin) is used to control partial seizures in adults with epilepsy. It is also used to treat certain types of nerve pain. This medicine may be used for other purposes; ask your health care provider or pharmacist if you have questions. COMMON BRAND NAME(S): Gabarone, Neurontin What should I tell my health care provider before I take this medicine? They need to know if you have any of these conditions: -kidney disease -suicidal thoughts, plans, or attempt; a previous suicide attempt by you or a family member -an unusual or allergic reaction to gabapentin, other medicines, foods, dyes, or preservatives -pregnant or trying to get pregnant -breast-feeding How should I use this medicine? Take this medicine by mouth with a glass of water. Follow the directions on the prescription label. You can take it with or without food. If it upsets your stomach, take it with food.Take your medicine at regular intervals. Do not take it more often than directed. Do not stop taking except on your doctor's advice. If you are directed to break the 600 or 800 mg tablets in half as part of your dose, the extra half tablet should be used for the next dose. If you have not used the extra half tablet within 28 days, it should be thrown away. A special MedGuide will be given to you by the pharmacist with each prescription and refill. Be sure to read this information carefully each time. Talk to your pediatrician regarding the use of this medicine in children. Special care may be needed. Overdosage: If you think you have taken too much of this medicine contact a poison control center or emergency room at once. NOTE: This medicine is only for you. Do not share this medicine with others. What if I miss a dose? If you miss a dose, take it as soon as you can. If it is almost time for your next dose, take only that dose. Do not take double or extra  doses. What may interact with this medicine? Do not take this medicine with any of the following medications: -other gabapentin products This medicine may also interact with the following medications: -alcohol -antacids -antihistamines for allergy, cough and cold -certain medicines for anxiety or sleep -certain medicines for depression or psychotic disturbances -homatropine; hydrocodone -naproxen -narcotic medicines (opiates) for pain -phenothiazines like chlorpromazine, mesoridazine, prochlorperazine, thioridazine This list may not describe all possible interactions. Give your health care provider a list of all the medicines, herbs, non-prescription drugs, or dietary supplements you use. Also tell them if you smoke, drink alcohol, or use illegal drugs. Some items may interact with your medicine. What should I watch for while using this medicine? Visit your doctor or health care professional for regular checks on your progress. You may want to keep a record at home of how you feel your condition is responding to treatment. You may want to share this information with your doctor or health care professional at each visit. You should contact your doctor or health care professional if your seizures get worse or if you have any new types of seizures. Do not stop taking this medicine or any of your seizure medicines unless instructed by your doctor or health care professional. Stopping your medicine suddenly can increase your seizures or their severity. Wear a medical identification bracelet or chain if you are taking this medicine for seizures, and carry a card that lists all your medications. You may get drowsy, dizzy, or have blurred   vision. Do not drive, use machinery, or do anything that needs mental alertness until you know how this medicine affects you. To reduce dizzy or fainting spells, do not sit or stand up quickly, especially if you are an older patient. Alcohol can increase drowsiness and  dizziness. Avoid alcoholic drinks. Your mouth may get dry. Chewing sugarless gum or sucking hard candy, and drinking plenty of water will help. The use of this medicine may increase the chance of suicidal thoughts or actions. Pay special attention to how you are responding while on this medicine. Any worsening of mood, or thoughts of suicide or dying should be reported to your health care professional right away. Women who become pregnant while using this medicine may enroll in the [place] Antiepileptic Drug Pregnancy Registry by calling 1-888-233-2334. This registry collects information about the safety of antiepileptic drug use during pregnancy. What side effects may I notice from receiving this medicine? Side effects that you should report to your doctor or health care professional as soon as possible: -allergic reactions like skin rash, itching or hives, swelling of the face, lips, or tongue -worsening of mood, thoughts or actions of suicide or dying Side effects that usually do not require medical attention (report to your doctor or health care professional if they continue or are bothersome): -constipation -difficulty walking or controlling muscle movements -dizziness -nausea -slurred speech -tiredness -tremors -weight gain This list may not describe all possible side effects. Call your doctor for medical advice about side effects. You may report side effects to FDA at 1-800-FDA-1088. Where should I keep my medicine? Keep out of reach of children. Store at room temperature between 15 and 30 degrees C (59 and 86 degrees F). Throw away any unused medicine after the expiration date. NOTE: This sheet is a summary. It may not cover all possible information. If you have questions about this medicine, talk to your doctor, pharmacist, or health care provider.  2015, [employer]vier/Gold Standard. (2013-06-07 09:12:48)  

## 2014-08-13 NOTE — Progress Notes (Signed)
   Patient presents to the office today as a result of her continued vaginal burning sensation and bulbar irritation. She's had some urinary frequency no true vaginal discharge.Patient with past history of hepatitis C recent completed her immunotherapy. Is doing well otherwise. Because of her menopausal vaginal atrophy she had been started on vaginal estrogen she was applied twice a week. Patient recently tried the nystatin cream as well as clobetasol with no relief. When she was last seen in the office on September 30 she was started on a short course of prednisone 10 mg (Sterapred Unipak) over a 6 day course and she was to continue to use vaginal estrogen twice a week because of vaginal atrophy. This gave her minimal relief.  Exam: Bartholin urethra and Skene glands with no gross lesion irritated on contact Vagina some atrophic changes are noted very hyperemic vaginal mucosa but no discharge cervix no lesions or discharge Bimanual exam not done Rectal exam not done  Urinalysis was negative  Wet prep moderate yeast was noted few white blood cells and many bacteria  Assessment/plan: Patient with vulvodynia at first it was thought of her being immunocompromised because of her treatment and past history of hepatitis C but she has been in remission and had been doing well with the exception of the vulvodynia. Several regimens for treatment as discussed above were tried. On review it appears that the Prozac that she has been taking after long-term usage has a potential for contributing to severe rash and vasculitis. We are going to have her taper off of the Prozac and she is going to be started on Neurontin 300 mg 1 by mouth daily for the first week and then 1 tablet twice a day thereafter. This will help her with her neuropathic unprovoked vulvodynia. She will also be prescribed 2% Xylocaine gel to apply when necessary. She will also continue with the estrogen vaginal cream twice a week. And for yeast  infection she'll be prescribed Diflucan 150 mg 1 by mouth. Time of consultation 30 minutes.

## 2014-08-15 ENCOUNTER — Telehealth: Payer: Self-pay | Admitting: *Deleted

## 2014-08-15 MED ORDER — GABAPENTIN 300 MG PO CAPS: ORAL_CAPSULE | ORAL | Status: DC

## 2014-08-15 NOTE — Telephone Encounter (Signed)
Pt called and said Rx for Gabapentin was not a pharmacy, Rx will be resent.

## 2014-08-19 ENCOUNTER — Encounter: Payer: Self-pay | Admitting: Gynecology

## 2014-10-24 ENCOUNTER — Other Ambulatory Visit: Payer: Self-pay | Admitting: Orthopedic Surgery

## 2014-11-18 HISTORY — PX: SHOULDER SURGERY: SHX246

## 2014-12-02 ENCOUNTER — Encounter (HOSPITAL_BASED_OUTPATIENT_CLINIC_OR_DEPARTMENT_OTHER): Payer: Self-pay | Admitting: *Deleted

## 2014-12-02 NOTE — Progress Notes (Signed)
Will come in for [id number]-ekg Will bring all meds and overnight bag just in case she has to stay

## 2014-12-03 ENCOUNTER — Other Ambulatory Visit: Payer: Self-pay

## 2014-12-03 ENCOUNTER — Encounter (HOSPITAL_BASED_OUTPATIENT_CLINIC_OR_DEPARTMENT_OTHER)
Admission: RE | Admit: 2014-12-03 | Discharge: 2014-12-03 | Disposition: A | Discharge status: Home / Self Care | Payer: Medicare Other | Source: Ambulatory Visit | Attending: Orthopedic Surgery | Admitting: Orthopedic Surgery

## 2014-12-03 DIAGNOSIS — M7541 Impingement syndrome of right shoulder: Secondary | ICD-10-CM | POA: Diagnosis not present

## 2014-12-03 DIAGNOSIS — Z6829 Body mass index (BMI) 29.0-29.9, adult: Secondary | ICD-10-CM | POA: Diagnosis not present

## 2014-12-03 DIAGNOSIS — Z7951 Long term (current) use of inhaled steroids: Secondary | ICD-10-CM | POA: Diagnosis not present

## 2014-12-03 DIAGNOSIS — M8580 Other specified disorders of bone density and structure, unspecified site: Secondary | ICD-10-CM | POA: Diagnosis not present

## 2014-12-03 DIAGNOSIS — E039 Hypothyroidism, unspecified: Secondary | ICD-10-CM | POA: Diagnosis not present

## 2014-12-03 DIAGNOSIS — K219 Gastro-esophageal reflux disease without esophagitis: Secondary | ICD-10-CM | POA: Diagnosis not present

## 2014-12-03 DIAGNOSIS — Z01818 Encounter for other preprocedural examination: Secondary | ICD-10-CM | POA: Diagnosis not present

## 2014-12-03 DIAGNOSIS — E669 Obesity, unspecified: Secondary | ICD-10-CM | POA: Diagnosis not present

## 2014-12-03 DIAGNOSIS — Z87891 Personal history of nicotine dependence: Secondary | ICD-10-CM | POA: Diagnosis not present

## 2014-12-03 DIAGNOSIS — B1920 Unspecified viral hepatitis C without hepatic coma: Secondary | ICD-10-CM | POA: Diagnosis not present

## 2014-12-03 DIAGNOSIS — F329 Major depressive disorder, single episode, unspecified: Secondary | ICD-10-CM | POA: Diagnosis not present

## 2014-12-03 DIAGNOSIS — F419 Anxiety disorder, unspecified: Secondary | ICD-10-CM | POA: Diagnosis not present

## 2014-12-03 DIAGNOSIS — E119 Type 2 diabetes mellitus without complications: Secondary | ICD-10-CM | POA: Diagnosis not present

## 2014-12-03 DIAGNOSIS — M719 Bursopathy, unspecified: Secondary | ICD-10-CM | POA: Diagnosis not present

## 2014-12-03 DIAGNOSIS — I10 Essential (primary) hypertension: Secondary | ICD-10-CM | POA: Diagnosis not present

## 2014-12-03 DIAGNOSIS — F1099 Alcohol use, unspecified with unspecified alcohol-induced disorder: Secondary | ICD-10-CM | POA: Diagnosis not present

## 2014-12-03 LAB — BASIC METABOLIC PANEL
ANION GAP: 7 (ref 5–15)
BUN: 15 mg/dL (ref 6–23)
CALCIUM: 9.2 mg/dL (ref 8.4–10.5)
CO2: 26 mmol/L (ref 19–32)
Chloride: 105 mmol/L (ref 96–112)
Creatinine, Ser: 1.06 mg/dL (ref 0.50–1.10)
GFR calc non Af Amer: 57 mL/min — ABNORMAL LOW (ref >90)
GFR, EST AFRICAN AMERICAN: 66 mL/min — AB (ref >90)
Glucose, Bld: 103 mg/dL — ABNORMAL HIGH (ref 70–99)
Potassium: 4.3 mmol/L (ref 3.5–5.1)
SODIUM: 138 mmol/L (ref 135–145)

## 2014-12-06 ENCOUNTER — Ambulatory Visit (HOSPITAL_BASED_OUTPATIENT_CLINIC_OR_DEPARTMENT_OTHER)
Admission: RE | Admit: 2014-12-06 | Discharge: 2014-12-06 | Disposition: A | Discharge status: Home / Self Care | Payer: Medicare Other | Source: Ambulatory Visit | Attending: Orthopedic Surgery | Admitting: Orthopedic Surgery

## 2014-12-06 ENCOUNTER — Encounter (HOSPITAL_BASED_OUTPATIENT_CLINIC_OR_DEPARTMENT_OTHER)
Admission: RE | Disposition: A | Discharge status: Home / Self Care | Payer: Self-pay | Source: Ambulatory Visit | Attending: Orthopedic Surgery

## 2014-12-06 ENCOUNTER — Ambulatory Visit (HOSPITAL_BASED_OUTPATIENT_CLINIC_OR_DEPARTMENT_OTHER): Payer: Medicare Other | Admitting: Anesthesiology

## 2014-12-06 ENCOUNTER — Encounter (HOSPITAL_BASED_OUTPATIENT_CLINIC_OR_DEPARTMENT_OTHER): Payer: Self-pay | Admitting: *Deleted

## 2014-12-06 DIAGNOSIS — K219 Gastro-esophageal reflux disease without esophagitis: Secondary | ICD-10-CM | POA: Insufficient documentation

## 2014-12-06 DIAGNOSIS — M7541 Impingement syndrome of right shoulder: Secondary | ICD-10-CM | POA: Diagnosis not present

## 2014-12-06 DIAGNOSIS — Z6829 Body mass index (BMI) 29.0-29.9, adult: Secondary | ICD-10-CM | POA: Insufficient documentation

## 2014-12-06 DIAGNOSIS — Z7951 Long term (current) use of inhaled steroids: Secondary | ICD-10-CM | POA: Insufficient documentation

## 2014-12-06 DIAGNOSIS — B1920 Unspecified viral hepatitis C without hepatic coma: Secondary | ICD-10-CM | POA: Diagnosis not present

## 2014-12-06 DIAGNOSIS — E039 Hypothyroidism, unspecified: Secondary | ICD-10-CM | POA: Insufficient documentation

## 2014-12-06 DIAGNOSIS — F419 Anxiety disorder, unspecified: Secondary | ICD-10-CM | POA: Insufficient documentation

## 2014-12-06 DIAGNOSIS — E119 Type 2 diabetes mellitus without complications: Secondary | ICD-10-CM | POA: Insufficient documentation

## 2014-12-06 DIAGNOSIS — Z87891 Personal history of nicotine dependence: Secondary | ICD-10-CM | POA: Insufficient documentation

## 2014-12-06 DIAGNOSIS — M8580 Other specified disorders of bone density and structure, unspecified site: Secondary | ICD-10-CM | POA: Insufficient documentation

## 2014-12-06 DIAGNOSIS — I10 Essential (primary) hypertension: Secondary | ICD-10-CM | POA: Insufficient documentation

## 2014-12-06 DIAGNOSIS — E669 Obesity, unspecified: Secondary | ICD-10-CM | POA: Insufficient documentation

## 2014-12-06 DIAGNOSIS — F1099 Alcohol use, unspecified with unspecified alcohol-induced disorder: Secondary | ICD-10-CM | POA: Insufficient documentation

## 2014-12-06 DIAGNOSIS — M719 Bursopathy, unspecified: Secondary | ICD-10-CM | POA: Diagnosis not present

## 2014-12-06 DIAGNOSIS — F329 Major depressive disorder, single episode, unspecified: Secondary | ICD-10-CM | POA: Insufficient documentation

## 2014-12-06 HISTORY — DX: Gastro-esophageal reflux disease without esophagitis: K21.9

## 2014-12-06 HISTORY — DX: Unspecified osteoarthritis, unspecified site: M19.90

## 2014-12-06 HISTORY — PX: SHOULDER ARTHROSCOPY WITH SUBACROMIAL DECOMPRESSION: SHX5684

## 2014-12-06 HISTORY — DX: Other intervertebral disc degeneration, lumbar region without mention of lumbar back pain or lower extremity pain: M51.369

## 2014-12-06 HISTORY — PX: SHOULDER SURGERY: SHX246

## 2014-12-06 HISTORY — DX: Other intervertebral disc degeneration, lumbar region: M51.36

## 2014-12-06 LAB — POCT HEMOGLOBIN-HEMACUE: Hemoglobin: 14.1 g/dL (ref 12.0–15.0)

## 2014-12-06 SURGERY — SHOULDER ARTHROSCOPY WITH SUBACROMIAL DECOMPRESSION
Anesthesia: General | Site: Shoulder | Laterality: Right

## 2014-12-06 MED ORDER — CEFAZOLIN SODIUM-DEXTROSE 2-3 GM-% IV SOLR
INTRAVENOUS | Status: AC
  Filled 2014-12-06: qty 50

## 2014-12-06 MED ORDER — EPINEPHRINE HCL 1 MG/ML IJ SOLN
INTRAMUSCULAR | Status: AC
  Filled 2014-12-06: qty 1

## 2014-12-06 MED ORDER — MIDAZOLAM HCL 2 MG/2ML IJ SOLN
1.0000 mg | INTRAMUSCULAR | Status: DC | PRN
  Administered 2014-12-06: 2 mg via INTRAVENOUS

## 2014-12-06 MED ORDER — BUPIVACAINE HCL (PF) 0.25 % IJ SOLN
INTRAMUSCULAR | Status: AC
  Filled 2014-12-06: qty 30

## 2014-12-06 MED ORDER — HYDROMORPHONE HCL 2 MG PO TABS
2.0000 mg | ORAL_TABLET | Freq: Once | ORAL | Status: AC
  Administered 2014-12-06: 2 mg via ORAL

## 2014-12-06 MED ORDER — PROPOFOL 10 MG/ML IV EMUL
INTRAVENOUS | Status: AC
  Filled 2014-12-06: qty 50

## 2014-12-06 MED ORDER — MIDAZOLAM HCL 2 MG/2ML IJ SOLN
INTRAMUSCULAR | Status: AC
  Filled 2014-12-06: qty 2

## 2014-12-06 MED ORDER — CEFAZOLIN SODIUM-DEXTROSE 2-3 GM-% IV SOLR
2.0000 g | INTRAVENOUS | Status: AC
  Administered 2014-12-06: 2 g via INTRAVENOUS

## 2014-12-06 MED ORDER — FENTANYL CITRATE 0.05 MG/ML IJ SOLN
INTRAMUSCULAR | Status: AC
  Filled 2014-12-06: qty 2

## 2014-12-06 MED ORDER — FENTANYL CITRATE 0.05 MG/ML IJ SOLN
50.0000 ug | INTRAMUSCULAR | Status: DC | PRN
  Administered 2014-12-06: 100 ug via INTRAVENOUS

## 2014-12-06 MED ORDER — SODIUM CHLORIDE 0.45 % IV SOLN
INTRAVENOUS | Status: DC
Start: 2014-12-06 — End: 2014-12-06

## 2014-12-06 MED ORDER — ROPIVACAINE HCL 5 MG/ML IJ SOLN
INTRAMUSCULAR | Status: DC | PRN
  Administered 2014-12-06: 25 mL via PERINEURAL

## 2014-12-06 MED ORDER — LACTATED RINGERS IV SOLN
INTRAVENOUS | Status: DC
  Administered 2014-12-06: 07:00:00 via INTRAVENOUS

## 2014-12-06 MED ORDER — CHLORHEXIDINE GLUCONATE 4 % EX LIQD: 60.0000 mL | Freq: Once | CUTANEOUS | Status: DC

## 2014-12-06 MED ORDER — FENTANYL CITRATE 0.05 MG/ML IJ SOLN
INTRAMUSCULAR | Status: DC | PRN
  Administered 2014-12-06: 100 ug via INTRAVENOUS
  Administered 2014-12-06: 50 ug via INTRAVENOUS

## 2014-12-06 MED ORDER — PROPOFOL 10 MG/ML IV BOLUS
INTRAVENOUS | Status: DC | PRN
  Administered 2014-12-06: 150 mg via INTRAVENOUS

## 2014-12-06 MED ORDER — DEXAMETHASONE SODIUM PHOSPHATE 4 MG/ML IJ SOLN
INTRAMUSCULAR | Status: DC | PRN
  Administered 2014-12-06: 10 mg via INTRAVENOUS

## 2014-12-06 MED ORDER — BUPIVACAINE-EPINEPHRINE (PF) 0.25% -1:200000 IJ SOLN
INTRAMUSCULAR | Status: AC
  Filled 2014-12-06: qty 30

## 2014-12-06 MED ORDER — PROMETHAZINE HCL 25 MG/ML IJ SOLN: 6.2500 mg | INTRAMUSCULAR | Status: DC | PRN

## 2014-12-06 MED ORDER — SODIUM CHLORIDE 0.9 % IR SOLN
Status: DC | PRN
  Administered 2014-12-06: 6000 mL

## 2014-12-06 MED ORDER — MIDAZOLAM HCL 5 MG/5ML IJ SOLN
INTRAMUSCULAR | Status: DC | PRN
  Administered 2014-12-06: 2 mg via INTRAVENOUS

## 2014-12-06 MED ORDER — LIDOCAINE HCL (CARDIAC) 10 MG/ML IV SOLN
INTRAVENOUS | Status: DC | PRN
  Administered 2014-12-06: 60 mg via INTRAVENOUS

## 2014-12-06 MED ORDER — KETOROLAC TROMETHAMINE 30 MG/ML IJ SOLN
30.0000 mg | Freq: Once | INTRAMUSCULAR | Status: AC | PRN
  Administered 2014-12-06: 30 mg via INTRAVENOUS

## 2014-12-06 MED ORDER — FENTANYL CITRATE 0.05 MG/ML IJ SOLN
25.0000 ug | INTRAMUSCULAR | Status: DC | PRN
  Administered 2014-12-06 (×3): 50 ug via INTRAVENOUS

## 2014-12-06 MED ORDER — HYDROMORPHONE HCL 2 MG PO TABS
ORAL_TABLET | ORAL | Status: AC
  Filled 2014-12-06: qty 1

## 2014-12-06 MED ORDER — BUPIVACAINE-EPINEPHRINE 0.25% -1:200000 IJ SOLN
INTRAMUSCULAR | Status: DC | PRN
  Administered 2014-12-06: 20 mL

## 2014-12-06 MED ORDER — HYDROMORPHONE HCL 2 MG PO TABS: 4.0000 mg | ORAL_TABLET | ORAL | Status: DC | PRN

## 2014-12-06 MED ORDER — FENTANYL CITRATE 0.05 MG/ML IJ SOLN
INTRAMUSCULAR | Status: AC
  Filled 2014-12-06: qty 4

## 2014-12-06 MED ORDER — ONDANSETRON HCL 4 MG/2ML IJ SOLN
INTRAMUSCULAR | Status: DC | PRN
Start: 2014-12-06 — End: 2014-12-06
  Administered 2014-12-06: 4 mg via INTRAVENOUS

## 2014-12-06 MED ORDER — KETOROLAC TROMETHAMINE 30 MG/ML IJ SOLN
INTRAMUSCULAR | Status: AC
  Filled 2014-12-06: qty 1

## 2014-12-06 MED ORDER — SUCCINYLCHOLINE CHLORIDE 20 MG/ML IJ SOLN
INTRAMUSCULAR | Status: DC | PRN
  Administered 2014-12-06: 100 mg via INTRAVENOUS

## 2014-12-06 SURGICAL SUPPLY — 79 items
BLADE 4.2CUDA (BLADE) ×2 IMPLANT
BLADE AVERAGE 25X9 (BLADE) IMPLANT
BLADE SURG 15 STRL LF DISP TIS (BLADE) IMPLANT
BLADE SURG 15 STRL SS (BLADE)
BUR EGG 3PK/BX (BURR) IMPLANT
BUR OVAL 6.0 (BURR) ×1 IMPLANT
CANNULA SHOULDER 7CM (CANNULA) ×2 IMPLANT
CANNULA TWIST IN 8.25X7CM (CANNULA) IMPLANT
DECANTER SPIKE VIAL GLASS SM (MISCELLANEOUS) IMPLANT
DRAPE INCISE IOBAN 66X45 STRL (DRAPES) ×2 IMPLANT
DRAPE SHOULDER BEACH CHAIR (DRAPES) ×2 IMPLANT
DRAPE SURG 17X23 STRL (DRAPES) ×2 IMPLANT
DRAPE U-SHAPE 47X51 STRL (DRAPES) ×2 IMPLANT
DRSG EMULSION OIL 3X3 NADH (GAUZE/BANDAGES/DRESSINGS) ×2 IMPLANT
DRSG PAD ABDOMINAL 8X10 ST (GAUZE/BANDAGES/DRESSINGS) ×4 IMPLANT
DURAPREP 26ML APPLICATOR (WOUND CARE) ×2 IMPLANT
ELECT NDL TIP 2.8 STRL (NEEDLE) ×1 IMPLANT
ELECT NEEDLE TIP 2.8 STRL (NEEDLE) IMPLANT
ELECT REM PT RETURN 9FT ADLT (ELECTROSURGICAL) ×2
ELECTRODE REM PT RTRN 9FT ADLT (ELECTROSURGICAL) ×1 IMPLANT
GAUZE SPONGE 4X4 12PLY STRL (GAUZE/BANDAGES/DRESSINGS) ×2 IMPLANT
GLOVE BIO SURGEON STRL SZ8 (GLOVE) ×2 IMPLANT
GLOVE BIOGEL M STRL SZ7.5 (GLOVE) ×2 IMPLANT
GLOVE BIOGEL PI IND STRL 7.0 (GLOVE) IMPLANT
GLOVE BIOGEL PI IND STRL 7.5 (GLOVE) IMPLANT
GLOVE BIOGEL PI INDICATOR 7.0 (GLOVE) ×1
GLOVE BIOGEL PI INDICATOR 7.5 (GLOVE) ×1
GLOVE ECLIPSE 6.5 STRL STRAW (GLOVE) ×1 IMPLANT
GLOVE EXAM NITRILE LRG STRL (GLOVE) ×1 IMPLANT
GLOVE SURG SS PI 7.5 STRL IVOR (GLOVE) ×1 IMPLANT
GOWN STRL REUS W/ TWL LRG LVL3 (GOWN DISPOSABLE) ×1 IMPLANT
GOWN STRL REUS W/ TWL XL LVL3 (GOWN DISPOSABLE) ×1 IMPLANT
GOWN STRL REUS W/TWL LRG LVL3 (GOWN DISPOSABLE) ×2
GOWN STRL REUS W/TWL XL LVL3 (GOWN DISPOSABLE) ×2
NDL HYPO 18GX1.5 BLUNT FILL (NEEDLE) IMPLANT
NDL SCORPION MULTI FIRE (NEEDLE) IMPLANT
NDL SUT 6 .5 CRC .975X.05 MAYO (NEEDLE) IMPLANT
NEEDLE HYPO 18GX1.5 BLUNT FILL (NEEDLE) ×2 IMPLANT
NEEDLE MAYO TAPER (NEEDLE)
NEEDLE SCORPION MULTI FIRE (NEEDLE) IMPLANT
NS IRRIG 1000ML POUR BTL (IV SOLUTION) IMPLANT
PACK ARTHROSCOPY DSU (CUSTOM PROCEDURE TRAY) ×2 IMPLANT
PACK BASIN DAY SURGERY FS (CUSTOM PROCEDURE TRAY) ×2 IMPLANT
PASSER SUT SWANSON 36MM LOOP (INSTRUMENTS) IMPLANT
PENCIL BUTTON HOLSTER BLD 10FT (ELECTRODE) ×1 IMPLANT
SET ARTHROSCOPY TUBING (MISCELLANEOUS) ×2
SET ARTHROSCOPY TUBING LN (MISCELLANEOUS) ×1 IMPLANT
SLEEVE SCD COMPRESS KNEE MED (MISCELLANEOUS) ×2 IMPLANT
SLING ARM FOAM STRAP LRG (SOFTGOODS) ×1 IMPLANT
SLING ARM IMMOBILIZER LRG (SOFTGOODS) IMPLANT
SLING ARM LRG ADULT FOAM STRAP (SOFTGOODS) IMPLANT
SLING ARM MED ADULT FOAM STRAP (SOFTGOODS) IMPLANT
SLING ARM XL FOAM STRAP (SOFTGOODS) IMPLANT
SPONGE LAP 4X18 X RAY DECT (DISPOSABLE) ×1 IMPLANT
STRIP CLOSURE SKIN 1/2X4 (GAUZE/BANDAGES/DRESSINGS) IMPLANT
SUCTION FRAZIER TIP 10 FR DISP (SUCTIONS) ×1 IMPLANT
SUT BONE WAX W31G (SUTURE) IMPLANT
SUT ETHIBOND 2 OS 4 DA (SUTURE) IMPLANT
SUT FIBERWIRE #2 38 T-5 BLUE (SUTURE)
SUT PDS AB 0 CT 36 (SUTURE) IMPLANT
SUT PROLENE 3 0 PS 2 (SUTURE) IMPLANT
SUT PROLENE 4 0 PS 2 18 (SUTURE) ×2 IMPLANT
SUT RETRIEVER MED (INSTRUMENTS) IMPLANT
SUT TIGER TAPE 7 IN WHITE (SUTURE) IMPLANT
SUT VIC AB 0 CT1 27 (SUTURE)
SUT VIC AB 0 CT1 27XBRD ANBCTR (SUTURE) IMPLANT
SUT VIC AB 2-0 CT1 27 (SUTURE)
SUT VIC AB 2-0 CT1 TAPERPNT 27 (SUTURE) IMPLANT
SUT VIC AB 2-0 SH 18 (SUTURE) IMPLANT
SUT VIC AB 3-0 PS1 18 (SUTURE)
SUT VIC AB 3-0 PS1 18XBRD (SUTURE) IMPLANT
SUTURE FIBERWR #2 38 T-5 BLUE (SUTURE) IMPLANT
SYR BULB 3OZ (MISCELLANEOUS) ×1 IMPLANT
TAPE FIBER 2MM 7IN #2 BLUE (SUTURE) IMPLANT
TOWEL OR 17X24 6PK STRL BLUE (TOWEL DISPOSABLE) ×3 IMPLANT
TOWEL OR NON WOVEN STRL DISP B (DISPOSABLE) ×1 IMPLANT
TUBE CONNECTING 20X1/4 (TUBING) ×2 IMPLANT
WAND STAR VAC 90 (SURGICAL WAND) ×2 IMPLANT
YANKAUER SUCT BULB TIP NO VENT (SUCTIONS) IMPLANT

## 2014-12-06 NOTE — Anesthesia Procedure Notes (Addendum)
Anesthesia Regional Block:  Interscalene brachial plexus block  Pre-Anesthetic Checklist: ,, timeout performed, Correct Patient, Correct Site, Correct Laterality, Correct Procedure, Correct Position, site marked, Risks and benefits discussed,  Surgical consent,  Pre-op evaluation,  At surgeon's request and post-op pain management  Laterality: Right  Prep: chloraprep       Needles:  Injection technique: Single-shot  Needle Type: Stimiplex     Needle Length: 9cm 9 cm Needle Gauge: 21 and 21 G    Additional Needles:  Procedures: ultrasound guided (picture in chart) Interscalene brachial plexus block Narrative:  Start time: 12/06/2014 7:10 AM End time: 12/06/2014 7:21 AM Injection made incrementally with aspirations every 5 mL.  Performed by: Personally  Anesthesiologist: ROSE, Iona Beard  Additional Notes: Patient tolerated the procedure well without complications   Procedure Name: Intubation Date/Time: 12/06/2014 7:51 AM Performed by: Lyndee Leo Pre-anesthesia Checklist: Patient identified, Emergency Drugs available, Suction available and Patient being monitored Patient Re-evaluated:Patient Re-evaluated prior to inductionOxygen Delivery Method: Circle System Utilized Preoxygenation: Pre-oxygenation with 100% oxygen Intubation Type: IV induction Ventilation: Mask ventilation without difficulty Laryngoscope Size: Mac and 3 Tube type: Oral Tube size: 7.0 mm Number of attempts: 2 Airway Equipment and Method: Stylet,  Oral airway and Video-laryngoscopy Placement Confirmation: ETT inserted through vocal cords under direct vision,  positive ETCO2 and breath sounds checked- equal and bilateral Secured at: 22 cm Tube secured with: Tape Dental Injury: Teeth and Oropharynx as per pre-operative assessment  Difficulty Due To: Difficulty was anticipated, Difficult Airway- due to reduced neck mobility and Difficult Airway- due to limited oral opening Future Recommendations:  Recommend- induction with short-acting agent, and alternative techniques readily available

## 2014-12-06 NOTE — Transfer of Care (Signed)
Immediate Anesthesia Transfer of Care Note  Patient: [name]  Procedure(s) Performed: Procedure(s): RIGHT SHOULDER ARTHROSCOPY WITH SUBACROMIAL DECOMPRESSION, BURSECTOMY BICEP TENOTOMY POSSIBLE DISTAL CLAVICLE RESECTION ROTATOR CUFF REPAIR AND DEBRIDEMENT AS NECESSARY  (Right)  Patient Location: PACU  Anesthesia Type:GA combined with regional for post-op pain  Level of Consciousness: awake, sedated and patient cooperative  Airway & Oxygen Therapy: Patient Spontanous Breathing and Patient connected to face mask oxygen  Post-op Assessment: Report given to RN and Post -op Vital signs reviewed and stable  Post vital signs: Reviewed and stable  Last Vitals:  Filed Vitals:   12/06/14 0716  BP:   Pulse: 68  Temp:   Resp: 12    Complications: No apparent anesthesia complications

## 2014-12-06 NOTE — Anesthesia Preprocedure Evaluation (Addendum)
Anesthesia Evaluation  Patient identified by MRN, date of birth, ID band Patient awake    Reviewed: Allergy & Precautions, NPO status , Patient's Chart, lab work & pertinent test results  Airway Mallampati: II  TM Distance: >3 FB Neck ROM: Full    Dental no notable dental hx.    Pulmonary former smoker,  breath sounds clear to auscultation  Pulmonary exam normal       Cardiovascular hypertension, Rhythm:Regular Rate:Normal     Neuro/Psych  Headaches, Depression    GI/Hepatic GERD-  ,(+) Hepatitis -, C  Endo/Other  diabetes, Type 2  Renal/GU      Musculoskeletal  (+) Arthritis -,   Abdominal   Peds  Hematology   Anesthesia Other Findings   Reproductive/Obstetrics                           Anesthesia Physical Anesthesia Plan  ASA: III  Anesthesia Plan: General   Post-op Pain Management:    Induction: Intravenous  Airway Management Planned: Oral ETT  Additional Equipment:   Intra-op Plan:   Post-operative Plan: Extubation in OR  Informed Consent: I have reviewed the patients History and Physical, chart, labs and discussed the procedure including the risks, benefits and alternatives for the proposed anesthesia with the patient or authorized representative who has indicated his/her understanding and acceptance.   Dental advisory given  Plan Discussed with: CRNA and Surgeon  Anesthesia Plan Comments:         Anesthesia Quick Evaluation

## 2014-12-06 NOTE — H&P (Signed)
[name] is an 59 y.o. female.   Chief Complaint:right shoulder pain HPI: Patient presents for evaluation and treatment of the of their upper extremity predicament. The patient denies neck back chest or of abdominal pain. The patient notes that they have no lower extremity problems. The patient from primarily complains of the upper extremity pain noted.  Past Medical History  Diagnosis Date  . Endometriosis 1980's    Dr. Warnell Forester  . Osteopenia   . Thyroid disease   . Hepatitis     genotype 1b hepatitis C virus, monitored by [facility]  . Hypertension   . History of hepatitis C   . GERD (gastroesophageal reflux disease)   . Arthritis   . DDD (degenerative disc disease), lumbar   . Diabetes mellitus 1-13    BORDERLINE DR. BALAN-diet controlled    Past Surgical History  Procedure Laterality Date  . Breast surgery  1983    left breast cyst removed  . Cholecystectomy  ?2002  . Laparoscopic endometriosis fulguration  1980's    Dr. Warnell Forester  . Lumbar hemilaminectomy  01/1989    L5-S1 on right, Dr. Ardeen Jourdain  . Lumbar disc surgery  08/13/2003    Diskectomy/fusion L5-S1 rt., Dr. Maia Plan  . Spinal cord stimulator insertion  12/29/2004    leads placed along spine from T8 to T10, Dr. Maia Plan  . Spinal cord stimulator pulse generator  01/05/2005    placed in right upper buttocks, Dr. Maia Plan  . Anterior cervical decompression and fusion with a reflex hyper plate  92/10/1939    D4-Y8 and C5-C6 , Dr. Maia Plan  . Cervical fusion w/oasis system infuse  05/13/2006    C4-C5 and C5-C6 and C6-7,  Dr. Maia Plan  . Nasal sinus surgery  10/30/2007    Left upper Sinus lift,  Dr. Mingo Amber  . Esophageal dilation      Dr. Amedeo Plenty  . Liver biopsy  2002  . Tonsillectomy    . Colonoscopy      Family History  Problem Relation Age of Onset  . Hypertension Father   . Cancer Father     colon, liver  . Hypertension Sister   . Hypertension Brother   . Cancer Brother     lung, adrenal glands and  brain  . Hypertension Brother    Social History:  reports that she quit smoking about 23 years ago. She has never used smokeless tobacco. She reports that she drinks alcohol. She reports that she does not use illicit drugs.  Allergies:  Allergies  Allergen Reactions  . Duloxetine Other (See Comments)     INCREASED LFTs  . Sumatriptan Other (See Comments)    Hyperventilating, whole body turned red, almost passed out  . Pentazocine Lactate   . Acetic Acid-Oxyquinoline Other (See Comments)    FEM PH; caused (vaginal) bleeding  . Codeine Phosphate Nausea Only  . Morphine And Related Nausea Only and Rash  . Oxycodone-Acetaminophen Nausea Only    Can take with food on stomach     Medications Prior to Admission  Medication Sig Dispense Refill  . FLUoxetine (PROZAC) 20 MG capsule Take 20 mg by mouth daily.    . fluticasone (FLONASE) 50 MCG/ACT nasal spray Place 2 sprays into the nose daily.    Marland Kitchen levothyroxine (SYNTHROID, LEVOTHROID) 112 MCG tablet Take 112 mcg by mouth daily before breakfast.    . lisinopril-hydrochlorothiazide (PRINZIDE,ZESTORETIC) 20-12.5 MG per tablet Take 1 tablet by mouth daily.    Marland Kitchen  meloxicam (MOBIC) 7.5 MG tablet Take 7.5 mg by mouth. 2 qd     . omeprazole (PRILOSEC) 20 MG capsule Take 20 mg by mouth 2 (two) times daily.      . polyethylene glycol (MIRALAX / GLYCOLAX) packet Take 17 g by mouth as needed.      . traZODone (DESYREL) 50 MG tablet Take 50 mg by mouth. 1/2 - 1 prn qhs     . lidocaine (XYLOCAINE) 2 % jelly Apply 1 application topically as needed. 30 mL 3  . Multiple Vitamin (MULTIVITAMIN) tablet Take 1 tablet by mouth daily.      Marland Kitchen nystatin cream (MYCOSTATIN) Apply 1 application topically 2 (two) times daily. 30 g 1  . rizatriptan (MAXALT) 10 MG tablet Take 10 mg by mouth as needed. May repeat in 2 hours if needed       No results found for this or any previous visit (from the past 48 hour(s)). No results found.  Review of Systems  Constitutional:  Negative.   HENT: Negative.   Respiratory: Negative.   Genitourinary: Negative.   Neurological: Negative.     Blood pressure 116/50, pulse 68, temperature 98.1 F (36.7 C), temperature source Oral, resp. rate 12, height 5\' 7"  (1.702 m), weight 85.985 kg (189 lb 9 oz), last menstrual period 06/24/2012, SpO2 100 %. Physical Exam  Right shoulder impingement with pain and loss of function  The patient is alert and oriented in no acute distress the patient complains of pain in the affected upper extremity.  The patient is noted to have a normal HEENT exam.  Lung fields show equal chest expansion and no shortness of breath  abdomen exam is nontender without distention.  Lower extremity examination does not show any fracture dislocation or blood clot symptoms.  Pelvis is stable neck and back are stable and nontender  Assessment/Plan Plan for Ascope right shoulder and repair reconstruction as necessary  We are planning surgery for your upper extremity. The risk and benefits of surgery include risk of bleeding infection anesthesia damage to normal structures and failure of the surgery to accomplish its intended goals of relieving symptoms and restoring function with this in mind we'll going to proceed. I have specifically discussed with the patient the pre-and postoperative regime and the does and don'ts and risk and benefits in great detail. Risk and benefits of surgery also include risk of dystrophy chronic nerve pain failure of the healing process to go onto completion and other inherent risks of surgery The relavent the pathophysiology of the disease/injury process, as well as the alternatives for treatment and postoperative course of action has been discussed in great detail with the patient who desires to proceed.  We will do everything in our power to help you (the patient) restore function to the upper extremity. Is a pleasure to see this patient today.  Paulene Floor 12/06/2014,  7:37 AM

## 2014-12-06 NOTE — Discharge Instructions (Signed)
Keep bandage clean and dry.  Call for any problems.  No smoking.  Criteria for driving a car: you should be off your pain medicine for 7-8 hours, able to drive one handed(confident), thinking clearly and feeling able in your judgement to drive. Continue elevation as it will decrease swelling.  If instructed by MD move your fingers within the confines of the bandage/splint.  Use ice if instructed by your MD. Call immediately for any sudden loss of feeling in your hand/arm or change in functional abilities of the extremity.   We recommend that you to take vitamin C 1000 mg a day to promote healing we also recommend that if you require her pain medicine that he take a stool softener to prevent constipation as most pain medicines will have constipation side effects. We recommend either Peri-Colace or Senokot and recommend that you also consider adding MiraLAX to prevent the constipation affects from pain medicine if you are required to use them. These medicines are over the counter and maybe purchased at a local pharmacy.  Post Anesthesia Home Care Instructions  Activity: Get plenty of rest for the remainder of the day. A responsible adult should stay with you for 24 hours following the procedure.  For the next 24 hours, DO NOT: -Drive a car -Paediatric nurse -Drink alcoholic beverages -Take any medication unless instructed by your physician -Make any legal decisions or sign important papers.  Meals: Start with liquid foods such as gelatin or soup. Progress to regular foods as tolerated. Avoid greasy, spicy, heavy foods. If nausea and/or vomiting occur, drink only clear liquids until the nausea and/or vomiting subsides. Call your physician if vomiting continues.  Special Instructions/Symptoms: Your throat may feel dry or sore from the anesthesia or the breathing tube placed in your throat during surgery. If this causes discomfort, gargle with warm salt water. The discomfort should disappear within  24 hours. Regional Anesthesia Blocks  1. Numbness or the inability to move the "blocked" extremity may last from 3-48 hours after placement. The length of time depends on the medication injected and your individual response to the medication. If the numbness is not going away after 48 hours, call your surgeon.  2. The extremity that is blocked will need to be protected until the numbness is gone and the  Strength has returned. Because you cannot feel it, you will need to take extra care to avoid injury. Because it may be weak, you may have difficulty moving it or using it. You may not know what position it is in without looking at it while the block is in effect.  3. For blocks in the legs and feet, returning to weight bearing and walking needs to be done carefully. You will need to wait until the numbness is entirely gone and the strength has returned. You should be able to move your leg and foot normally before you try and bear weight or walk. You will need someone to be with you when you first try to ensure you do not fall and possibly risk injury.  4. Bruising and tenderness at the needle site are common side effects and will resolve in a few days.  5. Persistent numbness or new problems with movement should be communicated to the surgeon or the [place] [id number] [place] [phone]).

## 2014-12-06 NOTE — Progress Notes (Signed)
Assisted Dr. Rose with right, ultrasound guided, interscalene  block. Side rails up, monitors on throughout procedure. See vital signs in flow sheet. Tolerated Procedure well. 

## 2014-12-06 NOTE — Anesthesia Postprocedure Evaluation (Signed)
  Anesthesia Post-op Note  Patient: [name]  Procedure(s) Performed: Procedure(s) (LRB): SHOULDER ARTHROSCOPY WITH SUBACROMIAL DECOMPRESSION; EXCISION BONE SPUR (Right)  Patient Location: PACU  Anesthesia Type: GA combined with regional for post-op pain  Level of Consciousness: awake and alert   Airway and Oxygen Therapy: Patient Spontanous Breathing  Post-op Pain: mild  Post-op Assessment: Post-op Vital signs reviewed, Patient's Cardiovascular Status Stable, Respiratory Function Stable, Patent Airway and No signs of Nausea or vomiting  Last Vitals:  Filed Vitals:   12/06/14 0930  BP: 107/55  Pulse: 72  Temp:   Resp: 15    Post-op Vital Signs: stable   Complications: No apparent anesthesia complications

## 2014-12-06 NOTE — Op Note (Signed)
See dictaion# [id number] Amedeo Plenty MD

## 2014-12-09 ENCOUNTER — Encounter (HOSPITAL_BASED_OUTPATIENT_CLINIC_OR_DEPARTMENT_OTHER): Payer: Self-pay | Admitting: Orthopedic Surgery

## 2014-12-09 NOTE — Op Note (Signed)
[name], Nelson NO.:  [id number]  MEDICAL RECORD NO.:  [id number]  LOCATION:                               FACILITY:  [facility]  PHYSICIAN:  Satira Anis. Keairra Bardon, M.D.DATE OF BIRTH:  03-05-1956  DATE OF PROCEDURE: DATE OF DISCHARGE:  12/06/2014                              OPERATIVE REPORT   PREOPERATIVE DIAGNOSIS:  Chronic right shoulder impingement.  POSTOPERATIVE DIAGNOSIS:  Chronic right shoulder impingement with large subacromial and acromioclavicular joint spur process with associated bursitis, intact biceps tendon, intact rotator cuff.  SURGICAL PROCEDURES PERFORMED: 1. Evaluation under anesthesia, right shoulder. 2. Right shoulder glenohumeral arthroscopy with labral debridement of     a non-destabilizing anterior degenerative tear.  Intact rotator     cuff.  Intact biceps and anchor noted. 3. Arthroscopic subacromial decompression and bursectomy, right     shoulder. 4. Arthroscopic distal clavicle resection, right shoulder.  SURGEON:  Satira Anis. Amedeo Plenty, M.D.  ASSISTANT:  None.  COMPLICATIONS:  None.  ANESTHESIA:  General, preoperative block.  TOURNIQUET TIME:  Zero.  ESTIMATED BLOOD LOSS:  Minimal.  INDICATIONS:  A 59 year old female with longstanding history of degenerative cervical and lumbar spine issues.  She presents for intractable right shoulder pain.  She has failed injections.  CT reveals an intact cuff.  I have discussed her options, risks, benefits, do's and don'ts.  With this in mind, she desires to proceed.  DESCRIPTION OF PROCEDURE:  The patient was seen by myself and Anesthesia, taken to the operative suite, underwent smooth induction of general anesthetic, laid in the beach chair position, appropriately padded, prepped and draped in usual sterile fashion.  Betadine scrubbed and painted.  Once this was complete, the patient then underwent a very careful and cautious approach to the extremity with evaluation  under anesthesia.  Good stability was noted.  Outline marks were made.  I should note, she had a pre-scrub by myself followed by DuraPrep followed by sterile drape.  Once this was complete, posterolateral portal was made, but prior to doing this, I insufflated the joint with saline.  A soft spot was noted and arthroscope was introduced.  Anterior working portal was then created with needle localization technique.  Once arthroscopic anterior and posterior portals were secured, we then performed systematic arthroscopy. Anterior and posterior aspects of the joint looked excellent.  There was no advanced condylar or glenohumeral joint degenerative change.  Rotator cuff was intact.  Supraspinous and infraspinatus were intact.  She had a little bit of fraying of the subscapularis, which I had trimmed, and this was stable and non-destabilizing.  The biceps and the biceps anchor looked excellent.  The anterior and posterior labral regions looked stable.  There is a little bit of anterior degenerative change and I debrided this with arthroscopic shaver without difficulty to a stable base.  This did not encroach upon the biceps anchor and the superior labrum was quite stable and asymptomatic in terms of its functional, physical anatomy.  Following this, we then irrigated, gave everything a second look, all looked well.  There was no loose body.  At this juncture, I then placed the arthroscope in the subacromial space, made a  lateral working portal and redirected the anterior portal to the area over the distal clavicle.  With combination of arthroscopic ablator, shaver, and burr, I performed a subacromial decompression and bursectomy.  She had a really impressive spur overlying the AC joint, which was resected, this was off the acromion.  Once this was removed, then I could uncover the [employer] joint which was resected with arthroscopic burr, shaver, and ablator.  Thus, orthoscopic distal clavicle  resection and subacromial decompression was accomplished without difficulty.  She tolerated this well and there were no complicating features.  The patient had no issues.  All sponge, needle, and instrument counts were reported as correct.  I did use pump pressure during the case of course.  At the conclusion of the case, we decreased the pump pressure. The bleeding was excellently contained, it was, all looked well, and I was pleased.  At this juncture, we then closed the portal sites, placed 15 mL of Sensorcaine with epi in the portal sites and sterile bandage was applied after the portals were closed with Prolene stitching.  She tolerated the procedure well, was seen in the recovery room __________ seek therapy for a gentle interval range of motion and other measures in the upcoming week, and I will see her tomorrow for dressing change in my office.  These notes have been discussed.  All questions have been encouraged and answered.  Should any problems arise, she will notify us. It is pleasure to see her today.  All questions have been encouraged and answered.  Her protocol postoperatively will be immediate range of motion and therapeutic endeavors.     Satira Anis. Amedeo Plenty, M.D.     [facility]  D:  12/06/2014  T:  12/07/2014  Job:  [id number]

## 2014-12-13 ENCOUNTER — Encounter (HOSPITAL_COMMUNITY): Payer: Self-pay

## 2014-12-13 ENCOUNTER — Other Ambulatory Visit: Payer: Self-pay | Admitting: Orthopedic Surgery

## 2014-12-13 ENCOUNTER — Encounter (HOSPITAL_BASED_OUTPATIENT_CLINIC_OR_DEPARTMENT_OTHER): Payer: Self-pay | Admitting: Orthopedic Surgery

## 2014-12-13 ENCOUNTER — Ambulatory Visit (HOSPITAL_COMMUNITY)
Admission: RE | Admit: 2014-12-13 | Discharge: 2014-12-13 | Disposition: A | Discharge status: Home / Self Care | Payer: Medicare Other | Source: Ambulatory Visit | Attending: Orthopedic Surgery | Admitting: Orthopedic Surgery

## 2014-12-13 DIAGNOSIS — L0390 Cellulitis, unspecified: Secondary | ICD-10-CM | POA: Insufficient documentation

## 2014-12-13 HISTORY — DX: Presence of other specified functional implants: Z96.89

## 2014-12-13 MED ORDER — CEFAZOLIN SODIUM-DEXTROSE 2-3 GM-% IV SOLR
2.0000 g | Freq: Once | INTRAVENOUS | Status: AC
  Administered 2014-12-13: 2 g via INTRAVENOUS
  Filled 2014-12-13: qty 50

## 2014-12-13 MED ORDER — SODIUM CHLORIDE 0.9 % IV SOLN
Freq: Once | INTRAVENOUS | Status: AC
  Administered 2014-12-13: 13:00:00 via INTRAVENOUS

## 2014-12-13 NOTE — Progress Notes (Signed)
Uneventful infusion of IV Cefazolin. 15 minute post infusion observation. Pt discharged ambulatory accompanied by husband to main lobby

## 2014-12-13 NOTE — Discharge Instructions (Signed)
Cefazolin injection What is this medicine? CEFAZOLIN (sef A zoe lin) is a cephalosprin antibiotic. It is used to treat or prevent certain kinds of bacterial infections. It will not work for colds, flu, or other viral infections. This medicine may be used for other purposes; ask your health care provider or pharmacist if you have questions. COMMON BRAND NAME(S): Ancef, Kefzol What should I tell my health care provider before I take this medicine? They need to know if you have any of these conditions: -bleeding problems -diarrhea -kidney disease -liver disease -stomach or intestine problems (especially colitis) -an unusual or allergic reaction to cefazolin, other cephalosporin antibiotics, penicillin, penicillamine, other foods, dyes or preservatives -pregnant or trying to get pregnant -breast-feeding How should I use this medicine? This medicine is infused into a vein. Do not use your medicine more often than directed. Finish the full course prescribed by your doctor or health care professional even if you feel better. Do not stop using except on your doctor's advice. Talk to your pediatrician regarding the use of this medicine in children. Special care may be needed. This medicine had been used in children as young as 66 month old. Overdosage: If you think you have taken too much of this medicine contact a poison control center or emergency room at once. NOTE: This medicine is only for you. Do not share this medicine with others. What if I miss a dose? If you miss a dose, use it as soon as you can. If it is almost time for your next dose, use only that dose. Do not use double or extra doses. What may interact with this medicine? -birth control pills -blood thinners -other antibiotics -probenecid This list may not describe all possible interactions. Give your health care provider a list of all the medicines, herbs, non-prescription drugs, or dietary supplements you use. Also tell them if  you smoke, drink alcohol, or use illegal drugs. Some items may interact with your medicine. What should I watch for while using this medicine? Tell your doctor or health care professional if your symptoms do not get better in a few days. If you have diabetes you might get a false-positive result for sugar in your urine. Check with your doctor or health care professional before you change your diet or the dose of your diabetes medicine. What side effects may I notice from receiving this medicine? Side effects that you should report to your doctor or health care professional as soon as possible: -allergic reactions like skin rash, itching or hives, swelling of the face, lips, or tongue -breathing problems -fever or chills -redness, blistering, peeling or loosening of the skin, including inside the mouth -seizures -severe or watery diarrhea -sore throat -stomach pain or cramps -trouble passing urine or change in the amount of urine -unusual bleeding or bruising Side effects that usually do not require medical attention (report to your doctor or health care professional if they continue or are bothersome): -diarrhea -genital or anal irritation -loss of appetite -nausea, vomiting -pain or redness where injected This list may not describe all possible side effects. Call your doctor for medical advice about side effects. You may report side effects to FDA at 1-800-FDA-1088. Where should I keep my medicine? You will be instructed on how to store this medicine. Keep out of the reach of children. NOTE: This sheet is a summary. It may not cover all possible information. If you have questions about this medicine, talk to your doctor, pharmacist, or health care provider.  2015, Elsevier/Gold Standard. (2013-05-10 15:50:01)

## 2015-03-14 ENCOUNTER — Ambulatory Visit (INDEPENDENT_AMBULATORY_CARE_PROVIDER_SITE_OTHER): Payer: Medicare Other | Admitting: Women's Health

## 2015-03-14 ENCOUNTER — Encounter: Payer: Self-pay | Admitting: Women's Health

## 2015-03-14 VITALS — BP 130/78 | Ht 67.0 in

## 2015-03-14 DIAGNOSIS — N898 Other specified noninflammatory disorders of vagina: Secondary | ICD-10-CM

## 2015-03-14 DIAGNOSIS — R350 Frequency of micturition: Secondary | ICD-10-CM | POA: Diagnosis not present

## 2015-03-14 DIAGNOSIS — B373 Candidiasis of vulva and vagina: Secondary | ICD-10-CM

## 2015-03-14 DIAGNOSIS — B3731 Acute candidiasis of vulva and vagina: Secondary | ICD-10-CM

## 2015-03-14 LAB — URINALYSIS W MICROSCOPIC + REFLEX CULTURE
Bilirubin Urine: NEGATIVE
Glucose, UA: NEGATIVE mg/dL
HGB URINE DIPSTICK: NEGATIVE
Leukocytes, UA: NEGATIVE
NITRITE: NEGATIVE
PH: 5.5 (ref 5.0–8.0)
Protein, ur: NEGATIVE mg/dL
SPECIFIC GRAVITY, URINE: 1.02 (ref 1.005–1.030)
UROBILINOGEN UA: 0.2 mg/dL (ref 0.0–1.0)

## 2015-03-14 LAB — WET PREP FOR TRICH, YEAST, CLUE
Clue Cells Wet Prep HPF POC: NONE SEEN
Trich, Wet Prep: NONE SEEN

## 2015-03-14 MED ORDER — FLUCONAZOLE 150 MG PO TABS: 150.0000 mg | ORAL_TABLET | Freq: Once | ORAL | Status: DC

## 2015-03-14 NOTE — Patient Instructions (Signed)

## 2015-03-14 NOTE — Progress Notes (Signed)
Patient ID: [name], female   DOB: [birth year], 59 y.o.   MRN: [id number] Presents with complaint of external vaginal itching, vaginal irritation and dryness. Denies urinary Burning at initiation of stream, denies pain or discomfort at end of stream of urination. Denies abdominal pain or fever. Postmenopausal/no HRT.  Exam: Appears well. External genitalia mild erythema, atrophic, wet prep with a Q-tip positive for yeast. Bimanual no CMT or adnexal tenderness. UA: Negative  Yeast vaginitis  Plan: Diflucan 150 by mouth 1 dose, prescription, proper use given and reviewed call if no relief yeast prevention discussed.

## 2015-03-28 ENCOUNTER — Other Ambulatory Visit: Payer: Medicare Other

## 2015-03-31 ENCOUNTER — Other Ambulatory Visit: Payer: Medicare Other

## 2015-04-06 ENCOUNTER — Encounter (HOSPITAL_COMMUNITY): Payer: Self-pay | Admitting: *Deleted

## 2015-04-06 ENCOUNTER — Emergency Department (HOSPITAL_COMMUNITY)
Admission: EM | Admit: 2015-04-06 | Discharge: 2015-04-06 | Disposition: A | Discharge status: Home / Self Care | Payer: Medicare Other | Source: Home / Self Care | Attending: Family Medicine | Admitting: Family Medicine

## 2015-04-06 DIAGNOSIS — M629 Disorder of muscle, unspecified: Secondary | ICD-10-CM | POA: Diagnosis not present

## 2015-04-06 DIAGNOSIS — S93699A Other sprain of unspecified foot, initial encounter: Secondary | ICD-10-CM

## 2015-04-06 NOTE — Discharge Instructions (Signed)
No going barefoot, use crutches as needed  Use heat and gentle stretch. See dr Drema Dallas as planned.

## 2015-04-06 NOTE — ED Provider Notes (Signed)
CSN: [id number]     Arrival date & time 04/06/15  1417 History   First MD Initiated Contact with Patient 04/06/15 1505     Chief Complaint  Patient presents with  . Foot Problem   (Consider location/radiation/quality/duration/timing/severity/associated sxs/prior Treatment) Patient is a 59 y.o. female presenting with lower extremity pain.  Foot Pain This is a recurrent problem. The current episode started 6 to 12 hours ago (known plantar fasciitis, stepped awkwardly this am with severe foot pain). The problem has been gradually worsening. Pertinent negatives include no chest pain and no abdominal pain. The symptoms are aggravated by walking.    Past Medical History  Diagnosis Date  . Endometriosis 1980's    Dr. Warnell Forester  . Osteopenia   . Thyroid disease   . Hepatitis     genotype 1b hepatitis C virus, monitored by [facility] infectious disease group  . Hypertension   . History of hepatitis C   . GERD (gastroesophageal reflux disease)   . Arthritis   . DDD (degenerative disc disease), lumbar   . Diabetes mellitus 1-13    BORDERLINE DR. BALAN-diet controlled  . Spinal cord stimulator status     placed 2006   Past Surgical History  Procedure Laterality Date  . Breast surgery  1983    left breast cyst removed  . Cholecystectomy  ?2002  . Laparoscopic endometriosis fulguration  1980's    Dr. Warnell Forester  . Lumbar hemilaminectomy  01/1989    L5-S1 on right, Dr. Ardeen Jourdain  . Lumbar disc surgery  08/13/2003    Diskectomy/fusion L5-S1 rt., Dr. Maia Plan  . Spinal cord stimulator insertion  12/29/2004    leads placed along spine from T8 to T10, Dr. Maia Plan  . Spinal cord stimulator pulse generator  01/05/2005    placed in right upper buttocks, Dr. Maia Plan  . Anterior cervical decompression and fusion with a reflex hyper plate  44/0/3474    Q5-Z5 and C5-C6 , Dr. Maia Plan  . Cervical fusion w/oasis system infuse  05/13/2006    C4-C5 and C5-C6 and C6-7,  Dr. Maia Plan  . Nasal sinus surgery  10/30/2007    Left  upper Sinus lift,  Dr. Mingo Amber  . Esophageal dilation      Dr. Amedeo Plenty  . Liver biopsy  2002  . Tonsillectomy    . Colonoscopy    . Shoulder arthroscopy with subacromial decompression Right 12/06/2014    Procedure: SHOULDER ARTHROSCOPY WITH SUBACROMIAL DECOMPRESSION; EXCISION BONE SPUR;  Surgeon: Roseanne Kaufman, MD;  Location: [facility];  Service: Orthopedics;  Laterality: Right;  . Cholecystectomy  2002  . Shoulder surgery Right 12/06/14    "cleaned up bone spurs"   Family History  Problem Relation Age of Onset  . Hypertension Father   . Cancer Father     colon, liver  . Hypertension Sister   . Hypertension Brother   . Cancer Brother     lung, adrenal glands and brain  . Hypertension Brother    History  Substance Use Topics  . Smoking status: Never Smoker   . Smokeless tobacco: Not on file  . Alcohol Use: 0.0 oz/week    0 Standard drinks or equivalent per week     Comment: occ   OB History    Gravida Para Term Preterm AB TAB SAB Ectopic Multiple Living   1 1 0 0 0 0 0 0       Review of Systems  Constitutional: Negative.   Cardiovascular:  Negative for chest pain.  Gastrointestinal: Negative for abdominal pain.  Musculoskeletal: Positive for myalgias and gait problem. Negative for joint swelling.  Skin: Negative.     Allergies  Duloxetine; Sumatriptan; Imitrex; Morphine and related; Pentazocine lactate; Acetic acid-oxyquinoline; Codeine phosphate; Morphine and related; and Oxycodone-acetaminophen  Home Medications   Prior to Admission medications   Medication Sig Start Date End Date Taking? Authorizing Provider  benazepril-hydrochlorthiazide (LOTENSIN HCT) 20-12.5 MG per tablet Take 1 tablet by mouth daily.    Historical Provider, MD  fluconazole (DIFLUCAN) 150 MG tablet Take 1 tablet (150 mg total) by mouth once. 03/14/15   Huel Cote, NP  FLUoxetine (PROZAC) 20 MG capsule Take 20 mg by mouth daily.    Historical Provider, MD  fluticasone (FLONASE)  50 MCG/ACT nasal spray Place 2 sprays into the nose daily.    Historical Provider, MD  fluticasone (VERAMYST) 27.5 MCG/SPRAY nasal spray Place 1 spray into the nose as needed for rhinitis.    Historical Provider, MD  gabapentin (NEURONTIN) 800 MG tablet Take 800 mg by mouth once.    Historical Provider, MD  levothyroxine (SYNTHROID, LEVOTHROID) 112 MCG tablet Take 112 mcg by mouth daily before breakfast.    Historical Provider, MD  lisinopril-hydrochlorothiazide (PRINZIDE,ZESTORETIC) 20-12.5 MG per tablet Take 1 tablet by mouth daily.    Historical Provider, MD  meloxicam (MOBIC) 7.5 MG tablet Take 7.5 mg by mouth 2 (two) times daily.    Historical Provider, MD  Multiple Vitamin (MULTIVITAMIN) tablet Take 1 tablet by mouth daily.      Historical Provider, MD  omeprazole (PRILOSEC) 20 MG capsule Take 20 mg by mouth 2 (two) times daily before a meal.    Historical Provider, MD  polyethylene glycol (MIRALAX / GLYCOLAX) packet Take 17 g by mouth daily as needed.    Historical Provider, MD  rizatriptan (MAXALT) 10 MG tablet Take 10 mg by mouth as needed for migraine. May repeat in 2 hours if needed    Historical Provider, MD   BP 137/85 mmHg  Pulse 74  Temp(Src) 97.8 F (36.6 C) (Oral)  Resp 16  SpO2 99%  LMP 06/24/2012 Physical Exam  Constitutional: She is oriented to person, place, and time. She appears well-developed and well-nourished. No distress.  Musculoskeletal: She exhibits tenderness.       Feet:  Neurological: She is alert and oriented to person, place, and time.  Skin: Skin is warm and dry.  Nursing note and vitals reviewed.   ED Course  Procedures (including critical care time) Labs Review Labs Reviewed - No data to display  Imaging Review No results found.   MDM   1. Rupture of plantar fascia        Billy Fischer, MD 04/06/15 2023

## 2015-04-06 NOTE — ED Notes (Signed)
Pt  Reports    Pain   l     Foot  Stepped      Wrong            And  Felt  Pain  She  Reports  Has  Been  Seen  By  Dr  Drema Dallas    At  [facility]  For  fascittitis

## 2015-05-20 ENCOUNTER — Ambulatory Visit
Admission: RE | Admit: 2015-05-20 | Discharge: 2015-05-20 | Disposition: A | Discharge status: Home / Self Care | Payer: Medicare Other | Source: Ambulatory Visit | Attending: Endocrinology | Admitting: Endocrinology

## 2015-05-20 DIAGNOSIS — E049 Nontoxic goiter, unspecified: Secondary | ICD-10-CM

## 2015-06-05 ENCOUNTER — Other Ambulatory Visit: Payer: Self-pay | Admitting: Endocrinology

## 2015-06-05 DIAGNOSIS — E049 Nontoxic goiter, unspecified: Secondary | ICD-10-CM

## 2015-06-09 ENCOUNTER — Other Ambulatory Visit: Payer: Self-pay

## 2015-06-09 DIAGNOSIS — Z1231 Encounter for screening mammogram for malignant neoplasm of breast: Secondary | ICD-10-CM

## 2015-06-12 ENCOUNTER — Encounter: Payer: Self-pay | Admitting: Women's Health

## 2015-06-12 ENCOUNTER — Ambulatory Visit (INDEPENDENT_AMBULATORY_CARE_PROVIDER_SITE_OTHER): Payer: Medicare Other | Admitting: Women's Health

## 2015-06-12 VITALS — Ht 67.0 in | Wt 187.0 lb

## 2015-06-12 DIAGNOSIS — N898 Other specified noninflammatory disorders of vagina: Secondary | ICD-10-CM

## 2015-06-12 DIAGNOSIS — R350 Frequency of micturition: Secondary | ICD-10-CM

## 2015-06-12 LAB — WET PREP FOR TRICH, YEAST, CLUE
Clue Cells Wet Prep HPF POC: NONE SEEN
Trich, Wet Prep: NONE SEEN
Yeast Wet Prep HPF POC: NONE SEEN

## 2015-06-12 LAB — URINALYSIS W MICROSCOPIC + REFLEX CULTURE
BILIRUBIN URINE: NEGATIVE
Bacteria, UA: NONE SEEN [HPF]
CASTS: NONE SEEN [LPF]
Crystals: NONE SEEN [HPF]
Glucose, UA: NEGATIVE
Hgb urine dipstick: NEGATIVE
KETONES UR: NEGATIVE
Leukocytes, UA: NEGATIVE
Nitrite: NEGATIVE
Protein, ur: NEGATIVE
RBC / HPF: NONE SEEN RBC/HPF (ref <=2)
SPECIFIC GRAVITY, URINE: 1.01 (ref 1.001–1.035)
WBC UA: NONE SEEN WBC/HPF (ref <=5)
Yeast: NONE SEEN [HPF]
pH: 7 (ref 5.0–8.0)

## 2015-06-12 NOTE — Progress Notes (Signed)
Patient ID: [name], female   DOB: 08-18-1956, 59 y.o.   MRN: [id number] Presents with the complaint of increased vaginal burning, itching and under arm odor. Has recently had thyroid medication adjusted. Denies urinary symptoms, abdominal pain or fever. Severe dryness with intercourse. Continues to have lower leg and back pain has follow-up scheduled.  Exam: Appears uncomfortable. External genitalia minimal erythema, speculum exam atrophic, minimal erythema, no discharge noted.  wet prep negative. UA: Negative No body odor noted  Vaginal atrophy  Plan: Reviewed postmenopausal vaginal atrophy, importance of lubricants with intercourse, loose clothing, applying small amount of A&D ointment after showers. Reviewed no noted body odor.

## 2015-06-23 ENCOUNTER — Encounter (HOSPITAL_COMMUNITY): Payer: Self-pay | Admitting: Emergency Medicine

## 2015-06-23 ENCOUNTER — Emergency Department (HOSPITAL_COMMUNITY)
Admission: EM | Admit: 2015-06-23 | Discharge: 2015-06-23 | Disposition: A | Discharge status: Home / Self Care | Payer: Medicare Other | Attending: Emergency Medicine | Admitting: Emergency Medicine

## 2015-06-23 DIAGNOSIS — M5440 Lumbago with sciatica, unspecified side: Secondary | ICD-10-CM | POA: Diagnosis not present

## 2015-06-23 DIAGNOSIS — Z791 Long term (current) use of non-steroidal anti-inflammatories (NSAID): Secondary | ICD-10-CM | POA: Diagnosis not present

## 2015-06-23 DIAGNOSIS — I10 Essential (primary) hypertension: Secondary | ICD-10-CM | POA: Insufficient documentation

## 2015-06-23 DIAGNOSIS — Z9889 Other specified postprocedural states: Secondary | ICD-10-CM | POA: Diagnosis not present

## 2015-06-23 DIAGNOSIS — Z79899 Other long term (current) drug therapy: Secondary | ICD-10-CM | POA: Diagnosis not present

## 2015-06-23 DIAGNOSIS — M1990 Unspecified osteoarthritis, unspecified site: Secondary | ICD-10-CM | POA: Diagnosis not present

## 2015-06-23 DIAGNOSIS — K219 Gastro-esophageal reflux disease without esophagitis: Secondary | ICD-10-CM | POA: Diagnosis not present

## 2015-06-23 DIAGNOSIS — Z8742 Personal history of other diseases of the female genital tract: Secondary | ICD-10-CM | POA: Insufficient documentation

## 2015-06-23 DIAGNOSIS — E119 Type 2 diabetes mellitus without complications: Secondary | ICD-10-CM | POA: Diagnosis not present

## 2015-06-23 DIAGNOSIS — Z7951 Long term (current) use of inhaled steroids: Secondary | ICD-10-CM | POA: Diagnosis not present

## 2015-06-23 DIAGNOSIS — E079 Disorder of thyroid, unspecified: Secondary | ICD-10-CM | POA: Insufficient documentation

## 2015-06-23 DIAGNOSIS — Z8619 Personal history of other infectious and parasitic diseases: Secondary | ICD-10-CM | POA: Diagnosis not present

## 2015-06-23 DIAGNOSIS — M5442 Lumbago with sciatica, left side: Secondary | ICD-10-CM

## 2015-06-23 DIAGNOSIS — M545 Low back pain: Secondary | ICD-10-CM | POA: Diagnosis present

## 2015-06-23 DIAGNOSIS — M5441 Lumbago with sciatica, right side: Secondary | ICD-10-CM

## 2015-06-23 MED ORDER — OXYCODONE-ACETAMINOPHEN 5-325 MG PO TABS
1.0000 | ORAL_TABLET | Freq: Once | ORAL | Status: AC
  Administered 2015-06-23: 1 via ORAL
  Filled 2015-06-23: qty 1

## 2015-06-23 MED ORDER — KETOROLAC TROMETHAMINE 60 MG/2ML IM SOLN
60.0000 mg | Freq: Once | INTRAMUSCULAR | Status: AC
  Administered 2015-06-23: 60 mg via INTRAMUSCULAR
  Filled 2015-06-23: qty 2

## 2015-06-23 MED ORDER — OXYCODONE-ACETAMINOPHEN 5-325 MG PO TABS: 1.0000 | ORAL_TABLET | ORAL | Status: DC | PRN

## 2015-06-23 MED ORDER — HYDROMORPHONE HCL 1 MG/ML IJ SOLN
1.0000 mg | Freq: Once | INTRAMUSCULAR | Status: AC
  Administered 2015-06-23: 1 mg via INTRAMUSCULAR
  Filled 2015-06-23: qty 1

## 2015-06-23 MED ORDER — PREDNISONE 50 MG PO TABS: 50.0000 mg | ORAL_TABLET | Freq: Every day | ORAL | Status: DC

## 2015-06-23 NOTE — ED Provider Notes (Signed)
History  This chart was scribed for non-physician practitioner, Jeannett Senior, PA-C,working with Daleen Bo, MD, by Marlowe Kays, ED Scribe. This patient was seen in room WTR5/WTR5 and the patient's care was started at 2:08 PM.  Chief Complaint  Patient presents with  . Back Pain   The history is provided by the patient and medical records. No language interpreter was used.    HPI Comments:  [name] is a 59 y.o. female with PMHx of DDD and arthritis who presents to the Emergency Department complaining of severe lower back pain that began approximately three weeks ago. She states she has a spinal cord stimulator and just had it checked to verify it was working. She states Dr. Carloyn Manner, her neurosurgeon, has ordered a CT scan with [facility] imaging but has not heard anything from them yet. She states the pain radiates down bilateral lower extremities, front and back. She reports using warm and ice compresses, taking Flexeril and Tramadol (last dose three hours ago) with no significant relief of the pain. She states she has been taking Dilaudid at night from a prescription left over from shoulder surgery 7 months ago. Trying to walk makes the pain worse. She denies alleviating factors. She denies fever, chills, nausea, vomiting, bruising, wounds, bladder or bowel changes, numbness or tingling of BLE. She reports having six previous surgeries on her back.  Past Medical History  Diagnosis Date  . Endometriosis 1980's    Dr. Warnell Forester  . Osteopenia   . Thyroid disease   . Hepatitis     genotype 1b hepatitis C virus, monitored by [facility] infectious disease group  . Hypertension   . History of hepatitis C   . GERD (gastroesophageal reflux disease)   . Arthritis   . DDD (degenerative disc disease), lumbar   . Diabetes mellitus 1-13    BORDERLINE DR. BALAN-diet controlled  . Spinal cord stimulator status     placed 2006   Past Surgical History  Procedure Laterality Date  . Breast surgery   1983    left breast cyst removed  . Cholecystectomy  ?2002  . Laparoscopic endometriosis fulguration  1980's    Dr. Warnell Forester  . Lumbar hemilaminectomy  01/1989    L5-S1 on right, Dr. Ardeen Jourdain  . Lumbar disc surgery  08/13/2003    Diskectomy/fusion L5-S1 rt., Dr. Maia Plan  . Spinal cord stimulator insertion  12/29/2004    leads placed along spine from T8 to T10, Dr. Maia Plan  . Spinal cord stimulator pulse generator  01/05/2005    placed in right upper buttocks, Dr. Maia Plan  . Anterior cervical decompression and fusion with a reflex hyper plate  17/03/1606    P7-T0 and C5-C6 , Dr. Maia Plan  . Cervical fusion w/oasis system infuse  05/13/2006    C4-C5 and C5-C6 and C6-7,  Dr. Maia Plan  . Nasal sinus surgery  10/30/2007    Left upper Sinus lift,  Dr. Mingo Amber  . Esophageal dilation      Dr. Amedeo Plenty  . Liver biopsy  2002  . Tonsillectomy    . Colonoscopy    . Shoulder arthroscopy with subacromial decompression Right 12/06/2014    Procedure: SHOULDER ARTHROSCOPY WITH SUBACROMIAL DECOMPRESSION; EXCISION BONE SPUR;  Surgeon: Roseanne Kaufman, MD;  Location: [place];  Service: Orthopedics;  Laterality: Right;  . Cholecystectomy  2002  . Shoulder surgery Right 12/06/14    "cleaned up bone spurs"   Family History  Problem Relation Age of Onset  .  Hypertension Father   . Cancer Father     colon, liver  . Hypertension Sister   . Hypertension Brother   . Cancer Brother     lung, adrenal glands and brain  . Hypertension Brother    Social History  Substance Use Topics  . Smoking status: Never Smoker   . Smokeless tobacco: None  . Alcohol Use: 0.0 oz/week    0 Standard drinks or equivalent per week     Comment: occ   OB History    Gravida Para Term Preterm AB TAB SAB Ectopic Multiple Living   1 1 0 0 0 0 0 0       Review of Systems  Constitutional: Negative for fever and chills.  Gastrointestinal: Negative for nausea and vomiting.  Genitourinary:       No bowel or bladder  incontinence  Musculoskeletal: Positive for back pain.  Skin: Negative for color change and wound.  Neurological: Negative for numbness.    Allergies  Duloxetine; Sumatriptan; Imitrex; Morphine and related; Pentazocine lactate; Acetic acid-oxyquinoline; Codeine phosphate; Morphine and related; and Oxycodone-acetaminophen  Home Medications   Prior to Admission medications   Medication Sig Start Date End Date Taking? Authorizing Provider  benazepril-hydrochlorthiazide (LOTENSIN HCT) 20-12.5 MG per tablet Take 1 tablet by mouth daily.    Historical Provider, MD  FLUoxetine (PROZAC) 20 MG capsule Take 20 mg by mouth daily.    Historical Provider, MD  fluticasone (FLONASE) 50 MCG/ACT nasal spray Place 2 sprays into the nose daily.    Historical Provider, MD  fluticasone (VERAMYST) 27.5 MCG/SPRAY nasal spray Place 1 spray into the nose as needed for rhinitis.    Historical Provider, MD  levothyroxine (SYNTHROID, LEVOTHROID) 137 MCG tablet Take 137 mcg by mouth daily before breakfast.    Historical Provider, MD  lisinopril-hydrochlorothiazide (PRINZIDE,ZESTORETIC) 20-12.5 MG per tablet Take 1 tablet by mouth daily.    Historical Provider, MD  meloxicam (MOBIC) 7.5 MG tablet Take 7.5 mg by mouth 2 (two) times daily.    Historical Provider, MD  omeprazole (PRILOSEC) 20 MG capsule Take 20 mg by mouth 2 (two) times daily before a meal.    Historical Provider, MD  polyethylene glycol (MIRALAX / GLYCOLAX) packet Take 17 g by mouth daily as needed.    Historical Provider, MD  rizatriptan (MAXALT) 10 MG tablet Take 10 mg by mouth as needed for migraine. May repeat in 2 hours if needed    Historical Provider, MD   Triage Vitals: BP 125/107 mmHg  Pulse 93  Temp(Src) 97.8 F (36.6 C) (Oral)  Resp 18  SpO2 99%  LMP 06/24/2012 Physical Exam  Constitutional: She is oriented to person, place, and time. She appears well-developed and well-nourished.  HENT:  Head: Normocephalic and atraumatic.  Eyes: EOM  are normal.  Neck: Normal range of motion.  Cardiovascular: Normal rate, regular rhythm and normal heart sounds.   Pulmonary/Chest: Effort normal and breath sounds normal. No respiratory distress. She has no wheezes. She has no rales.  Musculoskeletal: Normal range of motion.  No midline lumbar spine tenderness. Tender to palpation over bilateral paraspinal lumbar spine muscles. Bilateral SI joint tenderness. Pain with bilateral straight leg raise.  Neurological: She is alert and oriented to person, place, and time.  5/5 and equal lower extremity strength. 2+ and equal patellar reflexes bilaterally. Pt able to dorsiflex bilateral toes and feet with good strength against resistance. Equal sensation bilaterally over thighs and lower legs.   Skin: Skin is warm and dry.  Psychiatric: She has a normal mood and affect. Her behavior is normal.  Nursing note and vitals reviewed.   ED Course  Procedures (including critical care time) DIAGNOSTIC STUDIES: Oxygen Saturation is 99% on RA, normal by my interpretation.   COORDINATION OF CARE: 2:15 PM- Will order pain medication and speak with Dr. Eulis Foster about appropriate course of action. Pt verbalizes understanding and agrees to plan.  Medications - No data to display  Labs Review Labs Reviewed - No data to display  Imaging Review No results found. I have personally reviewed and evaluated these images and lab results as part of my medical decision-making.   EKG Interpretation None      MDM   Final diagnoses:  Bilateral low back pain with sciatica, sciatica laterality unspecified   Patient with chronic back pain, here with acute exacerbation.Patient with multiple prior back surgeries with a Stimulator, followed by Dr. Carloyn Manner with even neurosurgery, Complaining of worsening pain. No new numbness or weakness in extremity. No bladder or bowel issues. Doubt cauda equina.Patient's exam shows no concerning symptoms at this time.I discussed with Dr.  Eulis Foster, no imaging indicated on emergent basis. We'll try to get pain under control. Dilaudid 1 mg IM and Toradol 60 mg IM given in emergency department.  3:58 PM Pt ambulated up and down hallway. Feeling better. Will start on prednisone, pain medications - percocet at home. Pt instructed to call her neurosurgeon and follow up tomorrow.   Filed Vitals:   06/23/15 1314  BP: 125/107  Pulse: 93  Temp: 97.8 F (36.6 C)  TempSrc: Oral  Resp: 18  SpO2: 99%    I personally performed the services described in this documentation, which was scribed in my presence. The recorded information has been reviewed and is accurate.    Jeannett Senior, PA-C 06/23/15 1747  Jeannett Senior, PA-C 06/23/15 1747  Daleen Bo, MD 06/24/15 [phone]

## 2015-06-23 NOTE — Discharge Instructions (Signed)
Continue tramadol and Flexeril. Take Percocet as needed for severe pain. Prednisone as prescribed until gone for inflammation. Follow-up with your neurosurgeon. Return if worsening.   Back Pain, Adult Back pain is very common. The pain often gets better over time. The cause of back pain is usually not dangerous. Most people can learn to manage their back pain on their own.  HOME CARE   Stay active. Start with short walks on flat ground if you can. Try to walk farther each day.  Do not sit, drive, or stand in one place for more than 30 minutes. Do not stay in bed.  Do not avoid exercise or work. Activity can help your back heal faster.  Be careful when you bend or lift an object. Bend at your knees, keep the object close to you, and do not twist.  Sleep on a firm mattress. Lie on your side, and bend your knees. If you lie on your back, put a pillow under your knees.  Only take medicines as told by your doctor.  Put ice on the injured area.  Put ice in a plastic bag.  Place a towel between your skin and the bag.  Leave the ice on for 15-20 minutes, 03-04 times a day for the first 2 to 3 days. After that, you can switch between ice and heat packs.  Ask your doctor about back exercises or massage.  Avoid feeling anxious or stressed. Find good ways to deal with stress, such as exercise. GET HELP RIGHT AWAY IF:   Your pain does not go away with rest or medicine.  Your pain does not go away in 1 week.  You have new problems.  You do not feel well.  The pain spreads into your legs.  You cannot control when you poop (bowel movement) or pee (urinate).  Your arms or legs feel weak or lose feeling (numbness).  You feel sick to your stomach (nauseous) or throw up (vomit).  You have belly (abdominal) pain.  You feel like you may pass out (faint). MAKE SURE YOU:   Understand these instructions.  Will watch your condition.  Will get help right away if you are not doing well  or get worse. Document Released: 03/22/2008 Document Revised: 12/27/2011 Document Reviewed: 02/05/2014 [facility] Patient Information 2015 [place], [place]. This information is not intended to replace advice given to you by your health care provider. Make sure you discuss any questions you have with your health care provider.

## 2015-06-23 NOTE — ED Notes (Signed)
Pt with degenerative disk disease c/o back pain, states she is awaiting CT appointment but that the pain is too severe to wait. Pt denies loss of control of bowel or bladder.

## 2015-06-23 NOTE — ED Notes (Signed)
Pt is a&ox4, ambulatory, gait is steady. Questions, concerns denied r/t dc

## 2015-06-23 NOTE — ED Notes (Signed)
Pt ambulated with 1 assist from room to end of nursing station. She reports difficulty with ambulation, gait steady.

## 2015-06-25 ENCOUNTER — Other Ambulatory Visit: Payer: Self-pay | Admitting: Neurosurgery

## 2015-06-25 DIAGNOSIS — M47816 Spondylosis without myelopathy or radiculopathy, lumbar region: Secondary | ICD-10-CM

## 2015-06-26 ENCOUNTER — Ambulatory Visit
Admission: RE | Admit: 2015-06-26 | Discharge: 2015-06-26 | Disposition: A | Discharge status: Home / Self Care | Payer: Medicare Other | Source: Ambulatory Visit | Attending: Neurosurgery | Admitting: Neurosurgery

## 2015-06-26 DIAGNOSIS — M47816 Spondylosis without myelopathy or radiculopathy, lumbar region: Secondary | ICD-10-CM

## 2015-07-04 ENCOUNTER — Other Ambulatory Visit: Payer: Self-pay | Admitting: Neurosurgery

## 2015-07-04 DIAGNOSIS — M47816 Spondylosis without myelopathy or radiculopathy, lumbar region: Secondary | ICD-10-CM

## 2015-07-07 ENCOUNTER — Other Ambulatory Visit: Payer: Self-pay | Admitting: Neurosurgery

## 2015-07-07 DIAGNOSIS — M47816 Spondylosis without myelopathy or radiculopathy, lumbar region: Secondary | ICD-10-CM

## 2015-07-11 ENCOUNTER — Ambulatory Visit
Admission: RE | Admit: 2015-07-11 | Discharge: 2015-07-11 | Disposition: A | Discharge status: Home / Self Care | Payer: Medicare Other | Source: Ambulatory Visit | Attending: Neurosurgery | Admitting: Neurosurgery

## 2015-07-11 DIAGNOSIS — M47816 Spondylosis without myelopathy or radiculopathy, lumbar region: Secondary | ICD-10-CM

## 2015-07-11 MED ORDER — IOHEXOL 300 MG/ML  SOLN
10.0000 mL | Freq: Once | INTRAMUSCULAR | Status: DC | PRN
  Administered 2015-07-11: 10 mL via INTRATHECAL

## 2015-07-11 MED ORDER — DIAZEPAM 5 MG PO TABS
10.0000 mg | ORAL_TABLET | Freq: Once | ORAL | Status: AC
  Administered 2015-07-11: 10 mg via ORAL

## 2015-07-11 MED ORDER — ONDANSETRON HCL 4 MG/2ML IJ SOLN
4.0000 mg | Freq: Once | INTRAMUSCULAR | Status: AC
  Administered 2015-07-11: 4 mg via INTRAMUSCULAR

## 2015-07-11 MED ORDER — MEPERIDINE HCL 100 MG/ML IJ SOLN
75.0000 mg | Freq: Once | INTRAMUSCULAR | Status: AC
  Administered 2015-07-11: 75 mg via INTRAMUSCULAR

## 2015-07-11 NOTE — Discharge Instructions (Signed)

## 2015-07-14 ENCOUNTER — Other Ambulatory Visit: Payer: Medicare Other

## 2015-07-15 ENCOUNTER — Encounter: Payer: Medicare Other | Admitting: Women's Health

## 2015-07-15 ENCOUNTER — Other Ambulatory Visit: Payer: Self-pay | Admitting: Neurosurgery

## 2015-07-15 DIAGNOSIS — M47816 Spondylosis without myelopathy or radiculopathy, lumbar region: Secondary | ICD-10-CM

## 2015-07-17 ENCOUNTER — Ambulatory Visit: Payer: Medicare Other

## 2015-07-19 HISTORY — PX: OTHER SURGICAL HISTORY: SHX169

## 2015-07-21 ENCOUNTER — Ambulatory Visit
Admission: RE | Admit: 2015-07-21 | Discharge: 2015-07-21 | Disposition: A | Discharge status: Home / Self Care | Payer: Medicare Other | Source: Ambulatory Visit | Attending: Neurosurgery | Admitting: Neurosurgery

## 2015-07-21 DIAGNOSIS — M47816 Spondylosis without myelopathy or radiculopathy, lumbar region: Secondary | ICD-10-CM

## 2015-07-21 MED ORDER — METHYLPREDNISOLONE ACETATE 40 MG/ML INJ SUSP (RADIOLOG
120.0000 mg | Freq: Once | INTRAMUSCULAR | Status: AC
  Administered 2015-07-21: 120 mg via EPIDURAL

## 2015-07-21 MED ORDER — DIAZEPAM 5 MG PO TABS
10.0000 mg | ORAL_TABLET | Freq: Once | ORAL | Status: AC
  Administered 2015-07-21: 10 mg via ORAL

## 2015-07-21 MED ORDER — IOHEXOL 180 MG/ML  SOLN
1.0000 mL | Freq: Once | INTRAMUSCULAR | Status: DC | PRN
  Administered 2015-07-21: 1 mL via EPIDURAL

## 2015-07-21 NOTE — Discharge Instructions (Signed)

## 2015-09-15 ENCOUNTER — Encounter (HOSPITAL_COMMUNITY): Payer: Self-pay | Admitting: Emergency Medicine

## 2015-09-15 ENCOUNTER — Emergency Department (HOSPITAL_COMMUNITY)
Admission: EM | Admit: 2015-09-15 | Discharge: 2015-09-15 | Disposition: A | Discharge status: Home / Self Care | Payer: Medicare Other | Attending: Emergency Medicine | Admitting: Emergency Medicine

## 2015-09-15 DIAGNOSIS — Z7952 Long term (current) use of systemic steroids: Secondary | ICD-10-CM | POA: Diagnosis not present

## 2015-09-15 DIAGNOSIS — M1990 Unspecified osteoarthritis, unspecified site: Secondary | ICD-10-CM | POA: Insufficient documentation

## 2015-09-15 DIAGNOSIS — Z8619 Personal history of other infectious and parasitic diseases: Secondary | ICD-10-CM | POA: Diagnosis not present

## 2015-09-15 DIAGNOSIS — Z791 Long term (current) use of non-steroidal anti-inflammatories (NSAID): Secondary | ICD-10-CM | POA: Diagnosis not present

## 2015-09-15 DIAGNOSIS — Z8742 Personal history of other diseases of the female genital tract: Secondary | ICD-10-CM | POA: Insufficient documentation

## 2015-09-15 DIAGNOSIS — M5136 Other intervertebral disc degeneration, lumbar region: Secondary | ICD-10-CM | POA: Insufficient documentation

## 2015-09-15 DIAGNOSIS — I10 Essential (primary) hypertension: Secondary | ICD-10-CM | POA: Diagnosis not present

## 2015-09-15 DIAGNOSIS — K219 Gastro-esophageal reflux disease without esophagitis: Secondary | ICD-10-CM | POA: Insufficient documentation

## 2015-09-15 DIAGNOSIS — Z79899 Other long term (current) drug therapy: Secondary | ICD-10-CM | POA: Diagnosis not present

## 2015-09-15 DIAGNOSIS — E079 Disorder of thyroid, unspecified: Secondary | ICD-10-CM | POA: Insufficient documentation

## 2015-09-15 DIAGNOSIS — M5431 Sciatica, right side: Secondary | ICD-10-CM

## 2015-09-15 DIAGNOSIS — M5441 Lumbago with sciatica, right side: Secondary | ICD-10-CM | POA: Diagnosis not present

## 2015-09-15 DIAGNOSIS — M25561 Pain in right knee: Secondary | ICD-10-CM | POA: Insufficient documentation

## 2015-09-15 DIAGNOSIS — M549 Dorsalgia, unspecified: Secondary | ICD-10-CM | POA: Diagnosis present

## 2015-09-15 NOTE — Discharge Instructions (Signed)
Radicular Pain °Radicular pain in either the arm or leg is usually from a bulging or herniated disk in the spine. A piece of the herniated disk may press against the nerves as the nerves exit the spine. This causes pain which is felt at the tips of the nerves down the arm or leg. Other causes of radicular pain may include: °· Fractures. °· Heart disease. °· Cancer. °· An abnormal and usually degenerative state of the nervous system or nerves (neuropathy). °Diagnosis may require CT or MRI scanning to determine the primary cause.  °Nerves that start at the neck (nerve roots) may cause radicular pain in the outer shoulder and arm. It can spread down to the thumb and fingers. The symptoms vary depending on which nerve root has been affected. In most cases radicular pain improves with conservative treatment. Neck problems may require physical therapy, a neck collar, or cervical traction. Treatment may take many weeks, and surgery may be considered if the symptoms do not improve.  °Conservative treatment is also recommended for sciatica. Sciatica causes pain to radiate from the lower back or buttock area down the leg into the foot. Often there is a history of back problems. Most patients with sciatica are better after 2 to 4 weeks of rest and other supportive care. Short term bed rest can reduce the disk pressure considerably. Sitting, however, is not a good position since this increases the pressure on the disk. You should avoid bending, lifting, and all other activities which make the problem worse. Traction can be used in severe cases. Surgery is usually reserved for patients who do not improve within the first months of treatment. °Only take over-the-counter or prescription medicines for pain, discomfort, or fever as directed by your caregiver. Narcotics and muscle relaxants may help by relieving more severe pain and spasm and by providing mild sedation. Cold or massage can give significant relief. Spinal manipulation  is not recommended. It can increase the degree of disc protrusion. Epidural steroid injections are often effective treatment for radicular pain. These injections deliver medicine to the spinal nerve in the space between the protective covering of the spinal cord and back bones (vertebrae). Your caregiver can give you more information about steroid injections. These injections are most effective when given within two weeks of the onset of pain.  °You should see your caregiver for follow up care as recommended. A program for neck and back injury rehabilitation with stretching and strengthening exercises is an important part of management.  °SEEK IMMEDIATE MEDICAL CARE IF: °· You develop increased pain, weakness, or numbness in your arm or leg. °· You develop difficulty with bladder or bowel control. °· You develop abdominal pain. °  °This information is not intended to replace advice given to you by your health care provider. Make sure you discuss any questions you have with your health care provider. °  °Document Released: 11/11/2004 Document Revised: 10/25/2014 Document Reviewed: 04/30/2015 °Elsevier Interactive Patient Education ©2016 [employer] Inc. ° °

## 2015-09-15 NOTE — ED Provider Notes (Signed)
CSN: [id number]     Arrival date & time 09/15/15  1255 History  By signing my name below, I, Rayna Sexton, attest that this documentation has been prepared under the direction and in the presence of Glendell Docker, NP. Electronically Signed: Rayna Sexton, ED Scribe. 09/15/2015. 1:27 PM.   Chief Complaint  Patient presents with  . Back Pain   The history is provided by the patient. No language interpreter was used.    HPI Comments: [name] is a 59 y.o. female who presents to the Emergency Department complaining of constant, moderate, right knee pain with onset 2 months ago. Pt notes that the pain begins in her right knee and radiates up to her right hip. She notes a SHx to her back on 10/3 by Dr. Carloyn Manner in [place] noting that her pain has been present both before and after this operation. Pt was discharged with oxycodone 5-325 with acetaminophen s/p her operation and discontinued use due to it causing GI issues. She now takes tramadol and cyclobenzaprine for pain management which she says provides little relief of her knee and hip pain. She has a follow-up appointment with her surgeon on 12/6. Pt denies any other associated symptoms at this time.   Past Medical History  Diagnosis Date  . Endometriosis 1980's    Dr. Warnell Forester  . Osteopenia   . Thyroid disease   . Hepatitis     genotype 1b hepatitis C virus, monitored by [facility] infectious disease group  . Hypertension   . History of hepatitis C   . GERD (gastroesophageal reflux disease)   . Arthritis   . DDD (degenerative disc disease), lumbar   . Diabetes mellitus 1-13    BORDERLINE DR. BALAN-diet controlled  . Spinal cord stimulator status     placed 2006   Past Surgical History  Procedure Laterality Date  . Breast surgery  1983    left breast cyst removed  . Cholecystectomy  ?2002  . Laparoscopic endometriosis fulguration  1980's    Dr. Warnell Forester  . Lumbar hemilaminectomy  01/1989    L5-S1 on right, Dr. Ardeen Jourdain  . Lumbar disc  surgery  08/13/2003    Diskectomy/fusion L5-S1 rt., Dr. Maia Plan  . Spinal cord stimulator insertion  12/29/2004    leads placed along spine from T8 to T10, Dr. Maia Plan  . Spinal cord stimulator pulse generator  01/05/2005    placed in right upper buttocks, Dr. Maia Plan  . Anterior cervical decompression and fusion with a reflex hyper plate  [id number]    [id number] and C5-C6 , Dr. Maia Plan  . Cervical fusion w/oasis system infuse  05/13/2006    C4-C5 and C5-C6 and C6-7,  Dr. Maia Plan  . Nasal sinus surgery  10/30/2007    Left upper Sinus lift,  Dr. Mingo Amber  . Esophageal dilation      Dr. Amedeo Plenty  . Liver biopsy  2002  . Tonsillectomy    . Colonoscopy    . Shoulder arthroscopy with subacromial decompression Right 12/06/2014    Procedure: SHOULDER ARTHROSCOPY WITH SUBACROMIAL DECOMPRESSION; EXCISION BONE SPUR;  Surgeon: Roseanne Kaufman, MD;  Location: [place];  Service: Orthopedics;  Laterality: Right;  . Cholecystectomy  2002  . Shoulder surgery Right 12/06/14    "cleaned up bone spurs"   Family History  Problem Relation Age of Onset  . Hypertension Father   . Cancer Father     colon, liver  . Hypertension Sister   . Hypertension  Brother   . Cancer Brother     lung, adrenal glands and brain  . Hypertension Brother    Social History  Substance Use Topics  . Smoking status: Never Smoker   . Smokeless tobacco: None  . Alcohol Use: 0.0 oz/week    0 Standard drinks or equivalent per week     Comment: occ   OB History    Gravida Para Term Preterm AB TAB SAB Ectopic Multiple Living   1 1 0 0 0 0 0 0       Review of Systems A complete 10 system review of systems was obtained and all systems are negative except as noted in the HPI and PMH.  Allergies  Duloxetine; Acetic acid-oxyquinoline; Codeine phosphate; Imitrex; Morphine and related; and Pentazocine lactate  Home Medications   Prior to Admission medications   Medication Sig Start Date End Date Taking? Authorizing  Provider  benazepril-hydrochlorthiazide (LOTENSIN HCT) 20-12.5 MG per tablet Take 1 tablet by mouth daily.    Historical Provider, MD  fluconazole (DIFLUCAN) 150 MG tablet TAKE 1 TABLET BY MOUTH ONCE. 05/05/15   Historical Provider, MD  FLUoxetine (PROZAC) 20 MG capsule Take 20 mg by mouth daily.    Historical Provider, MD  fluticasone (FLONASE) 50 MCG/ACT nasal spray Place 2 sprays into the nose daily.    Historical Provider, MD  levothyroxine (SYNTHROID, LEVOTHROID) 137 MCG tablet Take 137 mcg by mouth daily before breakfast.    Historical Provider, MD  lisinopril-hydrochlorothiazide (PRINZIDE,ZESTORETIC) 20-12.5 MG per tablet Take 1 tablet by mouth daily.    Historical Provider, MD  meloxicam (MOBIC) 7.5 MG tablet Take 7.5 mg by mouth 2 (two) times daily.    Historical Provider, MD  omeprazole (PRILOSEC) 20 MG capsule Take 20 mg by mouth 2 (two) times daily before a meal.    Historical Provider, MD  oxyCODONE-acetaminophen (PERCOCET) 10-325 MG per tablet Take 1 tablet by mouth every 4 (four) hours as needed for pain.    Historical Provider, MD  polyethylene glycol (MIRALAX / GLYCOLAX) packet Take 17 g by mouth daily as needed.    Historical Provider, MD  rizatriptan (MAXALT) 10 MG tablet Take 10 mg by mouth as needed for migraine. May repeat in 2 hours if needed    Historical Provider, MD   BP 128/90 mmHg  Pulse 89  Temp(Src) 98 F (36.7 C) (Oral)  Resp 18  SpO2 99%  LMP 06/24/2012 Physical Exam  Constitutional: She is oriented to person, place, and time. She appears well-developed and well-nourished.  HENT:  Head: Normocephalic and atraumatic.  Mouth/Throat: No oropharyngeal exudate.  Neck: Normal range of motion. No tracheal deviation present.  Cardiovascular: Normal rate.   Pulmonary/Chest: Effort normal. No respiratory distress.  Abdominal: Soft. There is no tenderness.  Musculoskeletal: Normal range of motion.  Tender in there right lower back. Full rom. Good strength and  sensation to the area  Neurological: She is alert and oriented to person, place, and time.  Skin: Skin is warm and dry. She is not diaphoretic.  Psychiatric: She has a normal mood and affect. Her behavior is normal.  Nursing note and vitals reviewed.  ED Course  Procedures  DIAGNOSTIC STUDIES: Oxygen Saturation is 99% on RA, normal by my interpretation.    COORDINATION OF CARE: 1:26 PM Pt presents today due to right knee and hip pain. Informed pt of possible treatment options to discuss at next week's appointment with her surgeon as well as any return precautions. Pt agreed to plan.  Labs Review Labs Reviewed - No data to display  Imaging Review No results found.   EKG Interpretation None      MDM   Final diagnoses:  Sciatica of right side    Discussed with pt that she needs to see Dr. Carloyn Manner. Pt in agreement with plan  I personally performed the services described in this documentation, which was scribed in my presence. The recorded information has been reviewed and is accurate.     Glendell Docker, NP 09/15/15 [place], MD 09/17/15 [phone]

## 2015-09-15 NOTE — ED Notes (Signed)
Pt had back surgery on July 21, 2015 by Dr. Carloyn Manner. Pt sts that before and after the surgery she has had severe Right leg pain. Pain goes from R hip to R knee. Pt was on oxycodone but has run out. Pt taking tramadol and flexeril without relief. Pt has a post-op appointment scheduled on December 6 but sts she can't take the pain until then. A&Ox4 and ambulatory.

## 2015-11-04 ENCOUNTER — Ambulatory Visit
Admission: RE | Admit: 2015-11-04 | Discharge: 2015-11-04 | Disposition: A | Discharge status: Home / Self Care | Payer: Medicare Other | Source: Ambulatory Visit

## 2015-11-04 DIAGNOSIS — Z1231 Encounter for screening mammogram for malignant neoplasm of breast: Secondary | ICD-10-CM

## 2015-11-13 ENCOUNTER — Encounter: Payer: Medicare Other | Admitting: Women's Health

## 2015-11-19 ENCOUNTER — Ambulatory Visit (INDEPENDENT_AMBULATORY_CARE_PROVIDER_SITE_OTHER): Payer: Medicare Other | Admitting: Women's Health

## 2015-11-19 ENCOUNTER — Encounter: Payer: Self-pay | Admitting: Women's Health

## 2015-11-19 VITALS — BP 125/82 | Ht 67.0 in | Wt 180.6 lb

## 2015-11-19 DIAGNOSIS — M899 Disorder of bone, unspecified: Secondary | ICD-10-CM

## 2015-11-19 DIAGNOSIS — M8580 Other specified disorders of bone density and structure, unspecified site: Secondary | ICD-10-CM

## 2015-11-19 DIAGNOSIS — N952 Postmenopausal atrophic vaginitis: Secondary | ICD-10-CM

## 2015-11-19 DIAGNOSIS — Z01419 Encounter for gynecological examination (general) (routine) without abnormal findings: Secondary | ICD-10-CM | POA: Diagnosis not present

## 2015-11-19 NOTE — Progress Notes (Signed)
[name] [birth year] [id number]    History:    Presents for breast and pelvic exam. Postmenopausal on no HRT with no bleeding. 2015 treated for hepatitis C now Hep C negative. 02/2013 T score -1.1 FRAX 5.3%/0.3% repeated at medical doctor's office was stable with no change December 2016. Normal Pap and mammogram history. Primary care manages hypertension/hypercholesterolemia and depression. Has had problems with chronic back pain has a nerve stimulator implanted with last back surgery several months ago and has done well since with minimal back pain. Father died of colon cancer brother had precancerous colon polyps, 2015 negative colonoscopy.   Past medical history, past surgical history, family history and social history were all reviewed and documented in the EPIC chart. Has 2 granddaughters.  ROS:  A ROS was performed and pertinent positives and negatives are included.  Exam:  Filed Vitals:   11/19/15 1134  BP: 125/82    General appearance:  Normal Thyroid:  Symmetrical, normal in size, without palpable masses or nodularity. Respiratory  Auscultation:  Clear without wheezing or rhonchi Cardiovascular  Auscultation:  Regular rate, without rubs, murmurs or gallops  Edema/varicosities:  Not grossly evident Abdominal  Soft,nontender, without masses, guarding or rebound.  Liver/spleen:  No organomegaly noted  Hernia:  None appreciated  Skin  Inspection:  Grossly normal   Breasts: Examined lying and sitting.     Right: Without masses, retractions, discharge or axillary adenopathy.     Left: Without masses, retractions, discharge or axillary adenopathy. Gentitourinary   Inguinal/mons:  Normal without inguinal adenopathy  External genitalia:  Normal  BUS/Urethra/Skene's glands:  Normal  Vagina:  Atrophic  Cervix:  Normal stenotic os  Uterus:  normal in size, shape and contour.  Midline and mobile  Adnexa/parametria:     Rt: Without masses or tenderness.   Lt: Without masses  or tenderness.  Anus and perineum: Normal  Digital rectal exam: Normal sphincter tone without palpated masses or tenderness  Assessment/Plan:  60 y.o. MWF G1 P1 for breast and pelvic exam.  Postmenopausal/no HRT/no bleeding/vaginal atrophy 2016 mild osteopenia without elevated FRAX/stable Hypertension/hypercholesterolemia/depression-primary care manages labs and meds Chronic back pain better-nerves stimulator  Plan: Continue to increase regular exercise as able. SBE's, continue annual 3-D screening mammogram, calcium rich diet, vitamin D 1000 daily encouraged. Home safety, fall prevention and importance of weightbearing exercise reviewed. UA, Pap with HR HPV typing, new screening guidelines reviewed. Continue vaginal lubricants with intercourse    Huel Cote [facility], 1:09 PM 11/19/2015

## 2015-11-19 NOTE — Patient Instructions (Signed)

## 2015-12-02 ENCOUNTER — Ambulatory Visit
Admission: RE | Admit: 2015-12-02 | Discharge: 2015-12-02 | Disposition: A | Discharge status: Home / Self Care | Payer: Medicare Other | Source: Ambulatory Visit | Attending: Endocrinology | Admitting: Endocrinology

## 2015-12-02 DIAGNOSIS — E049 Nontoxic goiter, unspecified: Secondary | ICD-10-CM

## 2015-12-15 ENCOUNTER — Other Ambulatory Visit: Payer: Self-pay | Admitting: Endocrinology

## 2015-12-15 DIAGNOSIS — E049 Nontoxic goiter, unspecified: Secondary | ICD-10-CM

## 2015-12-23 ENCOUNTER — Encounter: Payer: Self-pay | Admitting: Women's Health

## 2015-12-23 ENCOUNTER — Emergency Department (HOSPITAL_COMMUNITY)
Admission: EM | Admit: 2015-12-23 | Discharge: 2015-12-24 | Disposition: A | Discharge status: Home / Self Care | Payer: Medicare Other | Attending: Emergency Medicine | Admitting: Emergency Medicine

## 2015-12-23 ENCOUNTER — Ambulatory Visit
Admission: RE | Admit: 2015-12-23 | Discharge: 2015-12-23 | Disposition: A | Discharge status: Home / Self Care | Payer: Medicare Other | Source: Ambulatory Visit | Attending: Endocrinology | Admitting: Endocrinology

## 2015-12-23 ENCOUNTER — Other Ambulatory Visit (HOSPITAL_COMMUNITY)
Admission: RE | Admit: 2015-12-23 | Discharge: 2015-12-23 | Disposition: A | Discharge status: Home / Self Care | Payer: Medicare Other | Source: Ambulatory Visit | Attending: Interventional Radiology | Admitting: Interventional Radiology

## 2015-12-23 ENCOUNTER — Encounter (HOSPITAL_COMMUNITY): Payer: Self-pay | Admitting: Emergency Medicine

## 2015-12-23 ENCOUNTER — Ambulatory Visit (INDEPENDENT_AMBULATORY_CARE_PROVIDER_SITE_OTHER): Payer: Medicare Other

## 2015-12-23 ENCOUNTER — Ambulatory Visit (INDEPENDENT_AMBULATORY_CARE_PROVIDER_SITE_OTHER): Payer: Medicare Other | Admitting: Women's Health

## 2015-12-23 VITALS — BP 132/86

## 2015-12-23 DIAGNOSIS — N83319 Acquired atrophy of ovary, unspecified side: Secondary | ICD-10-CM

## 2015-12-23 DIAGNOSIS — I10 Essential (primary) hypertension: Secondary | ICD-10-CM | POA: Insufficient documentation

## 2015-12-23 DIAGNOSIS — E079 Disorder of thyroid, unspecified: Secondary | ICD-10-CM | POA: Diagnosis not present

## 2015-12-23 DIAGNOSIS — N939 Abnormal uterine and vaginal bleeding, unspecified: Secondary | ICD-10-CM | POA: Diagnosis not present

## 2015-12-23 DIAGNOSIS — Z791 Long term (current) use of non-steroidal anti-inflammatories (NSAID): Secondary | ICD-10-CM | POA: Insufficient documentation

## 2015-12-23 DIAGNOSIS — K5900 Constipation, unspecified: Secondary | ICD-10-CM | POA: Insufficient documentation

## 2015-12-23 DIAGNOSIS — R1031 Right lower quadrant pain: Secondary | ICD-10-CM

## 2015-12-23 DIAGNOSIS — Z9049 Acquired absence of other specified parts of digestive tract: Secondary | ICD-10-CM | POA: Diagnosis not present

## 2015-12-23 DIAGNOSIS — M1990 Unspecified osteoarthritis, unspecified site: Secondary | ICD-10-CM | POA: Insufficient documentation

## 2015-12-23 DIAGNOSIS — Z79899 Other long term (current) drug therapy: Secondary | ICD-10-CM | POA: Diagnosis not present

## 2015-12-23 DIAGNOSIS — E049 Nontoxic goiter, unspecified: Secondary | ICD-10-CM

## 2015-12-23 DIAGNOSIS — K219 Gastro-esophageal reflux disease without esophagitis: Secondary | ICD-10-CM | POA: Insufficient documentation

## 2015-12-23 LAB — COMPREHENSIVE METABOLIC PANEL
ALK PHOS: 54 U/L (ref 38–126)
ALT: 24 U/L (ref 14–54)
ANION GAP: 12 (ref 5–15)
AST: 36 U/L (ref 15–41)
Albumin: 4.5 g/dL (ref 3.5–5.0)
BUN: 17 mg/dL (ref 6–20)
CALCIUM: 9.7 mg/dL (ref 8.9–10.3)
CO2: 27 mmol/L (ref 22–32)
Chloride: 100 mmol/L — ABNORMAL LOW (ref 101–111)
Creatinine, Ser: 1.01 mg/dL — ABNORMAL HIGH (ref 0.44–1.00)
GFR calc non Af Amer: 60 mL/min — ABNORMAL LOW (ref >60)
Glucose, Bld: 105 mg/dL — ABNORMAL HIGH (ref 65–99)
Potassium: 3.3 mmol/L — ABNORMAL LOW (ref 3.5–5.1)
SODIUM: 139 mmol/L (ref 135–145)
Total Bilirubin: 0.7 mg/dL (ref 0.3–1.2)
Total Protein: 7.5 g/dL (ref 6.5–8.1)

## 2015-12-23 LAB — URINALYSIS W MICROSCOPIC + REFLEX CULTURE
BILIRUBIN URINE: NEGATIVE
CASTS: NONE SEEN [LPF]
CRYSTALS: NONE SEEN [HPF]
Glucose, UA: NEGATIVE
KETONES UR: NEGATIVE
Leukocytes, UA: NEGATIVE
NITRITE: NEGATIVE
PROTEIN: NEGATIVE
YEAST: NONE SEEN [HPF]
pH: 5.5 (ref 5.0–8.0)

## 2015-12-23 LAB — CBC
HCT: 39.6 % (ref 36.0–46.0)
HEMOGLOBIN: 13 g/dL (ref 12.0–15.0)
MCH: 27.9 pg (ref 26.0–34.0)
MCHC: 32.8 g/dL (ref 30.0–36.0)
MCV: 85 fL (ref 78.0–100.0)
Platelets: 251 10*3/uL (ref 150–400)
RBC: 4.66 MIL/uL (ref 3.87–5.11)
RDW: 14.1 % (ref 11.5–15.5)
WBC: 7.6 10*3/uL (ref 4.0–10.5)

## 2015-12-23 LAB — LIPASE, BLOOD: LIPASE: 50 U/L (ref 11–51)

## 2015-12-23 MED ORDER — OXYCODONE-ACETAMINOPHEN 5-325 MG PO TABS
1.0000 | ORAL_TABLET | Freq: Once | ORAL | Status: AC
  Administered 2015-12-23: 1 via ORAL
  Filled 2015-12-23: qty 1

## 2015-12-23 MED ORDER — ONDANSETRON 4 MG PO TBDP
4.0000 mg | ORAL_TABLET | Freq: Once | ORAL | Status: AC
  Administered 2015-12-23: 4 mg via ORAL
  Filled 2015-12-23: qty 1

## 2015-12-23 NOTE — Progress Notes (Signed)
Patient ID: ZENIAH HARNACK, female   DOB: 11/13/1955, 60 y.o.   MRN: [id number] Presents with complaint of questionable vaginal bleeding that started this a.m., not related to intercourse postmenopausal greater than 10 years on no HRT. Had severe  lower right quadrant pain that was a 9 now a 7. Pain started abruptly at around noon, nothing precipitating. Denies nausea, constipation, pain or burning with urination, some frequency. Had a thyroid biopsy this a.m.  Exam: Appears uncomfortable. External genitalia bright red blood noted externally no visible tears, lesions. Speculum exam no blood in vaginal vault no irritation or erythema noted.  UA: +2 blood, 3-10 RBCs, 0-5 WBCs, few bacteria. Ultrasound: T/V and T/A anteverted uterus homogeneous. Endometrium 1.4 mm. Right ovary seen atrophic 1.1 cc with positive CFD within ovary. Left ovary atrophic positive CFD within the ovary. Negative CDS. No apparent masses in right or left adnexal.  Excruciating pain with ultrasound probe to right ovary, nausea,  Severe right lower quadrant pain  Plan: Toradol 60 mg IM right gluteus. Dr. Phineas Real in and reviewed films unlikely torsion, sent to ER for further evaluation to rule out kidney stone, appendicitis or GI issue.

## 2015-12-23 NOTE — ED Notes (Signed)
Pt was sent here from her PCP for further evaluation to r/o kidney stones, gi issue, or appendicitis. Pt has pain in her lower right abdomen and had vaginal bleeding this morning. A [place] was done at her PCP to r/o ovarian torsion. Pt was given toradol IM. Pt states her pain is 20/10.

## 2015-12-23 NOTE — ED Notes (Signed)
Pain medication given in Triage. Patient advised about side effects of medications and  to avoid driving for a minimum of 4 hours.  

## 2015-12-24 ENCOUNTER — Encounter (HOSPITAL_COMMUNITY): Payer: Self-pay

## 2015-12-24 ENCOUNTER — Emergency Department (HOSPITAL_COMMUNITY): Payer: Medicare Other

## 2015-12-24 LAB — URINALYSIS, ROUTINE W REFLEX MICROSCOPIC
Glucose, UA: NEGATIVE mg/dL
HGB URINE DIPSTICK: NEGATIVE
Ketones, ur: NEGATIVE mg/dL
Leukocytes, UA: NEGATIVE
NITRITE: NEGATIVE
PROTEIN: NEGATIVE mg/dL
SPECIFIC GRAVITY, URINE: 1.025 (ref 1.005–1.030)
pH: 5.5 (ref 5.0–8.0)

## 2015-12-24 MED ORDER — DICYCLOMINE HCL 20 MG PO TABS: 20.0000 mg | ORAL_TABLET | Freq: Two times a day (BID) | ORAL | Status: DC

## 2015-12-24 MED ORDER — POLYETHYLENE GLYCOL 3350 17 G PO PACK: 17.0000 g | PACK | Freq: Every day | ORAL | Status: DC | PRN

## 2015-12-24 MED ORDER — METOCLOPRAMIDE HCL 5 MG/ML IJ SOLN
10.0000 mg | INTRAMUSCULAR | Status: AC
  Administered 2015-12-24: 10 mg via INTRAVENOUS
  Filled 2015-12-24: qty 2

## 2015-12-24 MED ORDER — IOHEXOL 300 MG/ML  SOLN
100.0000 mL | Freq: Once | INTRAMUSCULAR | Status: AC | PRN
  Administered 2015-12-24: 100 mL via INTRAVENOUS

## 2015-12-24 MED ORDER — KETOROLAC TROMETHAMINE 60 MG/2ML IJ SOLN: 60.0000 mg | Freq: Once | INTRAMUSCULAR | Status: AC

## 2015-12-24 MED ORDER — IOHEXOL 300 MG/ML  SOLN
25.0000 mL | Freq: Once | INTRAMUSCULAR | Status: AC | PRN
  Administered 2015-12-24: 25 mL via ORAL

## 2015-12-24 MED ORDER — FENTANYL CITRATE (PF) 100 MCG/2ML IJ SOLN
50.0000 ug | Freq: Once | INTRAMUSCULAR | Status: AC
  Administered 2015-12-24: 50 ug via INTRAVENOUS
  Filled 2015-12-24: qty 2

## 2015-12-24 NOTE — ED Provider Notes (Signed)
CSN: [id number]     Arrival date & time 12/23/15  1605 History   First MD Initiated Contact with Patient 12/24/15 0055     Chief Complaint  Patient presents with  . Abdominal Pain     (Consider location/radiation/quality/duration/timing/severity/associated sxs/prior Treatment) HPI Comments: 60 year old female with a history of thyroid disease, hepatitis, hypertension, esophageal reflux, and diabetes mellitus presents to the emergency department for evaluation of acute onset of right lower quadrant abdominal pain. Patient states that pain began suddenly yesterday afternoon. Earlier in the day, she reports noticing some bleeding which she appreciated to be coming from her vagina. She went to her OB/GYN yesterday who performed an ultrasound of her pelvis which was negative for ovarian torsion. Her OB/GYN was also not concerned for any cancerous etiology. They recommended the patient be evaluated in the emergency department and given acute onset of her pain which was not relieved by Toradol given in the office. Patient states that she has not had anything to eat throughout the day. She complains of nausea without emesis. No bowel changes, dysuria, or hematuria. No fever. Abdominal surgical history notable for cholecystectomy, laparoscopic endometriosis fulguration, liver biopsy, and esophageal dilation.   The history is provided by the patient. No language interpreter was used.    Past Medical History  Diagnosis Date  . Thyroid disease   . Hepatitis     genotype 1b hepatitis C virus, monitored by [facility] infectious disease group  . Hypertension   . GERD (gastroesophageal reflux disease)   . Arthritis   . DDD (degenerative disc disease), lumbar   . Diabetes mellitus 1-13    BORDERLINE DR. BALAN-diet controlled  . Spinal cord stimulator status     placed 2006   Past Surgical History  Procedure Laterality Date  . Breast surgery  1983    left breast cyst removed  . Cholecystectomy  ?2002  .  Laparoscopic endometriosis fulguration  1980's    Dr. Warnell Forester  . Lumbar hemilaminectomy  01/1989    L5-S1 on right, Dr. Ardeen Jourdain  . Lumbar disc surgery  08/13/2003    Diskectomy/fusion L5-S1 rt., Dr. Maia Plan  . Spinal cord stimulator insertion  12/29/2004    leads placed along spine from T8 to T10, Dr. Maia Plan  . Spinal cord stimulator pulse generator  01/05/2005    placed in right upper buttocks, Dr. Maia Plan  . Anterior cervical decompression and fusion with a reflex hyper plate  [id number]    [id number] and C5-C6 , Dr. Maia Plan  . Cervical fusion w/oasis system infuse  05/13/2006    C4-C5 and C5-C6 and C6-7,  Dr. Maia Plan  . Nasal sinus surgery  10/30/2007    Left upper Sinus lift,  Dr. Mingo Amber  . Esophageal dilation      Dr. Amedeo Plenty  . Liver biopsy  2002  . Tonsillectomy    . Colonoscopy    . Shoulder arthroscopy with subacromial decompression Right 12/06/2014    Procedure: SHOULDER ARTHROSCOPY WITH SUBACROMIAL DECOMPRESSION; EXCISION BONE SPUR;  Surgeon: Roseanne Kaufman, MD;  Location: [facility];  Service: Orthopedics;  Laterality: Right;  . Cholecystectomy  2002  . Shoulder surgery Right 12/06/14    "cleaned up bone spurs"  . Shoulder surgery Right FEB 2016  . Lower back surgery  OCT 2016   Family History  Problem Relation Age of Onset  . Hypertension Father   . Cancer Father     colon, liver  . Hypertension Sister   .  Hypertension Brother   . Cancer Brother     lung, adrenal glands and brain  . Hypertension Brother    Social History  Substance Use Topics  . Smoking status: Never Smoker   . Smokeless tobacco: None  . Alcohol Use: 0.0 oz/week    0 Standard drinks or equivalent per week     Comment: occ   OB History    Gravida Para Term Preterm AB TAB SAB Ectopic Multiple Living   1 1 0 0 0 0 0 0  1      Review of Systems  Constitutional: Negative for fever.  Gastrointestinal: Positive for nausea and abdominal pain. Negative for vomiting and diarrhea.   Genitourinary: Positive for vaginal bleeding. Negative for dysuria.  All other systems reviewed and are negative.   Allergies  Duloxetine; Acetic acid-oxyquinoline; Codeine phosphate; Imitrex; Morphine and related; and Pentazocine lactate  Home Medications   Prior to Admission medications   Medication Sig Start Date End Date Taking? Authorizing Provider  FLUoxetine (PROZAC) 20 MG capsule Take 40 mg by mouth daily.    Yes Historical Provider, MD  fluticasone (FLONASE) 50 MCG/ACT nasal spray Place 2 sprays into the nose daily as needed for allergies.    Yes Historical Provider, MD  levothyroxine (SYNTHROID, LEVOTHROID) 125 MCG tablet Take 125 mcg by mouth daily. 12/12/15  Yes Historical Provider, MD  lisinopril-hydrochlorothiazide (PRINZIDE,ZESTORETIC) 20-12.5 MG per tablet Take 1 tablet by mouth daily.   Yes Historical Provider, MD  meloxicam (MOBIC) 7.5 MG tablet Take 7.5 mg by mouth 2 (two) times daily.   Yes Historical Provider, MD  omeprazole (PRILOSEC) 20 MG capsule Take 20 mg by mouth 2 (two) times daily before a meal.   Yes Historical Provider, MD  oxyCODONE-acetaminophen (PERCOCET) 10-325 MG per tablet Take 1 tablet by mouth every 4 (four) hours as needed for pain. Reported on 12/23/2015   Yes Historical Provider, MD  pravastatin (PRAVACHOL) 20 MG tablet Take 20 mg by mouth daily.   Yes Historical Provider, MD  rizatriptan (MAXALT) 10 MG tablet Take 10 mg by mouth as needed for migraine. May repeat in 2 hours if needed   Yes Historical Provider, MD  traZODone (DESYREL) 50 MG tablet Take 50 mg by mouth at bedtime. 11/18/15  Yes Historical Provider, MD  dicyclomine (BENTYL) 20 MG tablet Take 1 tablet (20 mg total) by mouth 2 (two) times daily. 12/24/15   Antonietta Breach, PA-C  polyethylene glycol (MIRALAX / GLYCOLAX) packet Take 17 g by mouth daily as needed for mild constipation or moderate constipation. 12/24/15   Antonietta Breach, PA-C   BP 113/76 mmHg  Pulse 59  Temp(Src) 97.8 F (36.6 C) (Oral)   Resp 16  Ht 5\' 7"  (1.702 m)  Wt 80.74 kg  BMI 27.87 kg/m2  SpO2 94%  LMP 06/24/2012   Physical Exam  Constitutional: She is oriented to person, place, and time. She appears well-developed and well-nourished. No distress.  Nontoxic/nonseptic appearing  HENT:  Head: Normocephalic and atraumatic.  Eyes: Conjunctivae and EOM are normal. No scleral icterus.  Neck: Normal range of motion.  Cardiovascular: Normal rate, regular rhythm and intact distal pulses.   Pulmonary/Chest: Effort normal. No respiratory distress. She has no wheezes.  Respirations even and unlabored  Abdominal: Soft. She exhibits no distension. There is tenderness. There is no rebound and no guarding.  Soft abdomen with focal tenderness to palpation in the right lower quadrant. No masses or peritoneal signs.   Musculoskeletal: Normal range of motion.  Neurological: She is alert and oriented to person, place, and time. She exhibits normal muscle tone. Coordination normal.  Skin: Skin is warm and dry. No rash noted. She is not diaphoretic. No erythema. No pallor.  Psychiatric: She has a normal mood and affect. Her behavior is normal.  Nursing note and vitals reviewed.   ED Course  Procedures (including critical care time) Labs Review Labs Reviewed  COMPREHENSIVE METABOLIC PANEL - Abnormal; Notable for the following:    Potassium 3.3 (*)    Chloride 100 (*)    Glucose, Bld 105 (*)    Creatinine, Ser 1.01 (*)    GFR calc non Af Amer 60 (*)    All other components within normal limits  URINALYSIS, ROUTINE W REFLEX MICROSCOPIC (NOT AT [facility]) - Abnormal; Notable for the following:    Color, Urine AMBER (*)    Bilirubin Urine SMALL (*)    All other components within normal limits  LIPASE, BLOOD  CBC  CBG MONITORING, ED    Imaging Review US Transvaginal Non-ob  12/23/2015  T/V and T/A anteverted uterus homogeneous, endometrium 1.4 mm. Right ovary seen atrophic 1.1cc with positive CFD within ovary. Left ovary  atrophic positive CFD within ovary. Negative CDS. No apparent masses seen in right or left adnexal.  Ct Abdomen Pelvis W Contrast  12/24/2015  CLINICAL DATA:  Right lower quadrant pain and vaginal bleeding. EXAM: CT ABDOMEN AND PELVIS WITH CONTRAST TECHNIQUE: Multidetector CT imaging of the abdomen and pelvis was performed using the standard protocol following bolus administration of intravenous contrast. CONTRAST:  162mL OMNIPAQUE IOHEXOL 300 MG/ML SOLN, 25mL OMNIPAQUE IOHEXOL 300 MG/ML SOLN COMPARISON:  Pelvic ultrasound 1 day prior.  Remote CT 09/16/2005 FINDINGS: Lower chest: Minimal dependent atelectasis. Heart normal in size. Spinal stimulator, tips extending in the thorax beyond the field of view. Liver: Subcentimeter hypodense lesion within segment 6, too small to characterize, likely cyst or hemangioma. This is unchanged from prior. Hepatobiliary: Postcholecystectomy. There is intra and extrahepatic biliary ductal dilatation. Common bile duct measures 14 mm at the porta hepatis. No calcified choledocholithiasis. Pancreas: No ductal dilatation or inflammation. Spleen: Normal. Adrenal glands: No nodule. Kidneys: Symmetric renal enhancement and excretion. No hydronephrosis. Simple cyst in the right kidney measures 1.9 cm. Probable nonobstructing stone in the interpolar left kidney. Stomach/Bowel: Stomach physiologically distended. There are no dilated or thickened small bowel loops. Moderate stool burden in the right colon. Dependent soft tissue density in the cecum is felt to represent liquid stool rather than colonic wall thickening. No obstruction, enteric contrast just reaches the cecum. The appendix is normal. Vascular/Lymphatic: No retroperitoneal adenopathy. Abdominal aorta is normal in caliber. Mild atherosclerosis of the abdominal aorta without aneurysm. Reproductive: Uterus and ovaries normal for age.  No adnexal mass. Bladder: Minimally distended, no evident wall thickening. Other: No free air,  free fluid, or intra-abdominal fluid collection. Musculoskeletal: There are no acute or suspicious osseous abnormalities. Spinal stimulator in place, tips not included in the field of view. Postsurgical change at L5-S1. IMPRESSION: 1. Normal appendix. 2. Dependent soft tissue density in the cecum is felt to represent liquid stool rather than wall thickening. Moderate stool burden in the right colon. 3. Postcholecystectomy. There is intra and extrahepatic biliary ductal dilatation, a common finding postcholecystectomy. Electronically Signed   By: Jeb Levering M.D.   On: 12/24/2015 03:32   US Thyroid Biopsy  12/23/2015  INDICATION: 60 year old female on long-term thyroid hormone replacement with a small but enlarging hypoechoic nodule in the right gland.  EXAM: ULTRASOUND GUIDED NEEDLE ASPIRATE BIOPSY OF THE THYROID GLAND COMPARISON:  Prior thyroid ultrasound 12/02/2015 MEDICATIONS: 1% lidocaine COMPLICATIONS: None immediate. PROCEDURE: Informed written consent was obtained from the patient after a thorough discussion of the procedural risks, benefits and alternatives. All questions were addressed. Maximal Sterile Barrier Technique was utilized including caps, mask, sterile gowns, sterile gloves, sterile drape, hand hygiene and skin antiseptic. A timeout was performed prior to the initiation of the procedure. Ultrasound was performed to localize and mark an adequate site for the biopsy. The patient was then prepped and draped in a normal sterile fashion. Local anesthesia was provided with 1% lidocaine. Using direct ultrasound guidance, 5 passes were made using needles into the nodule within the right lobe of the thyroid. Two of the passes were reserve it for Afirma testing. Ultrasound was used to confirm needle placements on all occasions. Specimens were sent to Pathology for analysis. IMPRESSION: Ultrasound guided needle aspirate biopsy performed of the right thyroid nodule. Electronically Signed   By: Jacqulynn Cadet M.D.   On: 12/23/2015 16:20   I have personally reviewed and evaluated these images and lab results as part of my medical decision-making.   EKG Interpretation None      MDM   Final diagnoses:  Constipation, unspecified constipation type    60 year old female presents to the emergency department as advised by her OB/GYN for further evaluation of right lower quadrant abdominal pain which began suddenly yesterday. She had a negative pelvic ultrasound by her OB/GYN prior to arrival. Patient afebrile and hemodynamically stable. Laboratory workup is noncontributory. CT ordered for further evaluation of abdominal pain given focal tenderness in the right lower quadrant; no peritoneal signs on exam. CT shows evidence of moderate stool burden in the right colon. Suspect that pain is secondary to constipation. Will manage with Bentyl and MiraLAX. Patient discharged with instruction for primary care follow-up. Return precautions given at discharge. Patient agreeable to plan with no unaddressed concerns. Patient discharged in good condition.   Filed Vitals:   12/23/15 1627 12/23/15 2113 12/24/15 0119 12/24/15 0353  BP: 154/99 147/75 134/81 113/76  Pulse: 71 60 60 59  Temp: 97.8 F (36.6 C)  97.8 F (36.6 C)   TempSrc: Oral  Oral   Resp: 16 16 18 16   Height: 5\' 7"  (1.702 m)     Weight: 80.74 kg     SpO2: 100% 96% 98% 94%     Antonietta Breach, PA-C 12/24/15 0400  Orpah Greek, MD 12/24/15 [phone]

## 2015-12-24 NOTE — ED Notes (Signed)
Patient d/c'd self care.  F/U and medications discussed.  Patient verbalized understanding. 

## 2015-12-24 NOTE — Discharge Instructions (Signed)
Constipation, Adult °Constipation is when a person has fewer than three bowel movements a week, has difficulty having a bowel movement, or has stools that are dry, hard, or larger than normal. As people grow older, constipation is more common. A low-fiber diet, not taking in enough fluids, and taking certain medicines may make constipation worse.  °CAUSES  °· Certain medicines, such as antidepressants, pain medicine, iron supplements, antacids, and water pills.   °· Certain diseases, such as diabetes, irritable bowel syndrome (IBS), thyroid disease, or depression.   °· Not drinking enough water.   °· Not eating enough fiber-rich foods.   °· Stress or travel.   °· Lack of physical activity or exercise.   °· Ignoring the urge to have a bowel movement.   °· Using laxatives too much.   °SIGNS AND SYMPTOMS  °· Having fewer than three bowel movements a week.   °· Straining to have a bowel movement.   °· Having stools that are hard, dry, or larger than normal.   °· Feeling full or bloated.   °· Pain in the lower abdomen.   °· Not feeling relief after having a bowel movement.   °DIAGNOSIS  °Your health care provider will take a medical history and perform a physical exam. Further testing may be done for severe constipation. Some tests may include: °· A barium enema X-ray to examine your rectum, colon, and, sometimes, your small intestine.   °· A sigmoidoscopy to examine your lower colon.   °· A colonoscopy to examine your entire colon. °TREATMENT  °Treatment will depend on the severity of your constipation and what is causing it. Some dietary treatments include drinking more fluids and eating more fiber-rich foods. Lifestyle treatments may include regular exercise. If these diet and lifestyle recommendations do not help, your health care provider may recommend taking over-the-counter laxative medicines to help you have bowel movements. Prescription medicines may be prescribed if over-the-counter medicines do not work.    °HOME CARE INSTRUCTIONS  °· Eat foods that have a lot of fiber, such as fruits, vegetables, whole grains, and beans. °· Limit foods high in fat and processed sugars, such as french fries, hamburgers, cookies, candies, and soda.   °· A fiber supplement may be added to your diet if you cannot get enough fiber from foods.   °· Drink enough fluids to keep your urine clear or pale yellow.   °· Exercise regularly or as directed by your health care provider.   °· Go to the restroom when you have the urge to go. Do not hold it.   °· Only take over-the-counter or prescription medicines as directed by your health care provider. Do not take other medicines for constipation without talking to your health care provider first.   °SEEK IMMEDIATE MEDICAL CARE IF:  °· You have bright red blood in your stool.   °· Your constipation lasts for more than 4 days or gets worse.   °· You have abdominal or rectal pain.   °· You have thin, pencil-like stools.   °· You have unexplained weight loss. °MAKE SURE YOU:  °· Understand these instructions. °· Will watch your condition. °· Will get help right away if you are not doing well or get worse. °  °This information is not intended to replace advice given to you by your health care provider. Make sure you discuss any questions you have with your health care provider. °  °Document Released: 07/02/2004 Document Revised: 10/25/2014 Document Reviewed: 07/16/2013 °Elsevier Interactive Patient Education ©2016 [employer] ° °High-Fiber Diet °Fiber, also called dietary fiber, is a type of carbohydrate found in fruits, vegetables, whole grains, and   beans. A high-fiber diet can have many health benefits. Your health care provider may recommend a high-fiber diet to help: °· Prevent constipation. Fiber can make your bowel movements more regular. °· Lower your cholesterol. °· Relieve hemorrhoids, uncomplicated diverticulosis, or irritable bowel syndrome. °· Prevent overeating as part of a weight-loss  plan. °· Prevent heart disease, type 2 diabetes, and certain cancers. °WHAT IS MY PLAN? °The recommended daily intake of fiber includes: °· 38 grams for men under age 50. °· 30 grams for men over age 50. °· 25 grams for women under age 50. °· 21 grams for women over age 50. °You can get the recommended daily intake of dietary fiber by eating a variety of fruits, vegetables, grains, and beans. Your health care provider may also recommend a fiber supplement if it is not possible to get enough fiber through your diet. °WHAT DO I NEED TO KNOW ABOUT A HIGH-FIBER DIET? °· Fiber supplements have not been widely studied for their effectiveness, so it is better to get fiber through food sources. °· Always check the fiber content on the nutrition facts label of any prepackaged food. Look for foods that contain at least 5 grams of fiber per serving. °· Ask your dietitian if you have questions about specific foods that are related to your condition, especially if those foods are not listed in the following section. °· Increase your daily fiber consumption gradually. Increasing your intake of dietary fiber too quickly may cause bloating, cramping, or gas. °· Drink plenty of water. Water helps you to digest fiber. °WHAT FOODS CAN I EAT? °Grains °Whole-grain breads. Multigrain cereal. Oats and oatmeal. Brown rice. Barley. Bulgur wheat. Millet. Bran muffins. Popcorn. Rye wafer crackers. °Vegetables °Sweet potatoes. Spinach. Kale. Artichokes. Cabbage. Broccoli. Green peas. Carrots. Squash. °Fruits °Berries. Pears. Apples. Oranges. Avocados. Prunes and raisins. Dried figs. °Meats and Other Protein Sources °Navy, kidney, pinto, and soy beans. Split peas. Lentils. Nuts and seeds. °Dairy °Fiber-fortified yogurt. °Beverages °Fiber-fortified soy milk. Fiber-fortified orange juice. °Other °Fiber bars. °The items listed above may not be a complete list of recommended foods or beverages. Contact your dietitian for more options. °WHAT FOODS  ARE NOT RECOMMENDED? °Grains °White bread. Pasta made with refined flour. White rice. °Vegetables °Fried potatoes. Canned vegetables. Well-cooked vegetables.  °Fruits °Fruit juice. Cooked, strained fruit. °Meats and Other Protein Sources °Fatty cuts of meat. Fried poultry or fried fish. °Dairy °Milk. Yogurt. Cream cheese. Sour cream. °Beverages °Soft drinks. °Other °Cakes and pastries. Butter and oils. °The items listed above may not be a complete list of foods and beverages to avoid. Contact your dietitian for more information. °WHAT ARE SOME TIPS FOR INCLUDING HIGH-FIBER FOODS IN MY DIET? °· Eat a wide variety of high-fiber foods. °· Make sure that half of all grains consumed each day are whole grains. °· Replace breads and cereals made from refined flour or white flour with whole-grain breads and cereals. °· Replace white rice with brown rice, bulgur wheat, or millet. °· Start the day with a breakfast that is high in fiber, such as a cereal that contains at least 5 grams of fiber per serving. °· Use beans in place of meat in soups, salads, or pasta. °· Eat high-fiber snacks, such as berries, raw vegetables, nuts, or popcorn. °  °This information is not intended to replace advice given to you by your health care provider. Make sure you discuss any questions you have with your health care provider. °  °Document Released: 10/04/2005 Document Revised: 10/25/2014 Document   Reviewed: 03/19/2014 °Elsevier Interactive Patient Education ©2016 Elsevier Inc. ° °

## 2015-12-24 NOTE — Addendum Note (Signed)
Addended by: Thurnell Garbe A on: 12/24/2015 08:42 AM   Modules accepted: Orders

## 2015-12-25 ENCOUNTER — Other Ambulatory Visit: Payer: Self-pay | Admitting: Women's Health

## 2015-12-25 DIAGNOSIS — R3129 Other microscopic hematuria: Secondary | ICD-10-CM

## 2015-12-25 LAB — URINE CULTURE: Colony Count: 3000

## 2016-01-02 ENCOUNTER — Encounter (HOSPITAL_COMMUNITY): Payer: Self-pay

## 2016-01-12 ENCOUNTER — Encounter: Payer: Self-pay | Admitting: Women's Health

## 2016-01-12 ENCOUNTER — Ambulatory Visit (INDEPENDENT_AMBULATORY_CARE_PROVIDER_SITE_OTHER): Payer: Medicare Other | Admitting: Women's Health

## 2016-01-12 VITALS — BP 124/80 | Ht 67.0 in | Wt 180.0 lb

## 2016-01-12 DIAGNOSIS — R350 Frequency of micturition: Secondary | ICD-10-CM

## 2016-01-12 DIAGNOSIS — B3731 Acute candidiasis of vulva and vagina: Secondary | ICD-10-CM

## 2016-01-12 DIAGNOSIS — B373 Candidiasis of vulva and vagina: Secondary | ICD-10-CM

## 2016-01-12 LAB — URINALYSIS W MICROSCOPIC + REFLEX CULTURE
BILIRUBIN URINE: NEGATIVE
Crystals: NONE SEEN [HPF]
GLUCOSE, UA: NEGATIVE
Hgb urine dipstick: NEGATIVE
KETONES UR: NEGATIVE
LEUKOCYTES UA: NEGATIVE
NITRITE: NEGATIVE
PH: 6 (ref 5.0–8.0)
Protein, ur: NEGATIVE
SPECIFIC GRAVITY, URINE: 1.015 (ref 1.001–1.035)
Yeast: NONE SEEN [HPF]

## 2016-01-12 LAB — WET PREP FOR TRICH, YEAST, CLUE
Clue Cells Wet Prep HPF POC: NONE SEEN
Trich, Wet Prep: NONE SEEN

## 2016-01-12 MED ORDER — FLUCONAZOLE 150 MG PO TABS: 150.0000 mg | ORAL_TABLET | Freq: Once | ORAL | Status: DC

## 2016-01-12 NOTE — Addendum Note (Signed)
Addended by: Burnett Kanaris on: 01/12/2016 03:56 PM   Modules accepted: Orders

## 2016-01-12 NOTE — Progress Notes (Signed)
Patient ID: [name], female   DOB: November 25, 1955, 60 y.o.   MRN: [id number] Presents with complaint of unusual urine odor- a change, left-sided low back ache, above the site of a nerve stimulator for chronic back pain. Denies pain or burning with urination, vaginal irritation, discharge or fever. Postmenopausal/no bleeding/no HRT.  Exam: Appears well. No CVAT, abdomen soft without rebound or tenderness, external genitalia within normal limits, speculum exam scant discharge atrophic, wet prep positive for few yeast. No odor noted with exam. UA: Negative, 0-5 WBCs, 0 - 2 RBCs, few bacteria  Yeast vaginitis Strong urine odor  Plan: Diflucan 150 by mouth times one dose with refill. Urine culture pending. Increase water intake.

## 2016-01-12 NOTE — Patient Instructions (Signed)

## 2016-01-14 LAB — URINE CULTURE
COLONY COUNT: NO GROWTH
Organism ID, Bacteria: NO GROWTH

## 2016-02-06 ENCOUNTER — Ambulatory Visit: Payer: Self-pay | Admitting: Otolaryngology

## 2016-02-06 NOTE — H&P (Signed)
Progress Notes Encounter Date: 01/23/2016 Beckie Salts, MD  Otolaryngology  Expand All Collapse All   [] Hide copied text Otolaryngology Office Note  HPI:   [name] is a 60 y.o. female who presents as a consult patient with a chief complaint of Thyroid nodule. She has had a thyroid nodule on the right side for years but recently got larger, up to 13 mm. FNA was performed and Afirma testing was suspicious for carcinoma. She has a long history of hypothyroidism. She has had multiple back and neck surgeries.  PMH/Meds/All/SocHx/FamHx/ROS:    Past Medical History       Past Medical History  Diagnosis Date  . Acid reflux   . Hyperlipidemia   . Hypertension   . Hypothyroidism   . Migraine        Past Surgical History         Past Surgical History  Procedure Laterality Date  . Wisdom tooth extraction    . Breast biopsy    . Cholecystectomy    . Tubal ligation    . Shoulder surgery    . Neck surgery    . Back surgery    . Sinus surgery      left upper sinus lift      No family history of bleeding disorders, wound healing problems or difficulty with anesthesia.    Social History   Social History        Social History  . Marital status: Married    Spouse name: N/A  . Number of children: N/A  . Years of education: N/A      Occupational History  . Not on file.          Social History Main Topics  . Smoking status: Former Smoker    Quit date: 1992  . Smokeless tobacco: Never Used  . Alcohol use Yes      Comment: social  . Drug use: Not on file  . Sexual activity: Not on file       Other Topics Concern  . Not on file      Social History Narrative  . No narrative on file       Current Outpatient Prescriptions:  . FLUoxetine (PROZAC) 40 MG capsule, , Disp: , Rfl:  . fluticasone (FLONASE) 50 mcg/actuation nasal spray, 2 sprays by Nasal route., Disp: , Rfl:  . levothyroxine (SYNTHROID, LEVOTHROID) 125  MCG tablet, , Disp: , Rfl:  . lisinopril-hydrochlorothiazide (PRINZIDE,ZESTORETIC) 20-12.5 mg per tablet, , Disp: , Rfl:  . meloxicam (MOBIC) 7.5 MG tablet, , Disp: , Rfl:  . omeprazole (PRILOSEC) 20 MG capsule, Take by mouth., Disp: , Rfl:  . polyethylene glycol (MIRALAX) 17 gram packet, Take by mouth., Disp: , Rfl:  . pravastatin (PRAVACHOL) 20 MG tablet, , Disp: , Rfl:  . rizatriptan (MAXALT) 10 MG tablet, TAKE 1 TABLET ONCE DAILY AS NEEDED, MAY REPEAT IN 2 HOURS, MAX 2 IN 24 HOURS AS NEEDED FOR HEADACHE, Disp: , Rfl: 5 . traZODone (DESYREL) 50 MG tablet, , Disp: , Rfl:  . levothyroxine (SYNTHROID, LEVOTHROID) 137 MCG tablet, TAKE 1 TABLET BY MOUTH ONCE A DAY, Disp: , Rfl: 6  A complete ROS was performed with pertinent positives/negatives noted in the HPI. The remainder of the ROS are negative.   Physical Exam:        Visit Vitals  . BP 120/80  . Ht 1.702 m (5\' 7" )  . Wt 80.7 kg (178 lb)  . BMI 27.88 kg/m2  General: Healthy and alert, in no distress, breathing easily. Normal affect. In a pleasant mood. Head: Normocephalic, atraumatic. No masses, or scars. Eyes: Pupils are equal, and reactive to light. Vision is grossly intact. No spontaneous or gaze nystagmus. Ears: Ear canals are clear. Tympanic membranes are intact, with normal landmarks and the middle ears are clear and healthy. Hearing: Grossly normal. Nose. Nasal cavities are clear with healthy mucosa, no polyps or exudate.Airways are patent. Face: No masses or scars, facial nerve function is symmetric. Oral Cavity: No mucosal abnormalities are noted. Tongue with normal mobility. Dentition appears healthy. Oropharynx: Tonsils are symmetric. There are no mucosal masses identified. Tongue base appears normal and healthy. Larynx/Hypopharynx: indirect exam reveals healthy, mobile vocal cords, without mucosal lesions in the hypopharynx or larynx. Neck: No palpable masses, no cervical adenopathy, Very small nodule  barely palpable right anterior thyroid. Otherwise no palpable neck masses. Neuro: Cranial nerves II-XII will normal function. Balance: Normal gate. Other findings: none.   Independent Review of Additional Tests or Records:  none  Procedures:  none   Impression & Plans:  [name] is a 60 y.o. female with 13 mm right sided thyroid nodule, suspicious for cancer on Afirma testing. We discussed that there is a high probability that this is a very small differentiated cancer. We discussed the possibility that this may never caused her any problems and perhaps has always been present. We discussed that it is impossible to predict if this will become a problematic cancer. Options were discussed including observation with serial ultrasounds to see if there is any increase in size over time, or thyroid surgery. She is anxious about this and is very interested in surgery. Risks and benefits were discussed including the risk to the recurrent nerve, and hypocalcemia. She understands and agrees. All questions were answered. Handouts were provided.

## 2016-02-10 NOTE — Pre-Procedure Instructions (Signed)
    [name]  02/10/2016      [facility] [address], [place] [address] Phone: [phone] Fax: [phone]  [facility] [address] [place], [place] [address] [address] Phone: [phone] Fax: [phone]    Your procedure is scheduled on Friday, April 28th, 2017.  Report to [facility] Admitting at 7:30 A.M.   Call this number if you have problems the morning of surgery:  [phone]   Remember:  Do not eat food or drink liquids after midnight the night prior to surgery.   Take these medicines the morning of surgery with A SIP OF WATER: Fluoxetine (Prozac), Fluticasone (Flonase) if needed, Levothyroxine (Synthroid), Omeprazole (Prilosec), eye drops if needed.   Stop taking: Meloxicam (Mobic), Aspirin, NSAIDS, Aleve, Naproxen, Ibuprofen, Advil, Motrin, BC's, Goody's, Fish oil, all herbal medications, and all vitamins.    Do not wear jewelry, make-up or nail polish.  Do not wear lotions, powders, or perfumes.    Do not shave 48 hours prior to surgery.    Do not bring valuables to the hospital.   [facility] is not responsible for any belongings or valuables.  Contacts, dentures or bridgework may not be worn into surgery.  Leave your suitcase in the car.  After surgery it may be brought to your room.  For patients admitted to the hospital, discharge time will be determined by your treatment team.  Patients discharged the day of surgery will not be allowed to drive home.   Special instructions:  See attached.   Please read over the following fact sheets that you were given. Pain Booklet, Coughing and Deep Breathing and Surgical Site Infection Prevention

## 2016-02-11 ENCOUNTER — Encounter (HOSPITAL_COMMUNITY)
Admission: RE | Admit: 2016-02-11 | Discharge: 2016-02-11 | Disposition: A | Discharge status: Home / Self Care | Payer: Medicare Other | Source: Ambulatory Visit | Attending: Anesthesiology | Admitting: Anesthesiology

## 2016-02-11 ENCOUNTER — Encounter (HOSPITAL_COMMUNITY): Payer: Self-pay

## 2016-02-11 ENCOUNTER — Encounter (HOSPITAL_COMMUNITY)
Admission: RE | Admit: 2016-02-11 | Discharge: 2016-02-11 | Disposition: A | Discharge status: Home / Self Care | Payer: Medicare Other | Source: Ambulatory Visit | Attending: Otolaryngology | Admitting: Otolaryngology

## 2016-02-11 DIAGNOSIS — E039 Hypothyroidism, unspecified: Secondary | ICD-10-CM | POA: Diagnosis not present

## 2016-02-11 DIAGNOSIS — G43909 Migraine, unspecified, not intractable, without status migrainosus: Secondary | ICD-10-CM | POA: Diagnosis not present

## 2016-02-11 DIAGNOSIS — E785 Hyperlipidemia, unspecified: Secondary | ICD-10-CM | POA: Diagnosis not present

## 2016-02-11 DIAGNOSIS — Z87891 Personal history of nicotine dependence: Secondary | ICD-10-CM | POA: Diagnosis not present

## 2016-02-11 DIAGNOSIS — I10 Essential (primary) hypertension: Secondary | ICD-10-CM | POA: Diagnosis not present

## 2016-02-11 DIAGNOSIS — Z01818 Encounter for other preprocedural examination: Secondary | ICD-10-CM

## 2016-02-11 DIAGNOSIS — K219 Gastro-esophageal reflux disease without esophagitis: Secondary | ICD-10-CM | POA: Diagnosis not present

## 2016-02-11 DIAGNOSIS — E041 Nontoxic single thyroid nodule: Secondary | ICD-10-CM | POA: Diagnosis present

## 2016-02-11 DIAGNOSIS — E063 Autoimmune thyroiditis: Secondary | ICD-10-CM | POA: Diagnosis not present

## 2016-02-11 HISTORY — DX: Depression, unspecified: F32.A

## 2016-02-11 HISTORY — DX: Anxiety disorder, unspecified: F41.9

## 2016-02-11 HISTORY — DX: Personal history of other diseases of the respiratory system: Z87.09

## 2016-02-11 LAB — COMPREHENSIVE METABOLIC PANEL
ALK PHOS: 45 U/L (ref 38–126)
ALT: 24 U/L (ref 14–54)
AST: 21 U/L (ref 15–41)
Albumin: 3.8 g/dL (ref 3.5–5.0)
Anion gap: 9 (ref 5–15)
BILIRUBIN TOTAL: 0.4 mg/dL (ref 0.3–1.2)
BUN: 15 mg/dL (ref 6–20)
CALCIUM: 9.4 mg/dL (ref 8.9–10.3)
CO2: 28 mmol/L (ref 22–32)
Chloride: 102 mmol/L (ref 101–111)
Creatinine, Ser: 1.16 mg/dL — ABNORMAL HIGH (ref 0.44–1.00)
GFR calc Af Amer: 59 mL/min — ABNORMAL LOW (ref >60)
GFR calc non Af Amer: 50 mL/min — ABNORMAL LOW (ref >60)
Glucose, Bld: 96 mg/dL (ref 65–99)
Potassium: 3.7 mmol/L (ref 3.5–5.1)
SODIUM: 139 mmol/L (ref 135–145)
Total Protein: 6.4 g/dL — ABNORMAL LOW (ref 6.5–8.1)

## 2016-02-11 LAB — CBC
HCT: 37.6 % (ref 36.0–46.0)
Hemoglobin: 12 g/dL (ref 12.0–15.0)
MCH: 26.7 pg (ref 26.0–34.0)
MCHC: 31.9 g/dL (ref 30.0–36.0)
MCV: 83.6 fL (ref 78.0–100.0)
PLATELETS: 250 10*3/uL (ref 150–400)
RBC: 4.5 MIL/uL (ref 3.87–5.11)
RDW: 13.2 % (ref 11.5–15.5)
WBC: 6.2 10*3/uL (ref 4.0–10.5)

## 2016-02-11 NOTE — Progress Notes (Signed)
Anesthesia Chart Review: Patient is a 60 year old female scheduled for right thyroid lobectomy with frozen section, possible total thyroidectomy on 02/13/16 by Dr. Constance Holster. DX: Thyroid nodule (which was suspicious for cancer on Afirma testing)  History includes hepatitis C ("PCR undetectable 06/2014 post oral therapy" by Dr. Osborne Casco record), hypothyroidism, HTN, anxiety, depression, migraines, "borderline" DM2, DDD, arthritis, GERD, lumbar laminectomy '04, spinal cord stimulator '06, C4-6 ACDF '06, C4-7 posterior fusion '07, sinus surgery, cholecystectomy '02, right shoulder arthroscopy 12/06/14, tonsillectomy.   PCP is Dr. Osborne Casco. Endocrinologist is Dr. Chalmers Cater. Seen by Dr. Patsy Baltimore at [facility] for hepatitis in the past.   02/11/16 EKG: NSR, cannot rule out inferior infarct (age undetermined). No significant change since last tracing 12/03/14.   05/24/12 Nuclear stress test (ordered by Dr. Osborne Casco; done at [facility]; now [facility]): Normal myocardial perfusion study. No significant ischemia demonstrated. This is a low risk scan. EF 76%. No significant wall motion abnormalities.  02/11/16 CXR: IMPRESSION: No active cardiopulmonary disease.  Preoperative labs noted. Cr 1.16. AST/ALT, CBC WNL. Glucose 96.   If no acute changes then I would anticipate that she could proceed as planned.  George Hugh [facility] Short Stay Center/Anesthesiology Phone [phone] 02/11/2016 4:36 PM

## 2016-02-11 NOTE — Progress Notes (Signed)
PCP- Dr. Domenick Gong Cardiologist - denies  EKG- 02/11/16 CXR - 02/11/16 Echo- denies Stress test - requested from Dr. Osborne Casco Cardiac Cath - denies  Patient denies chest pain and shortness of breath at PAT appointment.  Patient informs nurse that she has never been informed that she is "pre-diabetic or has diabetes."

## 2016-02-13 ENCOUNTER — Observation Stay (HOSPITAL_COMMUNITY)
Admission: RE | Admit: 2016-02-13 | Discharge: 2016-02-14 | Disposition: A | Discharge status: Home / Self Care | Payer: Medicare Other | Source: Ambulatory Visit | Attending: Otolaryngology | Admitting: Otolaryngology

## 2016-02-13 ENCOUNTER — Encounter (HOSPITAL_COMMUNITY): Payer: Self-pay | Admitting: Surgery

## 2016-02-13 ENCOUNTER — Ambulatory Visit (HOSPITAL_COMMUNITY): Payer: Medicare Other | Admitting: Anesthesiology

## 2016-02-13 ENCOUNTER — Encounter (HOSPITAL_COMMUNITY)
Admission: RE | Disposition: A | Discharge status: Home / Self Care | Payer: Self-pay | Source: Ambulatory Visit | Attending: Otolaryngology

## 2016-02-13 ENCOUNTER — Ambulatory Visit (HOSPITAL_COMMUNITY): Payer: Medicare Other | Admitting: Vascular Surgery

## 2016-02-13 DIAGNOSIS — E039 Hypothyroidism, unspecified: Secondary | ICD-10-CM | POA: Diagnosis not present

## 2016-02-13 DIAGNOSIS — E890 Postprocedural hypothyroidism: Secondary | ICD-10-CM

## 2016-02-13 DIAGNOSIS — E785 Hyperlipidemia, unspecified: Secondary | ICD-10-CM | POA: Diagnosis not present

## 2016-02-13 DIAGNOSIS — K219 Gastro-esophageal reflux disease without esophagitis: Secondary | ICD-10-CM | POA: Insufficient documentation

## 2016-02-13 DIAGNOSIS — E063 Autoimmune thyroiditis: Principal | ICD-10-CM | POA: Insufficient documentation

## 2016-02-13 DIAGNOSIS — G43909 Migraine, unspecified, not intractable, without status migrainosus: Secondary | ICD-10-CM | POA: Diagnosis not present

## 2016-02-13 DIAGNOSIS — I10 Essential (primary) hypertension: Secondary | ICD-10-CM | POA: Insufficient documentation

## 2016-02-13 DIAGNOSIS — Z9889 Other specified postprocedural states: Secondary | ICD-10-CM

## 2016-02-13 DIAGNOSIS — Z87891 Personal history of nicotine dependence: Secondary | ICD-10-CM | POA: Insufficient documentation

## 2016-02-13 HISTORY — PX: THYROID LOBECTOMY: SHX420

## 2016-02-13 HISTORY — PX: THYROIDECTOMY: SHX17

## 2016-02-13 SURGERY — THYROIDECTOMY
Anesthesia: General | Site: Neck | Laterality: Right

## 2016-02-13 MED ORDER — MELOXICAM 7.5 MG PO TABS: 7.5000 mg | ORAL_TABLET | Freq: Every day | ORAL | Status: DC | PRN

## 2016-02-13 MED ORDER — ONDANSETRON HCL 4 MG PO TABS: 4.0000 mg | ORAL_TABLET | ORAL | Status: DC | PRN

## 2016-02-13 MED ORDER — LIDOCAINE HCL (CARDIAC) 20 MG/ML IV SOLN
INTRAVENOUS | Status: DC | PRN
  Administered 2016-02-13: 60 mg via INTRAVENOUS

## 2016-02-13 MED ORDER — 0.9 % SODIUM CHLORIDE (POUR BTL) OPTIME
TOPICAL | Status: DC | PRN
  Administered 2016-02-13: 1000 mL

## 2016-02-13 MED ORDER — FENTANYL CITRATE (PF) 100 MCG/2ML IJ SOLN
INTRAMUSCULAR | Status: DC | PRN
  Administered 2016-02-13: 50 ug via INTRAVENOUS
  Administered 2016-02-13 (×2): 100 ug via INTRAVENOUS

## 2016-02-13 MED ORDER — TRAZODONE HCL 50 MG PO TABS
50.0000 mg | ORAL_TABLET | Freq: Every evening | ORAL | Status: DC | PRN
  Administered 2016-02-13: 50 mg via ORAL
  Filled 2016-02-13: qty 1

## 2016-02-13 MED ORDER — LIDOCAINE 2% (20 MG/ML) 5 ML SYRINGE
INTRAMUSCULAR | Status: AC
  Filled 2016-02-13: qty 5

## 2016-02-13 MED ORDER — ONDANSETRON HCL 4 MG/2ML IJ SOLN
INTRAMUSCULAR | Status: DC | PRN
  Administered 2016-02-13: 4 mg via INTRAVENOUS

## 2016-02-13 MED ORDER — OXYCODONE HCL 5 MG PO TABS
5.0000 mg | ORAL_TABLET | Freq: Once | ORAL | Status: AC
  Administered 2016-02-13: 5 mg via ORAL

## 2016-02-13 MED ORDER — ONDANSETRON 4 MG PO TBDP: 4.0000 mg | ORAL_TABLET | Freq: Three times a day (TID) | ORAL | Status: DC | PRN

## 2016-02-13 MED ORDER — DEXAMETHASONE SODIUM PHOSPHATE 10 MG/ML IJ SOLN
INTRAMUSCULAR | Status: DC | PRN
  Administered 2016-02-13: 10 mg via INTRAVENOUS

## 2016-02-13 MED ORDER — TRAMADOL HCL 50 MG PO TABS
50.0000 mg | ORAL_TABLET | Freq: Four times a day (QID) | ORAL | Status: DC
Start: 2016-02-13 — End: 2016-02-14
  Administered 2016-02-13 – 2016-02-14 (×4): 50 mg via ORAL
  Filled 2016-02-13 (×4): qty 1

## 2016-02-13 MED ORDER — SUMATRIPTAN SUCCINATE 50 MG PO TABS: 50.0000 mg | ORAL_TABLET | ORAL | Status: DC | PRN

## 2016-02-13 MED ORDER — LISINOPRIL-HYDROCHLOROTHIAZIDE 20-12.5 MG PO TABS: 1.0000 | ORAL_TABLET | ORAL | Status: DC

## 2016-02-13 MED ORDER — RIZATRIPTAN BENZOATE 10 MG PO TABS: 10.0000 mg | ORAL_TABLET | Freq: Once | ORAL | Status: DC | PRN

## 2016-02-13 MED ORDER — ONDANSETRON HCL 4 MG/2ML IJ SOLN
4.0000 mg | INTRAMUSCULAR | Status: DC | PRN
  Administered 2016-02-14: 4 mg via INTRAVENOUS
  Filled 2016-02-13: qty 2

## 2016-02-13 MED ORDER — PROPOFOL 10 MG/ML IV BOLUS
INTRAVENOUS | Status: AC
  Filled 2016-02-13: qty 20

## 2016-02-13 MED ORDER — PRAVASTATIN SODIUM 20 MG PO TABS
20.0000 mg | ORAL_TABLET | Freq: Every day | ORAL | Status: DC
  Administered 2016-02-13: 20 mg via ORAL
  Filled 2016-02-13: qty 2
  Filled 2016-02-13: qty 1

## 2016-02-13 MED ORDER — LACTATED RINGERS IV SOLN
INTRAVENOUS | Status: DC
Start: 2016-02-13 — End: 2016-02-14
  Administered 2016-02-13 (×2): via INTRAVENOUS

## 2016-02-13 MED ORDER — PROPOFOL 10 MG/ML IV BOLUS
INTRAVENOUS | Status: DC | PRN
  Administered 2016-02-13: 150 mg via INTRAVENOUS
  Administered 2016-02-13: 50 mg via INTRAVENOUS

## 2016-02-13 MED ORDER — PHENYLEPHRINE HCL 10 MG/ML IJ SOLN
INTRAMUSCULAR | Status: DC | PRN
  Administered 2016-02-13: 120 ug via INTRAVENOUS
  Administered 2016-02-13: 240 ug via INTRAVENOUS

## 2016-02-13 MED ORDER — SUCCINYLCHOLINE CHLORIDE 200 MG/10ML IV SOSY
PREFILLED_SYRINGE | INTRAVENOUS | Status: AC
  Filled 2016-02-13: qty 10

## 2016-02-13 MED ORDER — FENTANYL CITRATE (PF) 250 MCG/5ML IJ SOLN
INTRAMUSCULAR | Status: AC
  Filled 2016-02-13: qty 5

## 2016-02-13 MED ORDER — ROCURONIUM BROMIDE 100 MG/10ML IV SOLN
INTRAVENOUS | Status: DC | PRN
  Administered 2016-02-13: 30 mg via INTRAVENOUS

## 2016-02-13 MED ORDER — SUCCINYLCHOLINE CHLORIDE 20 MG/ML IJ SOLN
INTRAMUSCULAR | Status: DC | PRN
Start: 2016-02-13 — End: 2016-02-13
  Administered 2016-02-13: 100 mg via INTRAVENOUS

## 2016-02-13 MED ORDER — MIDAZOLAM HCL 5 MG/5ML IJ SOLN
INTRAMUSCULAR | Status: DC | PRN
  Administered 2016-02-13: 2 mg via INTRAVENOUS

## 2016-02-13 MED ORDER — PHENYLEPHRINE 40 MCG/ML (10ML) SYRINGE FOR IV PUSH (FOR BLOOD PRESSURE SUPPORT)
PREFILLED_SYRINGE | INTRAVENOUS | Status: AC
  Filled 2016-02-13: qty 10

## 2016-02-13 MED ORDER — OXYCODONE-ACETAMINOPHEN 5-325 MG PO TABS
1.0000 | ORAL_TABLET | ORAL | Status: DC | PRN
  Administered 2016-02-13 – 2016-02-14 (×4): 2 via ORAL
  Filled 2016-02-13 (×4): qty 2

## 2016-02-13 MED ORDER — ROCURONIUM BROMIDE 50 MG/5ML IV SOLN
INTRAVENOUS | Status: AC
Start: 2016-02-13 — End: 2016-02-13
  Filled 2016-02-13: qty 1

## 2016-02-13 MED ORDER — HYDROMORPHONE HCL 1 MG/ML IJ SOLN
0.2500 mg | INTRAMUSCULAR | Status: DC | PRN
  Administered 2016-02-13 (×2): 0.5 mg via INTRAVENOUS
  Administered 2016-02-13 (×2): 0.25 mg via INTRAVENOUS

## 2016-02-13 MED ORDER — POLYETHYLENE GLYCOL 3350 17 G PO PACK: 17.0000 g | PACK | Freq: Every day | ORAL | Status: DC | PRN

## 2016-02-13 MED ORDER — OXYCODONE HCL 5 MG PO TABS
ORAL_TABLET | ORAL | Status: AC
  Filled 2016-02-13: qty 1

## 2016-02-13 MED ORDER — HYDROCHLOROTHIAZIDE 12.5 MG PO CAPS
12.5000 mg | ORAL_CAPSULE | Freq: Every day | ORAL | Status: DC
  Administered 2016-02-14: 12.5 mg via ORAL
  Filled 2016-02-13: qty 1

## 2016-02-13 MED ORDER — FLUTICASONE PROPIONATE 50 MCG/ACT NA SUSP
2.0000 | Freq: Every day | NASAL | Status: DC | PRN
  Filled 2016-02-13: qty 16

## 2016-02-13 MED ORDER — SUGAMMADEX SODIUM 200 MG/2ML IV SOLN
INTRAVENOUS | Status: DC | PRN
  Administered 2016-02-13: 160.6 mg via INTRAVENOUS

## 2016-02-13 MED ORDER — HYDROMORPHONE HCL 1 MG/ML IJ SOLN
INTRAMUSCULAR | Status: AC
  Filled 2016-02-13: qty 1

## 2016-02-13 MED ORDER — DEXTROSE-NACL 5-0.9 % IV SOLN
INTRAVENOUS | Status: DC
  Administered 2016-02-13 – 2016-02-14 (×3): via INTRAVENOUS

## 2016-02-13 MED ORDER — MIDAZOLAM HCL 2 MG/2ML IJ SOLN
INTRAMUSCULAR | Status: AC
  Filled 2016-02-13: qty 2

## 2016-02-13 MED ORDER — PANTOPRAZOLE SODIUM 40 MG PO TBEC
40.0000 mg | DELAYED_RELEASE_TABLET | Freq: Every day | ORAL | Status: DC
  Administered 2016-02-14: 40 mg via ORAL
  Filled 2016-02-13: qty 1

## 2016-02-13 MED ORDER — DEXAMETHASONE SODIUM PHOSPHATE 10 MG/ML IJ SOLN
INTRAMUSCULAR | Status: AC
  Filled 2016-02-13: qty 1

## 2016-02-13 MED ORDER — TRAMADOL HCL 50 MG PO TABS: 50.0000 mg | ORAL_TABLET | Freq: Four times a day (QID) | ORAL | Status: DC | PRN

## 2016-02-13 MED ORDER — FLUOXETINE HCL 20 MG PO CAPS
40.0000 mg | ORAL_CAPSULE | Freq: Every day | ORAL | Status: DC
  Administered 2016-02-14: 40 mg via ORAL
  Filled 2016-02-13: qty 2

## 2016-02-13 MED ORDER — LISINOPRIL 20 MG PO TABS
20.0000 mg | ORAL_TABLET | Freq: Every day | ORAL | Status: DC
  Administered 2016-02-14: 20 mg via ORAL
  Filled 2016-02-13: qty 1

## 2016-02-13 MED ORDER — LEVOTHYROXINE SODIUM 112 MCG PO TABS
112.0000 ug | ORAL_TABLET | Freq: Every day | ORAL | Status: DC
  Administered 2016-02-14: 112 ug via ORAL
  Filled 2016-02-13: qty 1

## 2016-02-13 SURGICAL SUPPLY — 44 items
ADH SKN CLS APL DERMABOND .7 (GAUZE/BANDAGES/DRESSINGS) ×1
BLADE SURG 15 STRL LF DISP TIS (BLADE) IMPLANT
BLADE SURG 15 STRL SS (BLADE)
CANISTER SUCTION 2500CC (MISCELLANEOUS) ×2 IMPLANT
CLEANER TIP ELECTROSURG 2X2 (MISCELLANEOUS) ×2 IMPLANT
CONT SPEC 4OZ CLIKSEAL STRL BL (MISCELLANEOUS) ×2 IMPLANT
CORDS BIPOLAR (ELECTRODE) ×2 IMPLANT
COVER SURGICAL LIGHT HANDLE (MISCELLANEOUS) ×2 IMPLANT
DERMABOND ADVANCED (GAUZE/BANDAGES/DRESSINGS) ×1
DERMABOND ADVANCED .7 DNX12 (GAUZE/BANDAGES/DRESSINGS) ×1 IMPLANT
DRAIN HEMOVAC 7FR (DRAIN) IMPLANT
DRAIN JACKSON RD 7FR 3/32 (WOUND CARE) ×1 IMPLANT
DRAIN SNY 10 ROU (WOUND CARE) IMPLANT
DRAPE PROXIMA HALF (DRAPES) IMPLANT
ELECT COATED BLADE 2.86 ST (ELECTRODE) ×2 IMPLANT
ELECT REM PT RETURN 9FT ADLT (ELECTROSURGICAL) ×2
ELECTRODE REM PT RTRN 9FT ADLT (ELECTROSURGICAL) ×1 IMPLANT
EVACUATOR SILICONE 100CC (DRAIN) ×2 IMPLANT
FORCEPS BIPOLAR SPETZLER 8 1.0 (NEUROSURGERY SUPPLIES) ×2 IMPLANT
GAUZE SPONGE 4X4 16PLY XRAY LF (GAUZE/BANDAGES/DRESSINGS) IMPLANT
GLOVE BIO SURGEON STRL SZ 6.5 (GLOVE) ×2 IMPLANT
GLOVE BIOGEL PI IND STRL 6.5 (GLOVE) IMPLANT
GLOVE BIOGEL PI INDICATOR 6.5 (GLOVE) ×2
GLOVE ECLIPSE 7.5 STRL STRAW (GLOVE) ×2 IMPLANT
GLOVE ECLIPSE 8.0 STRL XLNG CF (GLOVE) ×1 IMPLANT
GOWN BRE IMP SLV AUR XL STRL (GOWN DISPOSABLE) ×1 IMPLANT
GOWN STRL REUS W/ TWL LRG LVL3 (GOWN DISPOSABLE) ×2 IMPLANT
GOWN STRL REUS W/TWL LRG LVL3 (GOWN DISPOSABLE) ×4
KIT BASIN OR (CUSTOM PROCEDURE TRAY) ×2 IMPLANT
KIT ROOM TURNOVER OR (KITS) ×2 IMPLANT
NDL 27GX1/2 REG BEVEL ECLIP (NEEDLE) IMPLANT
NEEDLE 27GAX1/2IN MONOJET (NEEDLE) ×1 IMPLANT
NEEDLE 27GX1/2 REG BEVEL ECLIP (NEEDLE) IMPLANT
NS IRRIG 1000ML POUR BTL (IV SOLUTION) ×2 IMPLANT
PAD ARMBOARD 7.5X6 YLW CONV (MISCELLANEOUS) ×4 IMPLANT
PENCIL FOOT CONTROL (ELECTRODE) ×2 IMPLANT
SHEARS HARMONIC 9CM CVD (BLADE) ×2 IMPLANT
STAPLER VISISTAT 35W (STAPLE) ×2 IMPLANT
SUT CHROMIC 4 0 PS 2 18 (SUTURE) ×3 IMPLANT
SUT ETHILON 3 0 PS 1 (SUTURE) ×2 IMPLANT
SUT SILK 3 0 REEL (SUTURE) IMPLANT
SUT SILK 4 0 REEL (SUTURE) ×2 IMPLANT
TOWEL OR 17X24 6PK STRL BLUE (TOWEL DISPOSABLE) ×2 IMPLANT
TRAY ENT MC OR (CUSTOM PROCEDURE TRAY) ×2 IMPLANT

## 2016-02-13 NOTE — Care Management Obs Status (Signed)
[facility] NOTIFICATION   Patient Details  Name: [name] MRN: [id number] Date of Birth: [birth year]   Medicare Observation Status Notification Given:  Yes (Medicare observation procedure)    Marilu Favre, RN 02/13/2016, 2:36 PM

## 2016-02-13 NOTE — Anesthesia Postprocedure Evaluation (Signed)
Anesthesia Post Note  Patient: [name]  Procedure(s) Performed: Procedure(s) (LRB): RIGHT THYROID LOBECTOMY WITH FROZEN SECTION  (Right)  Patient location during evaluation: PACU Anesthesia Type: General Level of consciousness: awake and alert Pain management: pain level controlled Vital Signs Assessment: post-procedure vital signs reviewed and stable Respiratory status: spontaneous breathing, nonlabored ventilation, respiratory function stable and patient connected to nasal cannula oxygen Cardiovascular status: blood pressure returned to baseline and stable Postop Assessment: no signs of nausea or vomiting (mild headache) Anesthetic complications: no    Last Vitals:  Filed Vitals:   02/13/16 1145 02/13/16 1200  BP: 122/70 107/92  Pulse: 85 77  Temp:    Resp: 16 14    Last Pain:  Filed Vitals:   02/13/16 1208  PainSc: 6                  Petar Mucci,JAMES TERRILL

## 2016-02-13 NOTE — Transfer of Care (Signed)
Immediate Anesthesia Transfer of Care Note  Patient: [name]  Procedure(s) Performed: Procedure(s): RIGHT THYROID LOBECTOMY WITH FROZEN SECTION  (Right)  Patient Location: PACU  Anesthesia Type:General  Level of Consciousness: awake, alert  and oriented  Airway & Oxygen Therapy: Patient Spontanous Breathing and Patient connected to nasal cannula oxygen  Post-op Assessment: Report given to RN, Post -op Vital signs reviewed and stable and Patient moving all extremities  Post vital signs: Reviewed and stable  Last Vitals:  Filed Vitals:   02/13/16 0757  BP: 129/54  Pulse: 66  Temp: 36.8 C  Resp: 20    Last Pain: There were no vitals filed for this visit.       Complications: No apparent anesthesia complications

## 2016-02-13 NOTE — Anesthesia Preprocedure Evaluation (Signed)
Anesthesia Evaluation  Patient identified by MRN, date of birth, ID band Patient awake    Airway Mallampati: II  TM Distance: <3 FB Neck ROM: Full    Dental  (+) Teeth Intact   Pulmonary neg pulmonary ROS,    breath sounds clear to auscultation       Cardiovascular hypertension,  Rhythm:Regular Rate:Normal     Neuro/Psych    GI/Hepatic GERD  ,(+) Hepatitis -  Endo/Other  diabetesHypothyroidism   Renal/GU      Musculoskeletal   Abdominal   Peds  Hematology   Anesthesia Other Findings   Reproductive/Obstetrics                             Anesthesia Physical Anesthesia Plan  ASA: II  Anesthesia Plan: General   Post-op Pain Management:    Induction: Intravenous  Airway Management Planned: Video Laryngoscope Planned  Additional Equipment:   Intra-op Plan:   Post-operative Plan: Extubation in OR  Informed Consent: I have reviewed the patients History and Physical, chart, labs and discussed the procedure including the risks, benefits and alternatives for the proposed anesthesia with the patient or authorized representative who has indicated his/her understanding and acceptance.   Dental advisory given  Plan Discussed with: CRNA and Surgeon  Anesthesia Plan Comments:         Anesthesia Quick Evaluation

## 2016-02-13 NOTE — Op Note (Signed)
OPERATIVE REPORT  DATE OF SURGERY: 02/13/2016  PATIENT:  [name],  60 y.o. female  PRE-OPERATIVE DIAGNOSIS:  thyroid nodule right side  POST-OPERATIVE DIAGNOSIS:  thyroid nodule right side  PROCEDURE:  Procedure(s): RIGHT THYROID LOBECTOMY WITH FROZEN SECTION   SURGEON:  Beckie Salts, MD  ASSISTANTS:  [name], M.D.  ANESTHESIA:   General   EBL:  10 ml  DRAINS: 7 [place] round JP  LOCAL MEDICATIONS USED:  None  SPECIMEN:  Right thyroid lobe, frozen section, no papillary carcinoma identified  COUNTS:  Correct  PROCEDURE DETAILS: The patient was taken to the operating room and placed on the operating table in the supine position. A shoulder roll was placed beneath the shoulder blades and the neck was extended. The neck was prepped and draped in a standard fashion. A low collar transverse incision was outlined with a marking pen and was incised with Electrocautery . Dissection was continued down through the platysma layer.  Self-retaining retractors were used throughout the case.  The midline fascia was divided. The right lobe was identified and was reflected anteriorly inferiorly. The superior pole was dissected free of surrounding vasculature using the harmonic dissector. Dissection was immediately adjacent to the capsule. The recurrent nerve and superior parathyroid were identified and preserved with their blood supply. The middle thyroid vein and inferior vasculature were identified and divided using the harmonic dissector. The gland was brought off the trachea and the isthmus was divided with the harmonic dissector as well.The right lobe was sent for frozen section analysis. There was no carcinoma identified. The wound was irrigated with saline. Hemostasis was completed using bipolar cautery. A drain was placed through separate lower stab incision. Nylon suture was used to secure the drain. The midline fascia was reapproximated with a single 4-0 chromic suture. The  platysma was reapproximated with 4-0 interrupted chromic and a subcuticular running closure was completed. Dermabond was used on the skin. The drain was charged. The patient was awakened, extubated, and transferred to PACU in stable condition.  PATIENT DISPOSITION:  To PACU.

## 2016-02-13 NOTE — H&P (View-Only) (Signed)
Progress Notes Encounter Date: 01/23/2016 Beckie Salts, MD  Otolaryngology  Expand All Collapse All   [] Hide copied text Otolaryngology Office Note  HPI:   [name] is a 60 y.o. female who presents as a consult patient with a chief complaint of Thyroid nodule. She has had a thyroid nodule on the right side for years but recently got larger, up to 13 mm. FNA was performed and Afirma testing was suspicious for carcinoma. She has a long history of hypothyroidism. She has had multiple back and neck surgeries.  PMH/Meds/All/SocHx/FamHx/ROS:    Past Medical History       Past Medical History  Diagnosis Date  . Acid reflux   . Hyperlipidemia   . Hypertension   . Hypothyroidism   . Migraine        Past Surgical History         Past Surgical History  Procedure Laterality Date  . Wisdom tooth extraction    . Breast biopsy    . Cholecystectomy    . Tubal ligation    . Shoulder surgery    . Neck surgery    . Back surgery    . Sinus surgery      left upper sinus lift      No family history of bleeding disorders, wound healing problems or difficulty with anesthesia.    Social History   Social History        Social History  . Marital status: Married    Spouse name: N/A  . Number of children: N/A  . Years of education: N/A      Occupational History  . Not on file.          Social History Main Topics  . Smoking status: Former Smoker    Quit date: 1992  . Smokeless tobacco: Never Used  . Alcohol use Yes      Comment: social  . Drug use: Not on file  . Sexual activity: Not on file       Other Topics Concern  . Not on file      Social History Narrative  . No narrative on file       Current Outpatient Prescriptions:  . FLUoxetine (PROZAC) 40 MG capsule, , Disp: , Rfl:  . fluticasone (FLONASE) 50 mcg/actuation nasal spray, 2 sprays by Nasal route., Disp: , Rfl:  . levothyroxine (SYNTHROID, LEVOTHROID) 125  MCG tablet, , Disp: , Rfl:  . lisinopril-hydrochlorothiazide (PRINZIDE,ZESTORETIC) 20-12.5 mg per tablet, , Disp: , Rfl:  . meloxicam (MOBIC) 7.5 MG tablet, , Disp: , Rfl:  . omeprazole (PRILOSEC) 20 MG capsule, Take by mouth., Disp: , Rfl:  . polyethylene glycol (MIRALAX) 17 gram packet, Take by mouth., Disp: , Rfl:  . pravastatin (PRAVACHOL) 20 MG tablet, , Disp: , Rfl:  . rizatriptan (MAXALT) 10 MG tablet, TAKE 1 TABLET ONCE DAILY AS NEEDED, MAY REPEAT IN 2 HOURS, MAX 2 IN 24 HOURS AS NEEDED FOR HEADACHE, Disp: , Rfl: 5 . traZODone (DESYREL) 50 MG tablet, , Disp: , Rfl:  . levothyroxine (SYNTHROID, LEVOTHROID) 137 MCG tablet, TAKE 1 TABLET BY MOUTH ONCE A DAY, Disp: , Rfl: 6  A complete ROS was performed with pertinent positives/negatives noted in the HPI. The remainder of the ROS are negative.   Physical Exam:        Visit Vitals  . BP 120/80  . Ht 1.702 m (5\' 7" )  . Wt 80.7 kg (178 lb)  . BMI 27.88 kg/m2  General: Healthy and alert, in no distress, breathing easily. Normal affect. In a pleasant mood. Head: Normocephalic, atraumatic. No masses, or scars. Eyes: Pupils are equal, and reactive to light. Vision is grossly intact. No spontaneous or gaze nystagmus. Ears: Ear canals are clear. Tympanic membranes are intact, with normal landmarks and the middle ears are clear and healthy. Hearing: Grossly normal. Nose. Nasal cavities are clear with healthy mucosa, no polyps or exudate.Airways are patent. Face: No masses or scars, facial nerve function is symmetric. Oral Cavity: No mucosal abnormalities are noted. Tongue with normal mobility. Dentition appears healthy. Oropharynx: Tonsils are symmetric. There are no mucosal masses identified. Tongue base appears normal and healthy. Larynx/Hypopharynx: indirect exam reveals healthy, mobile vocal cords, without mucosal lesions in the hypopharynx or larynx. Neck: No palpable masses, no cervical adenopathy, Very small nodule  barely palpable right anterior thyroid. Otherwise no palpable neck masses. Neuro: Cranial nerves II-XII will normal function. Balance: Normal gate. Other findings: none.   Independent Review of Additional Tests or Records:  none  Procedures:  none   Impression & Plans:  [name] is a 60 y.o. female with 13 mm right sided thyroid nodule, suspicious for cancer on Afirma testing. We discussed that there is a high probability that this is a very small differentiated cancer. We discussed the possibility that this may never caused her any problems and perhaps has always been present. We discussed that it is impossible to predict if this will become a problematic cancer. Options were discussed including observation with serial ultrasounds to see if there is any increase in size over time, or thyroid surgery. She is anxious about this and is very interested in surgery. Risks and benefits were discussed including the risk to the recurrent nerve, and hypocalcemia. She understands and agrees. All questions were answered. Handouts were provided.

## 2016-02-13 NOTE — Anesthesia Procedure Notes (Signed)
Procedure Name: Intubation Date/Time: 02/13/2016 9:22 AM Performed by: Merrilyn Puma B Pre-anesthesia Checklist: Patient identified, Emergency Drugs available, Timeout performed, Suction available and Patient being monitored Patient Re-evaluated:Patient Re-evaluated prior to inductionOxygen Delivery Method: Circle system utilized Preoxygenation: Pre-oxygenation with 100% oxygen Intubation Type: IV induction and Cricoid Pressure applied Ventilation: Mask ventilation without difficulty Laryngoscope Size: Mac and 3 Grade View: Grade III Tube type: Oral Tube size: 7.0 mm Number of attempts: 1 Airway Equipment and Method: Stylet Placement Confirmation: CO2 detector,  positive ETCO2,  breath sounds checked- equal and bilateral and ETT inserted through vocal cords under direct vision Secured at: 21 cm Tube secured with: Tape Dental Injury: Teeth and Oropharynx as per pre-operative assessment  Difficulty Due To: Difficulty was anticipated, Difficult Airway- due to limited oral opening and Difficult Airway- due to reduced neck mobility Comments: Pt has limited neck extension and small mouth opening.  Previous intubation with glidescope at day surgery.  DLX1 with MAC 3, grade 3 view obtained with cricoid pressure.  Easy mask ventilation.

## 2016-02-13 NOTE — Progress Notes (Signed)
02/13/2016 5:10 PM  [name], [name] [id number]  Post-Op Check   Temp:  [97.1 F (36.2 C)-98.2 F (36.8 C)] 97.1 F (36.2 C) (04/28 1332) Pulse Rate:  [66-85] 80 (04/28 1332) Resp:  [13-20] 15 (04/28 1332) BP: (107-136)/(51-92) 120/55 mmHg (04/28 1332) SpO2:  [93 %-100 %] 95 % (04/28 1332) Weight:  [80.287 kg (177 lb)] 80.287 kg (177 lb) (04/28 0759),     Intake/Output Summary (Last 24 hours) at 02/13/16 1710 Last data filed at 02/13/16 1215  Gross per 24 hour  Intake   1200 ml  Output     12 ml  Net   1188 ml   JP 7 ml  No results found for this or any previous visit (from the past 24 hour(s)).  SUBJECTIVE:  Mod large pain.  Breathing well. Voice clear. Drinking, voiding.  OBJECTIVE:  Voice clear.  Breathing easily.  Neck flat.  Drain functional  IMPRESSION:  Satisfactory check  PLAN:  Anticipate discharge in AM.  Erik Obey, Ileene Hutchinson

## 2016-02-13 NOTE — Interval H&P Note (Signed)
History and Physical Interval Note:  02/13/2016 8:52 AM  [name]  has presented today for surgery, with the diagnosis of thyroid nodule right side  The various methods of treatment have been discussed with the patient and family. After consideration of risks, benefits and other options for treatment, the patient has consented to  Procedure(s): RIGHT THYROID LOBECTOMY WITH FROZEN SECTION POSSIBLE TOTAL THYROIDECTOMY (N/A) as a surgical intervention .  The patient's history has been reviewed, patient examined, no change in status, stable for surgery.  I have reviewed the patient's chart and labs.  Questions were answered to the patient's satisfaction.     [name]

## 2016-02-14 DIAGNOSIS — E063 Autoimmune thyroiditis: Secondary | ICD-10-CM | POA: Diagnosis not present

## 2016-02-14 NOTE — Discharge Summary (Signed)
02/14/2016 9:35 AM  [name] [id number]  Post-Op Day 1, discharge summary    Temp:  [97.1 F (36.2 C)-98.3 F (36.8 C)] 98.3 F (36.8 C) (04/29 0504) Pulse Rate:  [65-85] 67 (04/29 0504) Resp:  [13-20] 17 (04/29 0504) BP: (101-136)/(51-92) 101/77 mmHg (04/29 0504) SpO2:  [93 %-98 %] 96 % (04/29 0504),     Intake/Output Summary (Last 24 hours) at 02/14/16 0935 Last data filed at 02/14/16 0523  Gross per 24 hour  Intake 2996.25 ml  Output     22 ml  Net 2974.25 ml   JP 17 ml   No results found for this or any previous visit (from the past 24 hour(s)).  SUBJECTIVE:  Pain improved.  Had a migraine last evening, now better.  Eating, drinking, breathing, voiding without difficulty  OBJECTIVE:  Wound flat.  Drain removed.  Voice strong and clear. Breathing easily  IMPRESSION:  Satisfactory check  PLAN:  Discharge home  Admit:  28 APR D/C: 29 APR Final Diagnosis: lymphocytic thyroiditis Proc:  RIGHT hemithyroidectomy, 28 APR Comp:  None Cond: ambulatory, pain controlled. Drain out.   Recheck:  Dr. Constance Holster, 2 weeks Rx:  Oxycodone Instructions written and given  Hosp Course:  Underwent RIGHT thyroid lobectomy on day of admission.  Frozen section negative for cancer.  Observed overnight with minimal drainage.  Had a migraine relieved with her usual medications.  Eating, drinking, voiding, breathing and speaking well.  Drain removed and discharged to home and care of family on POD 1.    [name]

## 2016-02-14 NOTE — Progress Notes (Signed)
Pt. Refused a bath. Pt stated she is expecting to go home today

## 2016-02-14 NOTE — Discharge Instructions (Signed)
You may shower and use soap and water. Do not use any creams, oils or ointment. No strenuous activity x 10 days. Recheck Dr. Constance Holster 2 weeks. [phone] for an appointment He will probably call you with the final pathology report this week later. Call with signs of bleeding (sudden swelling) or infection (more slow developing tender swelling) You may need your synthroid dose adjusted in 3-4 weeks.

## 2016-02-16 ENCOUNTER — Encounter (HOSPITAL_COMMUNITY): Payer: Self-pay | Admitting: Otolaryngology

## 2016-03-30 ENCOUNTER — Ambulatory Visit (INDEPENDENT_AMBULATORY_CARE_PROVIDER_SITE_OTHER): Payer: Medicare Other | Admitting: Gynecology

## 2016-03-30 ENCOUNTER — Encounter: Payer: Self-pay | Admitting: Gynecology

## 2016-03-30 VITALS — BP 128/76

## 2016-03-30 DIAGNOSIS — R350 Frequency of micturition: Secondary | ICD-10-CM | POA: Diagnosis not present

## 2016-03-30 DIAGNOSIS — R300 Dysuria: Secondary | ICD-10-CM | POA: Diagnosis not present

## 2016-03-30 DIAGNOSIS — N952 Postmenopausal atrophic vaginitis: Secondary | ICD-10-CM

## 2016-03-30 DIAGNOSIS — N898 Other specified noninflammatory disorders of vagina: Secondary | ICD-10-CM | POA: Diagnosis not present

## 2016-03-30 MED ORDER — NONFORMULARY OR COMPOUNDED ITEM: Status: DC

## 2016-03-30 NOTE — Progress Notes (Signed)
   HPI: Patient is a 60 year old who presented to the office today complaining of vaginal burning and pruritus and sore area on the labia. Review of patient's record indicated in March of this year she was treated for yeast infection with Diflucan 150 mg one by mouth. Patient infrequently has intercourse but is in a monogamous relationship. Patient is postmenopausal no hormone replacement therapy was treated in 2015 for hepatitis C and now negative. Patient denies any major dysuria slight discomfort some frequency. No back pain, no fever, chills, nausea, vomiting   ROS: A ROS was performed and pertinent positives and negatives are included in the history.  GENERAL: No fevers or chills. HEENT: No change in vision, no earache, sore throat or sinus congestion. NECK: No pain or stiffness. CARDIOVASCULAR: No chest pain or pressure. No palpitations. PULMONARY: No shortness of breath, cough or wheeze. GASTROINTESTINAL: No abdominal pain, nausea, vomiting or diarrhea, melena or bright red blood per rectum. GENITOURINARY: See above. MUSCULOSKELETAL: No joint or muscle pain, no back pain, no recent trauma. DERMATOLOGIC: No rash, no itching, no lesions. ENDOCRINE: No polyuria, polydipsia, no heat or cold intolerance. No recent change in weight. HEMATOLOGICAL: No anemia or easy bruising or bleeding. NEUROLOGIC: No headache, seizures, numbness, tingling or weakness. PSYCHIATRIC: No depression, no loss of interest in normal activity or change in sleep pattern.   PE: Blood pressure 128/76 Gen. appearance well-developed well-nourished female with the above-mentioned complaint Abdomen: Soft nontender no rebound or guarding Pelvic: Bartholin urethra Skene was within normal limits Vagina very tender on palpation no gross lesions or discharge noted Cervix: No gross lesions or discharge Bimanual exam not performed patient very tender no vaginal area Rectal exam: Not performed because of the above.  Urinalysis  demonstrated only few bacteria specimen submitted for culture  Wet prep essentially negative few bacteria    Assessment Plan: Patient with apparent atrophic vaginitis contributing to her symptoms and irritation. She is going to be prescribed estradiol 0.02% to apply before bedtime for 5 days and then begin applying twice a week. We will wait for the result of the urine culture. Risk benefits and pros and cons were discussed and literature information was provided all questions were answered.    Greater than 50% of time was spent in counseling and coordinating care of this patient.   Time of consultation: 15   Minutes.

## 2016-03-30 NOTE — Patient Instructions (Signed)

## 2016-03-31 LAB — URINALYSIS W MICROSCOPIC + REFLEX CULTURE
Bilirubin Urine: NEGATIVE
CRYSTALS: NONE SEEN [HPF]
Casts: NONE SEEN [LPF]
Glucose, UA: NEGATIVE
Hgb urine dipstick: NEGATIVE
KETONES UR: NEGATIVE
Leukocytes, UA: NEGATIVE
NITRITE: NEGATIVE
PH: 6 (ref 5.0–8.0)
Protein, ur: NEGATIVE
RBC / HPF: NONE SEEN RBC/HPF (ref <=2)
SPECIFIC GRAVITY, URINE: 1.025 (ref 1.001–1.035)
Yeast: NONE SEEN [HPF]

## 2016-03-31 LAB — WET PREP FOR TRICH, YEAST, CLUE
Clue Cells Wet Prep HPF POC: NONE SEEN
TRICH WET PREP: NONE SEEN
WBC, Wet Prep HPF POC: NONE SEEN
YEAST WET PREP: NONE SEEN

## 2016-04-02 LAB — URINE CULTURE
COLONY COUNT: NO GROWTH
ORGANISM ID, BACTERIA: NO GROWTH

## 2016-08-25 ENCOUNTER — Encounter: Payer: Self-pay | Admitting: Women's Health

## 2016-08-25 ENCOUNTER — Ambulatory Visit (INDEPENDENT_AMBULATORY_CARE_PROVIDER_SITE_OTHER): Payer: Medicare Other | Admitting: Women's Health

## 2016-08-25 VITALS — BP 110/80 | Ht 67.0 in | Wt 180.0 lb

## 2016-08-25 DIAGNOSIS — N898 Other specified noninflammatory disorders of vagina: Secondary | ICD-10-CM

## 2016-08-25 DIAGNOSIS — R300 Dysuria: Secondary | ICD-10-CM

## 2016-08-25 LAB — WET PREP FOR TRICH, YEAST, CLUE
Clue Cells Wet Prep HPF POC: NONE SEEN
TRICH WET PREP: NONE SEEN
WBC, Wet Prep HPF POC: NONE SEEN
Yeast Wet Prep HPF POC: NONE SEEN

## 2016-08-25 LAB — URINALYSIS W MICROSCOPIC + REFLEX CULTURE
BACTERIA UA: NONE SEEN [HPF]
Bilirubin Urine: NEGATIVE
CASTS: NONE SEEN [LPF]
Crystals: NONE SEEN [HPF]
GLUCOSE, UA: NEGATIVE
HGB URINE DIPSTICK: NEGATIVE
KETONES UR: NEGATIVE
Leukocytes, UA: NEGATIVE
NITRITE: NEGATIVE
PH: 6 (ref 5.0–8.0)
PROTEIN: NEGATIVE
RBC / HPF: NONE SEEN RBC/HPF (ref <=2)
Specific Gravity, Urine: <1.005 (ref 1.001–1.035)
WBC, UA: NONE SEEN WBC/HPF (ref <=5)
Yeast: NONE SEEN [HPF]

## 2016-08-25 NOTE — Progress Notes (Signed)
Presents for symptoms of vaginal burning, purittis, and yellow discharge x 2 weeks. Denies being sexually active secondary to pain intercourse. Denies malodorous discharge, bleeding, abdominal pain, or fever. Postmenopausal/ estradiol vaginal cream twice weekly.  Exam: Appears uncomfortable. External genitalia irritated, erythematous, and atrophic, very uncomfortable with speculum, wet prep done with a Q-tip negative. UA: Negative  Atrophic Vaginitis  Plan: Urine culture pending. Refilled vaginal estrogen Estradiol 0.02%, continue usage 2 times a week. Prescribed lidocaine 2% gel. Reviewed usage instructions. Instructed to wear loose clothing and let  open to air as able. Instructed to call if no relief in symptoms.

## 2016-09-27 ENCOUNTER — Encounter: Payer: Self-pay | Admitting: Neurology

## 2016-09-27 ENCOUNTER — Ambulatory Visit (INDEPENDENT_AMBULATORY_CARE_PROVIDER_SITE_OTHER): Payer: Medicare Other | Admitting: Neurology

## 2016-09-27 VITALS — BP 104/62 | HR 72 | Resp 16 | Ht 67.0 in | Wt 180.0 lb

## 2016-09-27 DIAGNOSIS — G8929 Other chronic pain: Secondary | ICD-10-CM

## 2016-09-27 DIAGNOSIS — M961 Postlaminectomy syndrome, not elsewhere classified: Secondary | ICD-10-CM | POA: Diagnosis not present

## 2016-09-27 DIAGNOSIS — G4731 Primary central sleep apnea: Secondary | ICD-10-CM | POA: Diagnosis not present

## 2016-09-27 DIAGNOSIS — R809 Proteinuria, unspecified: Secondary | ICD-10-CM | POA: Insufficient documentation

## 2016-09-27 DIAGNOSIS — R0683 Snoring: Secondary | ICD-10-CM | POA: Diagnosis not present

## 2016-09-27 DIAGNOSIS — I10 Essential (primary) hypertension: Secondary | ICD-10-CM | POA: Insufficient documentation

## 2016-09-27 DIAGNOSIS — N183 Chronic kidney disease, stage 3 unspecified: Secondary | ICD-10-CM | POA: Insufficient documentation

## 2016-09-27 NOTE — Progress Notes (Signed)
SLEEP MEDICINE CLINIC   Provider:  Larey Seat, M D  Referring Provider: Haywood Pao, MD Primary Care Physician:  Haywood Pao, MD  Chief Complaint  Patient presents with  . Sleep Consult    Rm 11. Patient last had a sleep study in 2009. She was not placed on CPAP. Patient has trouble falling and staying asleep, snores, talks in her sleep, restless sleep, wakes up feeling tired, daytime fatigue.     HPI:  [name] is a 60 y.o. female , seen here as a referral from Dr. Osborne Casco for a new evaluation of sleep disorder.   I have last seen [name] in the year 2009 or 2010. At the time she had already trouble with insomnia she also suffered from back pain and joint pain multiple locations, and in the meantime her medical history has progressed. But she still has insomnia, excessive daytime sleepiness, snoring and sleep talking she also has still back pain, depression, and feels excessively daytime fatigued. She has followed Dr. Glenna Fellows, her neurosurgeon in the years 2006 and 2007 when she underwent cervical decompression and fusion. Then again in October 31 last year she did laminectomy with decompression of T12-L1 and a spinal neurostimulator was implanted. This is replacing an instrument that was placed first in 2006.  [name] again suffers from back pain but just flared up over the last 3 or 4 weeks again. The NP at Dr. Rex Kras office  gave her a steroid Dosepak, which did not help. She was placed on gabapentin but has not yet appreciated any effect. He is a revisit with Dr. Thurmond Butts next week. In the meantime Dr. to select saw her on November 14 and asked to schedule her for a visit with me because of interference with sleep, excessive daytime sleepiness, restless legs and snoring. Her husband, who is also my patient, uses CPAP and is a rather hard sleeper. He is not sure that he has witnessed any apneas- she just stops snoring. She is on multiple pain medication daily . She  begun talking in her sleep, feeling groggy in days, and she quit taking trazodone.   Chief complaint according to patient : " I need better sleep"  Sleep habits are as follows: She usually goes to bed around 10:30 PM and it will take her about an hour to go to sleep, some evenings she is still awake after midnight. They have one bathroom break between midnight and 1, 2 and 3 AM, and her last one around 5 AM. Each time she has difficulties reinitiating sleep. The bedroom is cool quiet and dark, she is forced to sleep supine due to her back condition. She uses 1 pillow for head support, no other pillows. She rises usually at about 8:30 AM but rarely feels refreshed or restored now, by 10 she is tired and sleepy again. She does not report morning headaches, she's not woken by headaches but by back pain. She does not feel she has an excessively dry mouth in the morning or dry eyes.    Sleep medical history and family sleep history: OSA too mild to treat in 2009     Social history: married, on disability since 2006,  no tobacco use, she will drink a beer on occasion not daily. She will drink a cup of iced tea 3-4 times a week, drinks de caffeinated coffee in am, 2 cups.   Review of Systems: Out of a complete 14 system review, the patient complains  of only the following symptoms, and all other reviewed systems are negative.   Epworth score   , Fatigue severity score 24  , depression score 5/15    Social History   Social History  . Marital status: Married    Spouse name: N/A  . Number of children: N/A  . Years of education: N/A   Occupational History  . Not on file.   Social History Main Topics  . Smoking status: Former Research scientist (life sciences)  . Smokeless tobacco: Never Used     Comment: quit smoking in 1992  . Alcohol use 0.0 oz/week     Comment: occ  . Drug use: No  . Sexual activity: Yes    Birth control/ protection: None   Other Topics Concern  . Not on file   Social History Narrative     ** Merged History Encounter **    Drinks about 3 caffeine drinks a week     Family History  Problem Relation Age of Onset  . Hypertension Father   . Cancer Father     colon, liver  . Hypertension Sister   . Hypertension Brother   . Cancer Brother     lung, adrenal glands and brain  . Hypertension Brother     Past Medical History:  Diagnosis Date  . Anxiety   . Arthritis   . DDD (degenerative disc disease), lumbar   . Depression   . Diabetes mellitus 1-13   patient denies and says never made aware;  BORDERLINE DR. BALAN-diet controlled;   Marland Kitchen GERD (gastroesophageal reflux disease)   . Headache    migraines  . Hepatitis    genotype 1b hepatitis C virus, monitored by [facility] infectious disease group  . History of bronchitis   . Hypertension   . Spinal cord stimulator status    placed 2006  . Thyroid disease    takes synthroid    Past Surgical History:  Procedure Laterality Date  . Anterior cervical decompression and fusion with a reflex hyper plate  [id number]   [id number] and C5-C6 , Dr. Maia Plan  . BREAST SURGERY  1983   left breast cyst removed  . cervical fusion w/OASIS system infuse  05/13/2006   C4-C5 and C5-C6 and C6-7,  Dr. Maia Plan  . CHOLECYSTECTOMY  ?2002  . CHOLECYSTECTOMY  2002  . COLONOSCOPY    . ESOPHAGEAL DILATION     Dr. Amedeo Plenty  . LAPAROSCOPIC ENDOMETRIOSIS FULGURATION  1980's   Dr. Warnell Forester  . LIVER BIOPSY  2002  . LOWER BACK SURGERY  OCT 2016  . LUMBAR DISC SURGERY  08/13/2003   Diskectomy/fusion L5-S1 rt., Dr. Maia Plan  . lumbar hemilaminectomy  01/1989   L5-S1 on right, Dr. Ardeen Jourdain  . NASAL SINUS SURGERY  10/30/2007   Left upper Sinus lift,  Dr. Mingo Amber  . SHOULDER ARTHROSCOPY WITH SUBACROMIAL DECOMPRESSION Right 12/06/2014   Procedure: SHOULDER ARTHROSCOPY WITH SUBACROMIAL DECOMPRESSION; EXCISION BONE SPUR;  Surgeon: Roseanne Kaufman, MD;  Location: [place];  Service: Orthopedics;  Laterality: Right;  . SHOULDER SURGERY Right 12/06/14    "cleaned up bone spurs"  . SHOULDER SURGERY Right FEB 2016  . SPINAL CORD STIMULATOR INSERTION  12/29/2004   leads placed along spine from T8 to T10, Dr. Maia Plan  . Spinal cord stimulator pulse generator  01/05/2005   placed in right upper buttocks, Dr. Maia Plan  . THYROID LOBECTOMY Right 02/13/2016  . THYROIDECTOMY Right 02/13/2016   Procedure: RIGHT THYROID LOBECTOMY WITH FROZEN  SECTION ;  Surgeon: Izora Gala, MD;  Location: [facility];  Service: ENT;  Laterality: Right;  . TONSILLECTOMY      Current Outpatient Prescriptions  Medication Sig Dispense Refill  . FLUoxetine (PROZAC) 40 MG capsule Take 40 mg by mouth daily.     . fluticasone (FLONASE) 50 MCG/ACT nasal spray Place 2 sprays into the nose daily as needed for allergies.     Marland Kitchen gabapentin (NEURONTIN) 300 MG capsule     . levothyroxine (SYNTHROID, LEVOTHROID) 112 MCG tablet TAKE 1 TABLET BY MOUTH EVERY DAY    . lisinopril-hydrochlorothiazide (PRINZIDE,ZESTORETIC) 20-12.5 MG per tablet Take 1 tablet by mouth every morning.     . meloxicam (MOBIC) 7.5 MG tablet Take 7.5 mg by mouth daily as needed for pain.     . NONFORMULARY OR COMPOUNDED ITEM Estradiol 0.02 % 20ml prefilled applicator Sig: apply twice a week 24 each 4  . omeprazole (PRILOSEC) 20 MG capsule Take 20 mg by mouth every 12 (twelve) hours.     . polyethylene glycol (MIRALAX / GLYCOLAX) packet Take 17 g by mouth daily as needed for mild constipation or moderate constipation. 14 each 1  . pravastatin (PRAVACHOL) 20 MG tablet Take 20 mg by mouth at bedtime.     Marland Kitchen Propylene Glycol (SYSTANE BALANCE) 0.6 % SOLN Apply to eye.    . rizatriptan (MAXALT) 10 MG tablet Take 10 mg by mouth as needed for migraine. May repeat in 2 hours if needed    . traMADol (ULTRAM) 50 MG tablet Take 1 tablet (50 mg total) by mouth every 6 (six) hours as needed. 15 tablet 0  . traZODone (DESYREL) 50 MG tablet Take 50 mg by mouth at bedtime as needed for sleep.      No current facility-administered  medications for this visit.     Allergies as of 09/27/2016 - Review Complete 09/27/2016  Allergen Reaction Noted  . Duloxetine Other (See Comments) 11/15/2006  . Acetic acid-oxyquinoline Other (See Comments) 02/11/2016  . Buprenorphine hcl Itching, Nausea Only, and Rash 02/11/2016  . Codeine Nausea Only 02/11/2016  . Imitrex [sumatriptan] Hives 12/13/2014  . Morphine Rash 02/11/2016  . Morphine and related Itching, Nausea Only, and Rash 09/17/2011  . Pentazocine Other (See Comments) 02/11/2016  . Pentazocine lactate Other (See Comments) 03/26/2008  . Sumatriptan succinate Hives 02/11/2016    Vitals: BP 104/62   Pulse 72   Resp 16   Ht 5\' 7"  (1.702 m)   Wt 180 lb (81.6 kg)   LMP 06/24/2012   BMI 28.19 kg/m  Last Weight:  Wt Readings from Last 1 Encounters:  09/27/16 180 lb (81.6 kg)   [id number] mass index is 28.19 kg/m.     Last Height:   Ht Readings from Last 1 Encounters:  09/27/16 5\' 7"  (1.702 m)    Physical exam:  General: The patient is awake, alert and appears not in acute distress. The patient is well groomed. Head: Normocephalic, atraumatic. Neck is supple. Mallampati 3  neck circumference: 13. 5, anterior fusion scar. . Nasal airflow patent , TMJ is not evident . Retrognathia is not seen.  No bruxism marks. No tongue bite.  Cardiovascular:  Regular rate and rhythm , without  murmurs or carotid bruit, and without distended neck veins. Respiratory: Lungs are clear to auscultation. Skin:  Without evidence of edema, or rash Trunk: BMI is 28.  Neurologic exam : The patient is awake and alert, oriented to place and time.   Memory subjective described as  intact.  Attention span & concentration ability appears normal.  Speech is fluent,  without dysarthria, dysphonia or aphasia.  Mood and affect are appropriate.  Cranial nerves: Pupils are equal and briskly reactive to light. Funduscopic exam without  evidence of pallor or edema.  Extraocular movements  in vertical  and horizontal planes intact and without nystagmus. Visual fields by finger perimetry are intact. Hearing to finger rub intact. Facial sensation intact to fine touch. Facial motor strength is symmetric and tongue and uvula move midline. Shoulder shrug was symmetrical.  Motor exam:   Normal tone, muscle bulk and symmetric strength in all extremities. Sensory:  Fine touch, pinprick and vibration were tested in all extremities.  Coordination: Rapid alternating movements in the fingers/hands normal ,  Finger-to-nose maneuver  normal without evidence of ataxia, dysmetria or tremor. Gait and station: Patient walks without assistive device and is able unassisted to climb up to the exam table. Strength within normal limits. Stance is stable and normal.  Tandem gait deferred, turns are fragmented. Turns with 4-5    Steps. Romberg testing is negative. Deep tendon reflexes: in the upper and lower extremities are symmetric and intact. Babinski maneuver response is downgoing.   Thyroiditis, CKD stage 3, hypercholesterolemia, HTN with chronic kidney disease, albuminuria, anxiety and major depression, IBS, rhinitis, hypothyroidism. DJD.   Reviewed sleep study from 2009-  The patient's last sleep study with [place] took place on 11/28/2007, she had an AHI of only 3.9 in supine accentuated to 7.6, oxygen nadir was 91% AND clinically insignificant. Her heart rate varied it did not constitute brachy- tachycardia arrhythmia, valid study for MSLT to follow, M SLT documented a mean sleep latency of only 7.4 minutes but not REM sleep. The diagnosis was idiopathic hypersomnia.  The patient was advised of the nature of the diagnosed sleep disorder , the treatment options and risks for general a health and wellness arising from not treating the condition.  I spent more than 45  minutes of face to face time with the patient. Greater than 50% of time was spent in counseling and coordination of care. We have discussed the diagnosis  and differential and I answered the patient's questions.     Assessment:  After physical and neurologic examination, review of laboratory studies,  Personal review of imaging studies, reports of other /same  Imaging studies ,  Results of polysomnography/ neurophysiology testing and pre-existing records as far as provided in visit., my assessment is   1)  I will invite [name] for a sleep study to see if she has developed obstructive sleep apnea along with the now observed snoring. Since she takes pain medication and muscle muscle relaxant medication she is a higher risk now. Narcotic pain medications can induce central apneas as well. She is also on antidepressant medication which is necessary to help with pain control, but it will change the sleep architecture. It may not be possible to observe any REM sleep or dream sleep at all. The sleep study will be scheduled as a split-night polysomnography. She will change insurance companies at the beginning of the year, and is currently with [place] healthcare. I will try to get a in lab sleep study scheduled before December 31.   Plan:  Treatment plan and additional workup :  SPLIT order, watch for central apnea. Narcotic, muccle relaxants and antidepressant use.    Asencion Partridge Jenesis Martin MD  09/27/2016   CC: Haywood Pao, [address] [place], [place] [address]

## 2016-10-27 ENCOUNTER — Telehealth: Payer: Self-pay | Admitting: Neurology

## 2016-10-27 DIAGNOSIS — R0683 Snoring: Secondary | ICD-10-CM

## 2016-10-27 NOTE — Telephone Encounter (Signed)
[employer] denied Split.  Can I get an order for HST?

## 2016-10-28 NOTE — Telephone Encounter (Signed)
[employer] denied split study- HST will be ordered.

## 2016-10-28 NOTE — Addendum Note (Signed)
Addended by: Larey Seat on: 10/28/2016 05:37 PM   Modules accepted: Orders

## 2016-11-10 ENCOUNTER — Ambulatory Visit (INDEPENDENT_AMBULATORY_CARE_PROVIDER_SITE_OTHER): Payer: Medicare HMO | Admitting: Neurology

## 2016-11-10 DIAGNOSIS — G4710 Hypersomnia, unspecified: Secondary | ICD-10-CM | POA: Diagnosis not present

## 2016-11-10 DIAGNOSIS — G4731 Primary central sleep apnea: Secondary | ICD-10-CM

## 2016-11-10 DIAGNOSIS — R0683 Snoring: Secondary | ICD-10-CM

## 2016-11-12 ENCOUNTER — Telehealth: Payer: Self-pay | Admitting: Neurology

## 2016-11-12 DIAGNOSIS — Z79899 Other long term (current) drug therapy: Secondary | ICD-10-CM

## 2016-11-12 DIAGNOSIS — G4750 Parasomnia, unspecified: Secondary | ICD-10-CM

## 2016-11-12 DIAGNOSIS — Z79891 Long term (current) use of opiate analgesic: Secondary | ICD-10-CM

## 2016-11-12 DIAGNOSIS — G4761 Periodic limb movement disorder: Secondary | ICD-10-CM

## 2016-11-12 DIAGNOSIS — Z9689 Presence of other specified functional implants: Secondary | ICD-10-CM

## 2016-11-12 NOTE — Procedures (Signed)
[facility] [address] [place], [place] [address] NAME: [name] DOB: 1956/02/25 MEDICAL RECORD [id number] DOS:  11/10/16 REFERRING PHYSICIAN: Domenick Gong, MD  STUDY PERFORMED:  HST/ Out of Center Sleep Test  HISTORY: [name] is a married  61 y.o female has insomnia, excessive daytime sleepiness, fatigue , snoring .She sleeps only in supine position, her  husband is not sure if he has witnessed any apneas- she just snores loudly. She struggles to go to sleep and stay asleep.  She is talking in her sleep, feeling groggy in AM. She is on multiple pain medications daily. Husband witnessed her to be restless at night. Failed trazodone- too groggy next day.  Hx: OSA , mild , diagnosed in 2009, cervical fusion 2006 and 2007. Lumbar discectomy, spinal cord stimulator, PLM/RLS. Parasomnia. Chronic pain. Chronic use of narcotic /opiate pain medication.      STUDY RESULTS: Total Recording Time:  9h 13m Total Apnea/Hypopnea Index (AHI) 3.7/ hr Average Oxygen Desaturation: SpO2  94% Lowest Oxygen desaturation: SpO2 nadir at 88%, Total desaturation time was 0.0 minutes ,  Average  Pulse: 68 bpm, NSR- no tachy or brady arrhythmia    IMPRESSION: This HST is negative for apnea.  But HST cannot evaluate parasomnia activity, RLS, PLMs and are not able to characterize REM dependent sleep activities. A sleep perception discrepancy is also not identifiable.   RECOMMENDATION: Snoring was identified as RDI was 5.8- in the absence of oxygen desaturation and severe apnea, this could be treated by a dental device. Parasomnia montage PSG is strongly recommended. The patient takes narcotic pain medication, SSRI, muscle relaxants, Gabapentin and has PLMs. She is not apneic. We need to evaluate her sleep with an expanded EEG montage, video and audio.     I certify that I have reviewed the raw data recording prior to the issuance of this report in accordance with  the standards of Accreditation of the [place] [employer] (AASM) Larey Seat, MD   11-12-2016 , Diplomat, American Board of Psychiatry and Neurology Diplomat, [place] of [employer],  Medical Director of  [employer] [facility] [place]

## 2016-11-12 NOTE — Telephone Encounter (Signed)
STUDY RESULTS: Total Recording Time:  9h 6m Total Apnea/Hypopnea Index (AHI) 3.7/ hr Average Oxygen Desaturation: SpO2  94% Lowest Oxygen desaturation: SpO2 nadir at 88%, Total desaturation time was 0.0 minutes ,  Average  Pulse: 68 bpm, NSR- no tachy or brady arrhythmia    IMPRESSION: This HST is negative for apnea.  But HST cannot evaluate parasomnia activity, RLS, PLMs and are not able to characterize REM dependent sleep activities. A sleep perception discrepancy is also not identifiable.   RECOMMENDATION: Snoring was identified as RDI was 5.8- in the absence of oxygen desaturation and severe apnea, this could be treated by a dental device. Parasomnia montage PSG is strongly recommended. The patient takes narcotic pain medication, SSRI, muscle relaxants, Gabapentin and has parasomnia with vocalization, myoclonic jerks and PLMs. She is not apneic. We need to evaluate her sleep with an expanded EEG montage, video and audio.

## 2016-11-15 NOTE — Telephone Encounter (Signed)
I called pt to discuss sleep study results. No answer, left a message asking her to call me back. 

## 2016-11-15 NOTE — Telephone Encounter (Signed)
I called pt. I advised her that her sleep study was negative for apnea but an HST cannot evaluate for parasomnia activity, RLS, PLMS, and cannot capture REM dependent sleep activities. I advised pt that she did snore during her sleep study with the absence of oxygen desaturation and severe apnea, and this can be treated by a dental device. Dr. Brett Fairy strongly recommends a parasomnia montage. Pt is agreeable to a PSG and I advised her that we will let her know if this is approved by her insurance. Pt verbalized understanding of results. Pt asked that I sent these results to Dr. Osborne Casco. Pt had no questions at this time but was encouraged to call back if questions arise.

## 2016-11-15 NOTE — Telephone Encounter (Signed)
-----   Message from Larey Seat, MD sent at 11/15/2016  8:15 AM EST ----- See above

## 2016-11-22 DIAGNOSIS — Z6829 Body mass index (BMI) 29.0-29.9, adult: Secondary | ICD-10-CM | POA: Diagnosis not present

## 2016-11-22 DIAGNOSIS — J029 Acute pharyngitis, unspecified: Secondary | ICD-10-CM | POA: Diagnosis not present

## 2016-11-22 DIAGNOSIS — H9201 Otalgia, right ear: Secondary | ICD-10-CM | POA: Diagnosis not present

## 2016-12-06 DIAGNOSIS — E039 Hypothyroidism, unspecified: Secondary | ICD-10-CM | POA: Diagnosis not present

## 2016-12-13 DIAGNOSIS — I10 Essential (primary) hypertension: Secondary | ICD-10-CM | POA: Diagnosis not present

## 2016-12-13 DIAGNOSIS — E039 Hypothyroidism, unspecified: Secondary | ICD-10-CM | POA: Diagnosis not present

## 2016-12-13 DIAGNOSIS — E049 Nontoxic goiter, unspecified: Secondary | ICD-10-CM | POA: Diagnosis not present

## 2016-12-13 DIAGNOSIS — R7301 Impaired fasting glucose: Secondary | ICD-10-CM | POA: Diagnosis not present

## 2016-12-23 DIAGNOSIS — D3612 Benign neoplasm of peripheral nerves and autonomic nervous system, upper limb, including shoulder: Secondary | ICD-10-CM | POA: Diagnosis not present

## 2016-12-31 ENCOUNTER — Other Ambulatory Visit: Payer: Self-pay | Admitting: Women's Health

## 2016-12-31 DIAGNOSIS — Z1231 Encounter for screening mammogram for malignant neoplasm of breast: Secondary | ICD-10-CM

## 2017-01-19 DIAGNOSIS — M47816 Spondylosis without myelopathy or radiculopathy, lumbar region: Secondary | ICD-10-CM | POA: Diagnosis not present

## 2017-01-19 DIAGNOSIS — M4302 Spondylolysis, cervical region: Secondary | ICD-10-CM | POA: Diagnosis not present

## 2017-01-19 DIAGNOSIS — M5105 Intervertebral disc disorders with myelopathy, thoracolumbar region: Secondary | ICD-10-CM | POA: Diagnosis not present

## 2017-01-20 ENCOUNTER — Ambulatory Visit: Payer: Medicare Other

## 2017-02-09 DIAGNOSIS — M79672 Pain in left foot: Secondary | ICD-10-CM | POA: Diagnosis not present

## 2017-02-09 DIAGNOSIS — M7672 Peroneal tendinitis, left leg: Secondary | ICD-10-CM | POA: Diagnosis not present

## 2017-02-09 DIAGNOSIS — G8929 Other chronic pain: Secondary | ICD-10-CM | POA: Diagnosis not present

## 2017-02-09 DIAGNOSIS — M216X2 Other acquired deformities of left foot: Secondary | ICD-10-CM | POA: Diagnosis not present

## 2017-02-14 ENCOUNTER — Ambulatory Visit: Payer: Medicare Other

## 2017-02-24 DIAGNOSIS — E7800 Pure hypercholesterolemia, unspecified: Secondary | ICD-10-CM | POA: Diagnosis not present

## 2017-02-24 DIAGNOSIS — E038 Other specified hypothyroidism: Secondary | ICD-10-CM | POA: Diagnosis not present

## 2017-02-24 DIAGNOSIS — R8299 Other abnormal findings in urine: Secondary | ICD-10-CM | POA: Diagnosis not present

## 2017-02-24 DIAGNOSIS — R358 Other polyuria: Secondary | ICD-10-CM | POA: Diagnosis not present

## 2017-02-24 DIAGNOSIS — M859 Disorder of bone density and structure, unspecified: Secondary | ICD-10-CM | POA: Diagnosis not present

## 2017-02-25 ENCOUNTER — Ambulatory Visit
Admission: RE | Admit: 2017-02-25 | Discharge: 2017-02-25 | Disposition: A | Discharge status: Home / Self Care | Payer: Medicare HMO | Source: Ambulatory Visit | Attending: Women's Health | Admitting: Women's Health

## 2017-02-25 DIAGNOSIS — Z1231 Encounter for screening mammogram for malignant neoplasm of breast: Secondary | ICD-10-CM | POA: Diagnosis not present

## 2017-03-02 ENCOUNTER — Encounter: Payer: Self-pay | Admitting: Gynecology

## 2017-03-03 DIAGNOSIS — I120 Hypertensive chronic kidney disease with stage 5 chronic kidney disease or end stage renal disease: Secondary | ICD-10-CM | POA: Diagnosis not present

## 2017-03-03 DIAGNOSIS — Z0000 Encounter for general adult medical examination without abnormal findings: Secondary | ICD-10-CM | POA: Diagnosis not present

## 2017-03-03 DIAGNOSIS — E038 Other specified hypothyroidism: Secondary | ICD-10-CM | POA: Diagnosis not present

## 2017-03-03 DIAGNOSIS — M859 Disorder of bone density and structure, unspecified: Secondary | ICD-10-CM | POA: Diagnosis not present

## 2017-03-03 DIAGNOSIS — N183 Chronic kidney disease, stage 3 (moderate): Secondary | ICD-10-CM | POA: Diagnosis not present

## 2017-03-03 DIAGNOSIS — E7800 Pure hypercholesterolemia, unspecified: Secondary | ICD-10-CM | POA: Diagnosis not present

## 2017-03-03 DIAGNOSIS — E063 Autoimmune thyroiditis: Secondary | ICD-10-CM | POA: Diagnosis not present

## 2017-03-03 DIAGNOSIS — R808 Other proteinuria: Secondary | ICD-10-CM | POA: Diagnosis not present

## 2017-03-03 DIAGNOSIS — Z23 Encounter for immunization: Secondary | ICD-10-CM | POA: Diagnosis not present

## 2017-03-03 DIAGNOSIS — R69 Illness, unspecified: Secondary | ICD-10-CM | POA: Diagnosis not present

## 2017-03-07 ENCOUNTER — Ambulatory Visit (INDEPENDENT_AMBULATORY_CARE_PROVIDER_SITE_OTHER): Payer: Medicare HMO | Admitting: Women's Health

## 2017-03-07 ENCOUNTER — Encounter: Payer: Self-pay | Admitting: Women's Health

## 2017-03-07 VITALS — BP 118/74 | Ht 67.0 in | Wt 180.0 lb

## 2017-03-07 DIAGNOSIS — N949 Unspecified condition associated with female genital organs and menstrual cycle: Secondary | ICD-10-CM

## 2017-03-07 LAB — URINALYSIS W MICROSCOPIC + REFLEX CULTURE
Bilirubin Urine: NEGATIVE
CASTS: NONE SEEN [LPF]
CRYSTALS: NONE SEEN [HPF]
Glucose, UA: NEGATIVE
Hgb urine dipstick: NEGATIVE
Ketones, ur: NEGATIVE
Nitrite: NEGATIVE
PROTEIN: NEGATIVE
RBC / HPF: NONE SEEN RBC/HPF (ref <=2)
Specific Gravity, Urine: <1.005 (ref 1.001–1.035)
YEAST: NONE SEEN [HPF]
pH: 6 (ref 5.0–8.0)

## 2017-03-07 LAB — WET PREP FOR TRICH, YEAST, CLUE
CLUE CELLS WET PREP: NONE SEEN
Trich, Wet Prep: NONE SEEN
YEAST WET PREP: NONE SEEN

## 2017-03-07 MED ORDER — NONFORMULARY OR COMPOUNDED ITEM: 4 refills | Status: DC

## 2017-03-07 NOTE — Patient Instructions (Signed)
Atrophic Vaginitis Atrophic vaginitis is a condition in which the tissues that line the vagina become dry and thin. This condition is most common in women who have stopped having regular menstrual periods (menopause). This usually starts when a woman is 55-61 years old. Estrogen helps to keep the vagina moist. It stimulates the vagina to produce a clear fluid that lubricates the vagina for sexual intercourse. This fluid also protects the vagina from infection. Lack of estrogen can cause the lining of the vagina to get thinner and dryer. The vagina may also shrink in size. It may become less elastic. Atrophic vaginitis tends to get worse over time as a woman's estrogen level drops. What are the causes? This condition is caused by the normal drop in estrogen that happens around the time of menopause. What increases the risk? Certain conditions or situations may lower a woman's estrogen level, which increases her risk of atrophic vaginitis. These include:  Taking medicine that blocks estrogen.  Having ovaries removed surgically.  Being treated for cancer with X-ray treatment (radiation) or medicines (chemotherapy).  Exercising very hard and often.  Having an eating disorder (anorexia).  Giving birth or breastfeeding.  Being over the age of 47.  Smoking. What are the signs or symptoms? Symptoms of this condition include:  Pain, soreness, or bleeding during sexual intercourse (dyspareunia).  Vaginal burning, irritation, or itching.  Pain or bleeding during a vaginal examination using a speculum (pelvic exam).  Loss of interest in sexual activity.  Having burning pain when passing urine.  Vaginal discharge that is brown or yellow. In some cases, there are no symptoms. How is this diagnosed? This condition is diagnosed with a medical history and physical exam. This will include a pelvic exam that checks whether the inside of your vagina appears pale, thin, or dry. Rarely, you may also  have other tests, including:  A urine test.  A test that checks the acid balance in your vaginal fluid (acid balance test). How is this treated? Treatment for this condition may depend on the severity of your symptoms. Treatment may include:  Using an over-the-counter vaginal lubricant before you have sexual intercourse.  Using a long-acting vaginal moisturizer.  Using low-dose vaginal estrogen for moderate to severe symptoms that do not respond to other treatments. Options include creams, tablets, and inserts (vaginal rings). Before using vaginal estrogen, tell your health care provider if you have a history of:  Breast cancer.  Endometrial cancer.  Blood clots.  Taking medicines. You may be able to take a daily pill for dyspareunia. Discuss all of the risks of this medicine with your health care provider. It is usually not recommended for women who have a family history or personal history of breast cancer. If your symptoms are very mild and you are not sexually active, you may not need treatment. Follow these instructions at home:  Take medicines only as directed by your health care provider. Do not use herbal or alternative medicines unless your health care provider says that you can.  Use over-the-counter creams, lubricants, or moisturizers for dryness only as directed by your health care provider.  If your atrophic vaginitis is caused by menopause, discuss all of your menopausal symptoms and treatment options with your health care provider.  Do not douche.  Do not use products that can make your vagina dry. These include:  Scented feminine sprays.  Scented tampons.  Scented soaps.  If it hurts to have sex, talk with your sexual partner. Contact a health  care provider if:  Your discharge looks different than normal.  Your vagina has an unusual smell.  You have new symptoms.  Your symptoms do not improve with treatment.  Your symptoms get worse. This  information is not intended to replace advice given to you by your health care provider. Make sure you discuss any questions you have with your health care provider. Document Released: 02/18/2015 Document Revised: 03/11/2016 Document Reviewed: 09/25/2014 Elsevier Interactive Patient Education  2017 [employer].

## 2017-03-07 NOTE — Progress Notes (Signed)
Presents with complaint of vaginal burning/irritation for the past week that has increased. Has a continual burning sensation, burning with urination with no change throughout stream and vaginal discomfort. Using estradiol vaginal cream twice weekly. Denies abdominal or back pain, fever, urinary frequency, vaginal itching or visible discharge. States no comfortable positions, open to air somewhat helpful. Has had problems with vaginal burning in the past. No recent intercourse or change in routine.  Exam: Appears well. External genitalia extremely erythematous at introitus, wet prep done with a Q-tip. Wet prep negative. UA: Trace leukocytes, i0-5 WBCs, few bacteria  Atrophic vaginitis   Plan: Will continue vaginal estrogen cream twice weekly, apply small amount externally daily, continue to keep open to air, dry, loose clothing as best able. Urine culture pending. Instructed to call if continued problems.

## 2017-03-09 LAB — URINE CULTURE: ORGANISM ID, BACTERIA: NO GROWTH

## 2017-03-18 DIAGNOSIS — Z1212 Encounter for screening for malignant neoplasm of rectum: Secondary | ICD-10-CM | POA: Diagnosis not present

## 2017-03-23 ENCOUNTER — Ambulatory Visit (INDEPENDENT_AMBULATORY_CARE_PROVIDER_SITE_OTHER): Payer: Medicare HMO | Admitting: Women's Health

## 2017-03-23 ENCOUNTER — Encounter: Payer: Self-pay | Admitting: Women's Health

## 2017-03-23 VITALS — BP 110/78 | Ht 67.0 in | Wt 178.0 lb

## 2017-03-23 DIAGNOSIS — Z01411 Encounter for gynecological examination (general) (routine) with abnormal findings: Secondary | ICD-10-CM

## 2017-03-23 DIAGNOSIS — N898 Other specified noninflammatory disorders of vagina: Secondary | ICD-10-CM

## 2017-03-23 LAB — WET PREP FOR TRICH, YEAST, CLUE
CLUE CELLS WET PREP: NONE SEEN
TRICH WET PREP: NONE SEEN
WBC WET PREP: NONE SEEN
YEAST WET PREP: NONE SEEN

## 2017-03-23 NOTE — Patient Instructions (Signed)
Atrophic Vaginitis  Atrophic vaginitis is a condition in which the tissues that line the vagina become dry and thin. This condition is most common in women who have stopped having regular menstrual periods (menopause). This usually starts when a woman is 45-61 years old.  Estrogen helps to keep the vagina moist. It stimulates the vagina to produce a clear fluid that lubricates the vagina for sexual intercourse. This fluid also protects the vagina from infection. Lack of estrogen can cause the lining of the vagina to get thinner and dryer. The vagina may also shrink in size. It may become less elastic. Atrophic vaginitis tends to get worse over time as a woman's estrogen level drops.  What are the causes?  This condition is caused by the normal drop in estrogen that happens around the time of menopause.  What increases the risk?  Certain conditions or situations may lower a woman's estrogen level, which increases her risk of atrophic vaginitis. These include:   Taking medicine that blocks estrogen.   Having ovaries removed surgically.   Being treated for cancer with X-ray treatment (radiation) or medicines (chemotherapy).   Exercising very hard and often.   Having an eating disorder (anorexia).   Giving birth or breastfeeding.   Being over the age of 50.   Smoking.    What are the signs or symptoms?  Symptoms of this condition include:   Pain, soreness, or bleeding during sexual intercourse (dyspareunia).   Vaginal burning, irritation, or itching.   Pain or bleeding during a vaginal examination using a speculum (pelvic exam).   Loss of interest in sexual activity.   Having burning pain when passing urine.   Vaginal discharge that is brown or yellow.    In some cases, there are no symptoms.  How is this diagnosed?  This condition is diagnosed with a medical history and physical exam. This will include a pelvic exam that checks whether the inside of your vagina appears pale, thin, or dry. Rarely, you may  also have other tests, including:   A urine test.   A test that checks the acid balance in your vaginal fluid (acid balance test).    How is this treated?  Treatment for this condition may depend on the severity of your symptoms. Treatment may include:   Using an over-the-counter vaginal lubricant before you have sexual intercourse.   Using a long-acting vaginal moisturizer.   Using low-dose vaginal estrogen for moderate to severe symptoms that do not respond to other treatments. Options include creams, tablets, and inserts (vaginal rings). Before using vaginal estrogen, tell your health care provider if you have a history of:  ? Breast cancer.  ? Endometrial cancer.  ? Blood clots.   Taking medicines. You may be able to take a daily pill for dyspareunia. Discuss all of the risks of this medicine with your health care provider. It is usually not recommended for women who have a family history or personal history of breast cancer.    If your symptoms are very mild and you are not sexually active, you may not need treatment.  Follow these instructions at home:   Take medicines only as directed by your health care provider. Do not use herbal or alternative medicines unless your health care provider says that you can.   Use over-the-counter creams, lubricants, or moisturizers for dryness only as directed by your health care provider.   If your atrophic vaginitis is caused by menopause, discuss all of your menopausal symptoms and   treatment options with your health care provider.   Do not douche.   Do not use products that can make your vagina dry. These include:  ? Scented feminine sprays.  ? Scented tampons.  ? Scented soaps.   If it hurts to have sex, talk with your sexual partner.  Contact a health care provider if:   Your discharge looks different than normal.   Your vagina has an unusual smell.   You have new symptoms.   Your symptoms do not improve with treatment.   Your symptoms get worse.  This  information is not intended to replace advice given to you by your health care provider. Make sure you discuss any questions you have with your health care provider.  Document Released: 02/18/2015 Document Revised: 03/11/2016 Document Reviewed: 09/25/2014  Elsevier Interactive Patient Education  2018 [employer]    Health Maintenance for Postmenopausal Women  Menopause is a normal process in which your reproductive ability comes to an end. This process happens gradually over a span of months to years, usually between the ages of 48 and 55. Menopause is complete when you have missed 12 consecutive menstrual periods.  It is important to talk with your health care provider about some of the most common conditions that affect postmenopausal women, such as heart disease, cancer, and bone loss (osteoporosis). Adopting a healthy lifestyle and getting preventive care can help to promote your health and wellness. Those actions can also lower your chances of developing some of these common conditions.  What should I know about menopause?  During menopause, you may experience a number of symptoms, such as:   Moderate-to-severe hot flashes.   Night sweats.   Decrease in sex drive.   Mood swings.   Headaches.   Tiredness.   Irritability.   Memory problems.   Insomnia.    Choosing to treat or not to treat menopausal changes is an individual decision that you make with your health care provider.  What should I know about hormone replacement therapy and supplements?  Hormone therapy products are effective for treating symptoms that are associated with menopause, such as hot flashes and night sweats. Hormone replacement carries certain risks, especially as you become older. If you are thinking about using estrogen or estrogen with progestin treatments, discuss the benefits and risks with your health care provider.  What should I know about heart disease and stroke?  Heart disease, heart attack, and stroke become more  likely as you age. This may be due, in part, to the hormonal changes that your body experiences during menopause. These can affect how your body processes dietary fats, triglycerides, and cholesterol. Heart attack and stroke are both medical emergencies.  There are many things that you can do to help prevent heart disease and stroke:   Have your blood pressure checked at least every 1-2 years. High blood pressure causes heart disease and increases the risk of stroke.   If you are 55-79 years old, ask your health care provider if you should take aspirin to prevent a heart attack or a stroke.   Do not use any tobacco products, including cigarettes, chewing tobacco, or electronic cigarettes. If you need help quitting, ask your health care provider.   It is important to eat a healthy diet and maintain a healthy weight.  ? Be sure to include plenty of vegetables, fruits, low-fat dairy products, and lean protein.  ? Avoid eating foods that are high in solid fats, added sugars, or salt (sodium).     Get regular exercise. This is one of the most important things that you can do for your health.  ? Try to exercise for at least 150 minutes each week. The type of exercise that you do should increase your heart rate and make you sweat. This is known as moderate-intensity exercise.  ? Try to do strengthening exercises at least twice each week. Do these in addition to the moderate-intensity exercise.   Know your numbers.Ask your health care provider to check your cholesterol and your blood glucose. Continue to have your blood tested as directed by your health care provider.    What should I know about cancer screening?  There are several types of cancer. Take the following steps to reduce your risk and to catch any cancer development as early as possible.  Breast Cancer   Practice breast self-awareness.  ? This means understanding how your breasts normally appear and feel.  ? It also means doing regular breast self-exams.  Let your health care provider know about any changes, no matter how small.   If you are 40 or older, have a clinician do a breast exam (clinical breast exam or CBE) every year. Depending on your age, family history, and medical history, it may be recommended that you also have a yearly breast X-ray (mammogram).   If you have a family history of breast cancer, talk with your health care provider about genetic screening.   If you are at high risk for breast cancer, talk with your health care provider about having an MRI and a mammogram every year.   Breast cancer (BRCA) gene test is recommended for women who have family members with BRCA-related cancers. Results of the assessment will determine the need for genetic counseling and BRCA1 and for BRCA2 testing. BRCA-related cancers include these types:  ? Breast. This occurs in males or females.  ? Ovarian.  ? Tubal. This may also be called fallopian tube cancer.  ? Cancer of the abdominal or pelvic lining (peritoneal cancer).  ? Prostate.  ? Pancreatic.    Cervical, Uterine, and Ovarian Cancer  Your health care provider may recommend that you be screened regularly for cancer of the pelvic organs. These include your ovaries, uterus, and vagina. This screening involves a pelvic exam, which includes checking for microscopic changes to the surface of your cervix (Pap test).   For women ages 21-65, health care providers may recommend a pelvic exam and a Pap test every three years. For women ages 30-65, they may recommend the Pap test and pelvic exam, combined with testing for human papilloma virus (HPV), every five years. Some types of HPV increase your risk of cervical cancer. Testing for HPV may also be done on women of any age who have unclear Pap test results.   Other health care providers may not recommend any screening for nonpregnant women who are considered low risk for pelvic cancer and have no symptoms. Ask your health care provider if a screening pelvic exam  is right for you.   If you have had past treatment for cervical cancer or a condition that could lead to cancer, you need Pap tests and screening for cancer for at least 20 years after your treatment. If Pap tests have been discontinued for you, your risk factors (such as having a new sexual partner) need to be reassessed to determine if you should start having screenings again. Some women have medical problems that increase the chance of getting cervical cancer. In these cases, your   health care provider may recommend that you have screening and Pap tests more often.   If you have a family history of uterine cancer or ovarian cancer, talk with your health care provider about genetic screening.   If you have vaginal bleeding after reaching menopause, tell your health care provider.   There are currently no reliable tests available to screen for ovarian cancer.    Lung Cancer  Lung cancer screening is recommended for adults 55-80 years old who are at high risk for lung cancer because of a history of smoking. A yearly low-dose CT scan of the lungs is recommended if you:   Currently smoke.   Have a history of at least 30 pack-years of smoking and you currently smoke or have quit within the past 15 years. A pack-year is smoking an average of one pack of cigarettes per day for one year.    Yearly screening should:   Continue until it has been 15 years since you quit.   Stop if you develop a health problem that would prevent you from having lung cancer treatment.    Colorectal Cancer   This type of cancer can be detected and can often be prevented.   Routine colorectal cancer screening usually begins at age 50 and continues through age 75.   If you have risk factors for colon cancer, your health care provider may recommend that you be screened at an earlier age.   If you have a family history of colorectal cancer, talk with your health care provider about genetic screening.   Your health care provider may also  recommend using home test kits to check for hidden blood in your stool.   A small camera at the end of a tube can be used to examine your colon directly (sigmoidoscopy or colonoscopy). This is done to check for the earliest forms of colorectal cancer.   Direct examination of the colon should be repeated every 5-10 years until age 75. However, if early forms of precancerous polyps or small growths are found or if you have a family history or genetic risk for colorectal cancer, you may need to be screened more often.    Skin Cancer   Check your skin from head to toe regularly.   Monitor any moles. Be sure to tell your health care provider:  ? About any new moles or changes in moles, especially if there is a change in a mole's shape or color.  ? If you have a mole that is larger than the size of a pencil eraser.   If any of your family members has a history of skin cancer, especially at a Adalyna Godbee age, talk with your health care provider about genetic screening.   Always use sunscreen. Apply sunscreen liberally and repeatedly throughout the day.   Whenever you are outside, protect yourself by wearing long sleeves, pants, a wide-brimmed hat, and sunglasses.    What should I know about osteoporosis?  Osteoporosis is a condition in which bone destruction happens more quickly than new bone creation. After menopause, you may be at an increased risk for osteoporosis. To help prevent osteoporosis or the bone fractures that can happen because of osteoporosis, the following is recommended:   If you are 19-50 years old, get at least 1,000 mg of calcium and at least 600 mg of vitamin D per day.   If you are older than age 50 but younger than age 70, get at least 1,200 mg of calcium and at   are experiencing depression. What should I know about immunizations? It is important that you get and maintain your immunizations. These include:  Tetanus, diphtheria, and pertussis (Tdap) booster vaccine.  Influenza every year before the flu season begins.  Pneumonia vaccine.  Shingles vaccine.  Your health care provider may also recommend other immunizations. This information is not intended to replace advice given to you by your health care provider. Make sure you discuss any questions you have with your health care provider. Document Released: 11/26/2005 Document Revised: 04/23/2016 Document Reviewed: 07/08/2015 Elsevier Interactive Patient Education  2018 [employer].

## 2017-03-23 NOTE — Progress Notes (Signed)
[name] 01-May-1956 [id number]    History:    Presents for breast and pelvic exam. Postmenopausal on no HRT with no bleeding. Uses occasional vaginal estrogen cream externally. Normal Pap and mammogram history. 01/2016 negative thyroid biopsy. Primary care manages hypertension, hypothyroidism, hypercholesterolemia. Jan 27, 2014 hep C treated and resolved. DEXA stable T score -1.1 FRAX 5.3%/0.3% done at primary care. Father died from colon cancer. 01/27/14 negative colonoscopy at [facility] follow-up. Chronic back pain has a nerve stimulator that was placed greater than 10 years ago helping with pain control, has had numerous back surgeries, ruptured disks.  Past medical history, past surgical history, family history and social history were all reviewed and documented in the EPIC chart. On disability due to chronic back pain. Has 1 daughter doing well.  ROS:  A ROS was performed and pertinent positives and negatives are included.  Exam:  Vitals:   03/23/17 1114  BP: 110/78  Weight: 178 lb (80.7 kg)  Height: 5\' 7"  (1.702 m)   Body mass index is 27.88 kg/m.   General appearance:  Normal Thyroid:  Symmetrical, normal in size, without palpable masses or nodularity. Respiratory  Auscultation:  Clear without wheezing or rhonchi Cardiovascular  Auscultation:  Regular rate, without rubs, murmurs or gallops  Edema/varicosities:  Not grossly evident Abdominal  Soft,nontender, without masses, guarding or rebound.  Liver/spleen:  No organomegaly noted  Hernia:  None appreciated  Skin  Inspection:  Grossly normal   Breasts: Examined lying and sitting.     Right: Without masses, retractions, discharge or axillary adenopathy.     Left: Without masses, retractions, discharge or axillary adenopathy. Gentitourinary   Inguinal/mons:  Normal without inguinal adenopathy  External genitalia:  Normal  BUS/Urethra/Skene's glands:  Normal  Vagina:  Normal  Cervix:  Normal  Uterus:   normal in size, shape  and contour.  Midline and mobile  Adnexa/parametria:     Rt: Without masses or tenderness.   Lt: Without masses or tenderness.  Anus and perineum: Normal  Digital rectal exam: Normal sphincter tone without palpated masses or tenderness  Assessment/Plan:  61 y.o. MWF G1 P1 for breast and pelvic exam with vaginal irritation.  Postmenopausal/no HRT/no bleeding Vaginal atrophy with irritation negative wet prep Hypertension/hyper clustering hypercholesteremia/hyperthyroidism-primary care manages labs and meds Mild osteopenia / stable without elevated FRAX primary care Chronic back pain-neurologist manages 27-Jan-2014 hep C resolved  Plan: SBE's, continue annual screening mammogram, calcium rich diet, vitamin D 2000 daily encouraged. Reviewed importance of weightbearing exercise, home safety and fall prevention discussed. Increase exercise and decrease calories/carbohydrates for weight loss encouraged. Will continue small amount of estradiol vaginal cream 3 times weekly, loose clothing and call if continued problems. Pap with high risk HPV, new screening guidelines reviewed.    [place], 12:19 PM 03/23/2017

## 2017-03-24 LAB — PAP, TP IMAGING W/ HPV RNA, RFLX HPV TYPE 16,18/45: HPV MRNA, HIGH RISK: NOT DETECTED

## 2017-04-12 DIAGNOSIS — M47816 Spondylosis without myelopathy or radiculopathy, lumbar region: Secondary | ICD-10-CM | POA: Diagnosis not present

## 2017-04-21 ENCOUNTER — Other Ambulatory Visit: Payer: Self-pay | Admitting: Nurse Practitioner

## 2017-04-21 ENCOUNTER — Ambulatory Visit
Admission: RE | Admit: 2017-04-21 | Discharge: 2017-04-21 | Disposition: A | Discharge status: Home / Self Care | Payer: Medicare HMO | Source: Ambulatory Visit | Attending: Nurse Practitioner | Admitting: Nurse Practitioner

## 2017-04-21 DIAGNOSIS — M545 Low back pain: Secondary | ICD-10-CM | POA: Diagnosis not present

## 2017-04-21 DIAGNOSIS — M47816 Spondylosis without myelopathy or radiculopathy, lumbar region: Secondary | ICD-10-CM

## 2017-04-22 DIAGNOSIS — M47816 Spondylosis without myelopathy or radiculopathy, lumbar region: Secondary | ICD-10-CM | POA: Diagnosis not present

## 2017-04-28 ENCOUNTER — Other Ambulatory Visit: Payer: Self-pay | Admitting: Nurse Practitioner

## 2017-04-28 DIAGNOSIS — M47816 Spondylosis without myelopathy or radiculopathy, lumbar region: Secondary | ICD-10-CM

## 2017-05-02 ENCOUNTER — Ambulatory Visit
Admission: RE | Admit: 2017-05-02 | Discharge: 2017-05-02 | Disposition: A | Discharge status: Home / Self Care | Payer: Medicare HMO | Source: Ambulatory Visit | Attending: Nurse Practitioner | Admitting: Nurse Practitioner

## 2017-05-02 DIAGNOSIS — M5136 Other intervertebral disc degeneration, lumbar region: Secondary | ICD-10-CM | POA: Diagnosis not present

## 2017-05-02 DIAGNOSIS — M47816 Spondylosis without myelopathy or radiculopathy, lumbar region: Secondary | ICD-10-CM

## 2017-05-02 DIAGNOSIS — M4696 Unspecified inflammatory spondylopathy, lumbar region: Secondary | ICD-10-CM | POA: Diagnosis not present

## 2017-05-04 ENCOUNTER — Other Ambulatory Visit: Payer: Self-pay | Admitting: Neurosurgery

## 2017-05-04 DIAGNOSIS — M47816 Spondylosis without myelopathy or radiculopathy, lumbar region: Secondary | ICD-10-CM

## 2017-05-10 DIAGNOSIS — E039 Hypothyroidism, unspecified: Secondary | ICD-10-CM | POA: Diagnosis not present

## 2017-05-11 ENCOUNTER — Inpatient Hospital Stay
Admission: RE | Admit: 2017-05-11 | Discharge: 2017-05-11 | Disposition: A | Discharge status: Home / Self Care | Payer: Medicare HMO | Source: Ambulatory Visit | Attending: Neurosurgery | Admitting: Neurosurgery

## 2017-05-20 ENCOUNTER — Other Ambulatory Visit: Payer: Medicare HMO

## 2017-05-31 DIAGNOSIS — M4696 Unspecified inflammatory spondylopathy, lumbar region: Secondary | ICD-10-CM | POA: Diagnosis not present

## 2017-05-31 DIAGNOSIS — M47816 Spondylosis without myelopathy or radiculopathy, lumbar region: Secondary | ICD-10-CM | POA: Diagnosis not present

## 2017-06-07 DIAGNOSIS — M25562 Pain in left knee: Secondary | ICD-10-CM | POA: Insufficient documentation

## 2017-06-07 DIAGNOSIS — M1712 Unilateral primary osteoarthritis, left knee: Secondary | ICD-10-CM | POA: Diagnosis not present

## 2017-06-15 DIAGNOSIS — G8918 Other acute postprocedural pain: Secondary | ICD-10-CM | POA: Diagnosis not present

## 2017-06-15 DIAGNOSIS — M6752 Plica syndrome, left knee: Secondary | ICD-10-CM | POA: Diagnosis not present

## 2017-06-15 DIAGNOSIS — S83222A Peripheral tear of medial meniscus, current injury, left knee, initial encounter: Secondary | ICD-10-CM | POA: Diagnosis not present

## 2017-06-15 DIAGNOSIS — M659 Synovitis and tenosynovitis, unspecified: Secondary | ICD-10-CM | POA: Diagnosis not present

## 2017-06-15 DIAGNOSIS — M94262 Chondromalacia, left knee: Secondary | ICD-10-CM | POA: Diagnosis not present

## 2017-06-15 DIAGNOSIS — Y999 Unspecified external cause status: Secondary | ICD-10-CM | POA: Diagnosis not present

## 2017-06-18 HISTORY — PX: KNEE SURGERY: SHX244

## 2017-06-21 DIAGNOSIS — M25662 Stiffness of left knee, not elsewhere classified: Secondary | ICD-10-CM | POA: Diagnosis not present

## 2017-06-21 DIAGNOSIS — Z9889 Other specified postprocedural states: Secondary | ICD-10-CM | POA: Diagnosis not present

## 2017-06-21 DIAGNOSIS — M25562 Pain in left knee: Secondary | ICD-10-CM | POA: Diagnosis not present

## 2017-06-21 DIAGNOSIS — R262 Difficulty in walking, not elsewhere classified: Secondary | ICD-10-CM | POA: Diagnosis not present

## 2017-06-29 DIAGNOSIS — Z9889 Other specified postprocedural states: Secondary | ICD-10-CM | POA: Diagnosis not present

## 2017-06-29 DIAGNOSIS — R262 Difficulty in walking, not elsewhere classified: Secondary | ICD-10-CM | POA: Diagnosis not present

## 2017-06-29 DIAGNOSIS — M25662 Stiffness of left knee, not elsewhere classified: Secondary | ICD-10-CM | POA: Diagnosis not present

## 2017-06-29 DIAGNOSIS — M25562 Pain in left knee: Secondary | ICD-10-CM | POA: Diagnosis not present

## 2017-07-01 DIAGNOSIS — R69 Illness, unspecified: Secondary | ICD-10-CM | POA: Diagnosis not present

## 2017-07-05 ENCOUNTER — Ambulatory Visit (HOSPITAL_COMMUNITY)
Admission: EM | Admit: 2017-07-05 | Discharge: 2017-07-05 | Disposition: A | Discharge status: Home / Self Care | Payer: Medicare HMO | Attending: Family Medicine | Admitting: Family Medicine

## 2017-07-05 ENCOUNTER — Encounter (HOSPITAL_COMMUNITY): Payer: Self-pay | Admitting: Emergency Medicine

## 2017-07-05 DIAGNOSIS — H8110 Benign paroxysmal vertigo, unspecified ear: Secondary | ICD-10-CM

## 2017-07-05 DIAGNOSIS — R5383 Other fatigue: Secondary | ICD-10-CM

## 2017-07-05 DIAGNOSIS — R110 Nausea: Secondary | ICD-10-CM | POA: Diagnosis not present

## 2017-07-05 MED ORDER — ONDANSETRON 4 MG PO TBDP: 4.0000 mg | ORAL_TABLET | Freq: Three times a day (TID) | ORAL | 0 refills | Status: DC | PRN

## 2017-07-05 MED ORDER — DIPHENHYDRAMINE HCL 50 MG/ML IJ SOLN
25.0000 mg | Freq: Once | INTRAMUSCULAR | Status: AC
  Administered 2017-07-05: 25 mg via INTRAMUSCULAR

## 2017-07-05 MED ORDER — MECLIZINE HCL 25 MG PO TABS: 25.0000 mg | ORAL_TABLET | Freq: Three times a day (TID) | ORAL | 0 refills | Status: DC | PRN

## 2017-07-05 MED ORDER — DIPHENHYDRAMINE HCL 50 MG/ML IJ SOLN
INTRAMUSCULAR | Status: AC
  Filled 2017-07-05: qty 1

## 2017-07-05 NOTE — ED Triage Notes (Signed)
PT reports dizziness that feels like the room is spinning. PT reports her diastolic BP was in the 15W yesterday. She cut her BP med in half per PCP today.   PT reports nausea, chills, dizziness, weakness, loss of appetite, fatigue that was present yesterday AM when she woke up at 0830.  PT had knee surgery 3 weeks ago. PT had flu and shingles shot within last 5 days.

## 2017-07-06 NOTE — ED Provider Notes (Signed)
[facility]   [id number] 07/05/17 Arrival Time: 7829  ASSESSMENT & PLAN:  1. Benign paroxysmal positional vertigo, unspecified laterality     Meds ordered this encounter  Medications  . diphenhydrAMINE (BENADRYL) injection 25 mg  . meclizine (ANTIVERT) 25 MG tablet    Sig: Take 1 tablet (25 mg total) by mouth 3 (three) times daily as needed for dizziness.    Dispense:  30 tablet    Refill:  0  . ondansetron (ZOFRAN-ODT) 4 MG disintegrating tablet    Sig: Take 1 tablet (4 mg total) by mouth every 8 (eight) hours as needed for nausea or vomiting.    Dispense:  15 tablet    Refill:  0   Recommend f/u 24 hours if not showing improvement or if worsening. Try to ensure adequate fluid intake. Reviewed expectations re: course of current medical issues. Questions answered. Outlined signs and symptoms indicating need for more acute intervention. Patient verbalized understanding. After Visit Summary given.   SUBJECTIVE:  [name] is a 61 y.o. female who presents with complaint of vertigo. Abrupt onset yesterday. Fairly persistent. Started taking half of BP dose yesterday per PCP instructions. No recent illnesses. Associated nausea and fatigue. No emesis. No headache or visual changes. No OTC treatment. No h/o similar. Shingles shot about 5 days ago. Afebrile. Vertigo aggravated with head movement; better when she can sit still.  ROS: As per HPI.   OBJECTIVE:  Vitals:   07/05/17 1739 07/05/17 1741 07/05/17 1859  BP: 123/72    Pulse: 86    Resp: 16    Temp: 98.8 F (37.1 C)  98.6 F (37 C)  TempSrc: Oral  Oral  SpO2: 100%    Weight:  182 lb (82.6 kg)   Height:  5\' 6"  (1.676 m)     General appearance: alert; no distress but does not want to move much at all; ambulatory but slow Eyes: PERRLA; EOMI; conjunctiva normal; mild nystagmus while laying on table (with vertigo symptoms) HENT: normocephalic; atraumatic; TMs normal; nasal mucosa normal; oral mucosa  normal Neck: supple Lungs: clear to auscultation bilaterally Heart: regular rate and rhythm Skin: warm and dry Neurologic: normal gait; normal symmetric reflexes in extremities Psychological: alert and cooperative; normal mood and affect  Allergies  Allergen Reactions  . Duloxetine Other (See Comments)     INCREASED LFTs  . Acetic Acid-Oxyquinoline Other (See Comments)    FEM PH; caused (vaginal) bleeding  . Buprenorphine Hcl Itching, Nausea Only and Rash  . Codeine Nausea Only  . Imitrex [Sumatriptan] Hives    "get hot and hyperventilate"  . Morphine Rash  . Morphine And Related Itching, Nausea Only and Rash  . Pentazocine Other (See Comments)    Caused vaginal bleeding; was given to patient by OB-GYN for pain secondary to GYN issue  . Pentazocine Lactate Other (See Comments)    Caused vaginal bleeding; was given to patient by OB-GYN for pain secondary to GYN issue  . Sumatriptan Succinate Hives    Hyperventilation "get hot and hyperventilate"    Past Medical History:  Diagnosis Date  . Anxiety   . Arthritis   . DDD (degenerative disc disease), lumbar   . Depression   . Diabetes mellitus 1-13   patient denies and says never made aware;  BORDERLINE DR. BALAN-diet controlled;   Marland Kitchen GERD (gastroesophageal reflux disease)   . Headache    migraines  . Hepatitis    genotype 1b hepatitis C virus, monitored by [facility] infectious disease  group  . History of bronchitis   . Hypertension   . Spinal cord stimulator status    placed 2006  . Thyroid disease    takes synthroid   Social History   Social History  . Marital status: Married    Spouse name: N/A  . Number of children: N/A  . Years of education: N/A   Occupational History  . Not on file.   Social History Main Topics  . Smoking status: Former Research scientist (life sciences)  . Smokeless tobacco: Never Used     Comment: quit smoking in 1992  . Alcohol use 0.0 oz/week     Comment: occ  . Drug use: No  . Sexual activity: Yes    Birth  control/ protection: None   Other Topics Concern  . Not on file   Social History Narrative   ** Merged History Encounter **    Drinks about 3 caffeine drinks a week    Family History  Problem Relation Age of Onset  . Hypertension Father   . Cancer Father        colon, liver  . Hypertension Sister   . Hypertension Brother   . Cancer Brother        lung, adrenal glands and brain  . Hypertension Brother    Past Surgical History:  Procedure Laterality Date  . Anterior cervical decompression and fusion with a reflex hyper plate  98/06/2118   E1-D4 and C5-C6 , Dr. Maia Plan  . BREAST EXCISIONAL BIOPSY Left 1982  . BREAST SURGERY  1983   left breast cyst removed  . cervical fusion w/OASIS system infuse  05/13/2006   C4-C5 and C5-C6 and C6-7,  Dr. Maia Plan  . CHOLECYSTECTOMY  ?2002  . CHOLECYSTECTOMY  2002  . COLONOSCOPY    . ESOPHAGEAL DILATION     Dr. Amedeo Plenty  . LAPAROSCOPIC ENDOMETRIOSIS FULGURATION  1980's   Dr. Warnell Forester  . LIVER BIOPSY  2002  . LOWER BACK SURGERY  OCT 2016  . LUMBAR DISC SURGERY  08/13/2003   Diskectomy/fusion L5-S1 rt., Dr. Maia Plan  . lumbar hemilaminectomy  01/1989   L5-S1 on right, Dr. Ardeen Jourdain  . NASAL SINUS SURGERY  10/30/2007   Left upper Sinus lift,  Dr. Mingo Amber  . SHOULDER ARTHROSCOPY WITH SUBACROMIAL DECOMPRESSION Right 12/06/2014   Procedure: SHOULDER ARTHROSCOPY WITH SUBACROMIAL DECOMPRESSION; EXCISION BONE SPUR;  Surgeon: Roseanne Kaufman, MD;  Location: [facility];  Service: Orthopedics;  Laterality: Right;  . SHOULDER SURGERY Right 12/06/14   "cleaned up bone spurs"  . SHOULDER SURGERY Right FEB 2016  . SPINAL CORD STIMULATOR INSERTION  12/29/2004   leads placed along spine from T8 to T10, Dr. Maia Plan  . Spinal cord stimulator pulse generator  01/05/2005   placed in right upper buttocks, Dr. Maia Plan  . THYROID LOBECTOMY Right 02/13/2016  . THYROIDECTOMY Right 02/13/2016   Procedure: RIGHT THYROID LOBECTOMY WITH FROZEN SECTION ;  Surgeon:  Izora Gala, MD;  Location: [facility];  Service: ENT;  Laterality: Right;  . Evonnie Dawes, MD 07/06/17 1004

## 2017-07-07 DIAGNOSIS — R42 Dizziness and giddiness: Secondary | ICD-10-CM | POA: Diagnosis not present

## 2017-07-07 DIAGNOSIS — R110 Nausea: Secondary | ICD-10-CM | POA: Diagnosis not present

## 2017-07-07 DIAGNOSIS — I10 Essential (primary) hypertension: Secondary | ICD-10-CM | POA: Diagnosis not present

## 2017-07-07 DIAGNOSIS — H8110 Benign paroxysmal vertigo, unspecified ear: Secondary | ICD-10-CM | POA: Diagnosis not present

## 2017-07-19 DIAGNOSIS — M1712 Unilateral primary osteoarthritis, left knee: Secondary | ICD-10-CM | POA: Diagnosis not present

## 2017-08-02 DIAGNOSIS — R69 Illness, unspecified: Secondary | ICD-10-CM | POA: Diagnosis not present

## 2017-08-02 DIAGNOSIS — Z6829 Body mass index (BMI) 29.0-29.9, adult: Secondary | ICD-10-CM | POA: Diagnosis not present

## 2017-08-17 DIAGNOSIS — H524 Presbyopia: Secondary | ICD-10-CM | POA: Diagnosis not present

## 2017-11-02 DIAGNOSIS — E038 Other specified hypothyroidism: Secondary | ICD-10-CM | POA: Diagnosis not present

## 2017-11-03 DIAGNOSIS — R5381 Other malaise: Secondary | ICD-10-CM | POA: Diagnosis not present

## 2017-11-03 DIAGNOSIS — B182 Chronic viral hepatitis C: Secondary | ICD-10-CM | POA: Diagnosis not present

## 2017-11-03 DIAGNOSIS — E063 Autoimmune thyroiditis: Secondary | ICD-10-CM | POA: Diagnosis not present

## 2017-11-03 DIAGNOSIS — F331 Major depressive disorder, recurrent, moderate: Secondary | ICD-10-CM | POA: Diagnosis not present

## 2017-11-03 DIAGNOSIS — Z6829 Body mass index (BMI) 29.0-29.9, adult: Secondary | ICD-10-CM | POA: Diagnosis not present

## 2017-11-07 ENCOUNTER — Ambulatory Visit: Payer: Medicare HMO | Admitting: Women's Health

## 2017-11-07 ENCOUNTER — Telehealth: Payer: Self-pay | Admitting: *Deleted

## 2017-11-07 ENCOUNTER — Ambulatory Visit
Admission: RE | Admit: 2017-11-07 | Discharge: 2017-11-07 | Disposition: A | Discharge status: Home / Self Care | Payer: Medicare HMO | Source: Ambulatory Visit | Attending: Women's Health | Admitting: Women's Health

## 2017-11-07 ENCOUNTER — Encounter: Payer: Self-pay | Admitting: Women's Health

## 2017-11-07 VITALS — BP 138/80

## 2017-11-07 DIAGNOSIS — N63 Unspecified lump in unspecified breast: Secondary | ICD-10-CM

## 2017-11-07 DIAGNOSIS — N6320 Unspecified lump in the left breast, unspecified quadrant: Secondary | ICD-10-CM | POA: Diagnosis not present

## 2017-11-07 DIAGNOSIS — R928 Other abnormal and inconclusive findings on diagnostic imaging of breast: Secondary | ICD-10-CM | POA: Diagnosis not present

## 2017-11-07 NOTE — Telephone Encounter (Signed)
-----   Message from Huel Cote, NP sent at 11/07/2017 12:26 PM EST ----- Needs diagnostic mammogram, lt breast at 5 1 cm mobile tender nodule for 1 week

## 2017-11-07 NOTE — Telephone Encounter (Signed)
Pt scheduled on today at 3:10pm at breast center. Pt aware.

## 2017-11-07 NOTE — Progress Notes (Signed)
62yo MWF G1P1 presents with new onset painful lump on left breast 1 week. Postmenopausal with no HRT with no bleeding. Normal  mammogram Hx. 02/2017 normal mammogram, denies breast cancer history in family. No change in dietary caffeine, drinks 1 cup of coffee per day. Denies breast injury or change in activity. Hx of chronic back and multiple back and neck surgeries. Primary care manages hyprtension, hypothyroidism, anxiety/depression and hypercholesterolemia.   Exam: Appears well, slightly worried. Breast exam and sitting and lying position, right breast nipple inversion always, no visible dimpling, erythema. right breast no palpable nodules. Left breast at 5:00 position 1 cm mobile, smooth, tender nodule.  Left breast mass at 5:00  Plan: Diagnostic mammogram with ultrasound, scheduled for today at 3 PM at the breast center. Appreciative of appointment.

## 2017-11-29 DIAGNOSIS — M47816 Spondylosis without myelopathy or radiculopathy, lumbar region: Secondary | ICD-10-CM | POA: Diagnosis not present

## 2017-12-22 ENCOUNTER — Other Ambulatory Visit: Payer: Self-pay | Admitting: Internal Medicine

## 2017-12-22 DIAGNOSIS — R0602 Shortness of breath: Secondary | ICD-10-CM

## 2017-12-22 DIAGNOSIS — F331 Major depressive disorder, recurrent, moderate: Secondary | ICD-10-CM | POA: Diagnosis not present

## 2017-12-22 DIAGNOSIS — F411 Generalized anxiety disorder: Secondary | ICD-10-CM | POA: Diagnosis not present

## 2017-12-22 DIAGNOSIS — Z6829 Body mass index (BMI) 29.0-29.9, adult: Secondary | ICD-10-CM | POA: Diagnosis not present

## 2017-12-24 ENCOUNTER — Encounter (HOSPITAL_COMMUNITY): Payer: Self-pay | Admitting: Emergency Medicine

## 2017-12-24 ENCOUNTER — Emergency Department (HOSPITAL_COMMUNITY): Payer: Medicare HMO

## 2017-12-24 ENCOUNTER — Emergency Department (HOSPITAL_COMMUNITY)
Admission: EM | Admit: 2017-12-24 | Discharge: 2017-12-24 | Disposition: A | Discharge status: Home / Self Care | Payer: Medicare HMO | Attending: Emergency Medicine | Admitting: Emergency Medicine

## 2017-12-24 DIAGNOSIS — Z87891 Personal history of nicotine dependence: Secondary | ICD-10-CM | POA: Insufficient documentation

## 2017-12-24 DIAGNOSIS — N200 Calculus of kidney: Secondary | ICD-10-CM

## 2017-12-24 DIAGNOSIS — R112 Nausea with vomiting, unspecified: Secondary | ICD-10-CM | POA: Diagnosis not present

## 2017-12-24 DIAGNOSIS — N201 Calculus of ureter: Secondary | ICD-10-CM | POA: Diagnosis not present

## 2017-12-24 DIAGNOSIS — N132 Hydronephrosis with renal and ureteral calculous obstruction: Secondary | ICD-10-CM | POA: Diagnosis not present

## 2017-12-24 DIAGNOSIS — I10 Essential (primary) hypertension: Secondary | ICD-10-CM | POA: Diagnosis not present

## 2017-12-24 DIAGNOSIS — N130 Hydronephrosis with ureteropelvic junction obstruction: Secondary | ICD-10-CM | POA: Diagnosis not present

## 2017-12-24 DIAGNOSIS — R300 Dysuria: Secondary | ICD-10-CM | POA: Diagnosis not present

## 2017-12-24 DIAGNOSIS — E039 Hypothyroidism, unspecified: Secondary | ICD-10-CM | POA: Diagnosis not present

## 2017-12-24 DIAGNOSIS — Z79899 Other long term (current) drug therapy: Secondary | ICD-10-CM | POA: Diagnosis not present

## 2017-12-24 LAB — CBC WITH DIFFERENTIAL/PLATELET
BASOS ABS: 0 10*3/uL (ref 0.0–0.1)
BASOS PCT: 0 %
EOS ABS: 0.1 10*3/uL (ref 0.0–0.7)
EOS PCT: 1 %
HCT: 40.5 % (ref 36.0–46.0)
Hemoglobin: 13.6 g/dL (ref 12.0–15.0)
Lymphocytes Relative: 14 %
Lymphs Abs: 1.7 10*3/uL (ref 0.7–4.0)
MCH: 28.6 pg (ref 26.0–34.0)
MCHC: 33.6 g/dL (ref 30.0–36.0)
MCV: 85.3 fL (ref 78.0–100.0)
MONO ABS: 0.8 10*3/uL (ref 0.1–1.0)
Monocytes Relative: 7 %
Neutro Abs: 10 10*3/uL — ABNORMAL HIGH (ref 1.7–7.7)
Neutrophils Relative %: 78 %
PLATELETS: 246 10*3/uL (ref 150–400)
RBC: 4.75 MIL/uL (ref 3.87–5.11)
RDW: 13.9 % (ref 11.5–15.5)
WBC: 12.7 10*3/uL — AB (ref 4.0–10.5)

## 2017-12-24 LAB — URINALYSIS, ROUTINE W REFLEX MICROSCOPIC
BACTERIA UA: NONE SEEN
Bilirubin Urine: NEGATIVE
Glucose, UA: NEGATIVE mg/dL
KETONES UR: NEGATIVE mg/dL
NITRITE: NEGATIVE
PROTEIN: NEGATIVE mg/dL
Specific Gravity, Urine: 1.01 (ref 1.005–1.030)
pH: 5 (ref 5.0–8.0)

## 2017-12-24 LAB — BASIC METABOLIC PANEL
Anion gap: 6 (ref 5–15)
BUN: 22 mg/dL — AB (ref 6–20)
CALCIUM: 9.3 mg/dL (ref 8.9–10.3)
CO2: 28 mmol/L (ref 22–32)
Chloride: 104 mmol/L (ref 101–111)
Creatinine, Ser: 1.36 mg/dL — ABNORMAL HIGH (ref 0.44–1.00)
GFR calc Af Amer: 48 mL/min — ABNORMAL LOW (ref >60)
GFR, EST NON AFRICAN AMERICAN: 41 mL/min — AB (ref >60)
GLUCOSE: 111 mg/dL — AB (ref 65–99)
Potassium: 4.1 mmol/L (ref 3.5–5.1)
SODIUM: 138 mmol/L (ref 135–145)

## 2017-12-24 MED ORDER — ONDANSETRON 4 MG PO TBDP: 4.0000 mg | ORAL_TABLET | Freq: Three times a day (TID) | ORAL | 0 refills | Status: DC | PRN

## 2017-12-24 MED ORDER — ONDANSETRON HCL 4 MG/2ML IJ SOLN
4.0000 mg | Freq: Once | INTRAMUSCULAR | Status: AC
  Administered 2017-12-24: 4 mg via INTRAVENOUS
  Filled 2017-12-24: qty 2

## 2017-12-24 MED ORDER — KETOROLAC TROMETHAMINE 30 MG/ML IJ SOLN
15.0000 mg | Freq: Once | INTRAMUSCULAR | Status: AC
  Administered 2017-12-24: 15 mg via INTRAVENOUS
  Filled 2017-12-24: qty 1

## 2017-12-24 MED ORDER — HYDROMORPHONE HCL 1 MG/ML IJ SOLN
0.5000 mg | Freq: Once | INTRAMUSCULAR | Status: AC
  Administered 2017-12-24: 0.5 mg via INTRAVENOUS
  Filled 2017-12-24: qty 1

## 2017-12-24 MED ORDER — NAPROXEN 500 MG PO TABS: 500.0000 mg | ORAL_TABLET | Freq: Two times a day (BID) | ORAL | 0 refills | Status: DC

## 2017-12-24 MED ORDER — MORPHINE SULFATE (PF) 4 MG/ML IV SOLN
4.0000 mg | Freq: Once | INTRAVENOUS | Status: AC
  Administered 2017-12-24: 4 mg via INTRAVENOUS
  Filled 2017-12-24: qty 1

## 2017-12-24 MED ORDER — TAMSULOSIN HCL 0.4 MG PO CAPS: 0.4000 mg | ORAL_CAPSULE | Freq: Every day | ORAL | 0 refills | Status: DC

## 2017-12-24 MED ORDER — SODIUM CHLORIDE 0.9 % IV BOLUS (SEPSIS)
1000.0000 mL | Freq: Once | INTRAVENOUS | Status: AC
  Administered 2017-12-24: 1000 mL via INTRAVENOUS

## 2017-12-24 NOTE — ED Provider Notes (Signed)
[facility] DEPT Provider Note   CSN: [id number] Arrival date & time: 12/24/17  1446     History   Chief Complaint Chief Complaint  Patient presents with  . Urinary Frequency  . Dysuria    HPI [name] is a 62 y.o. female with a past medical history of arthritis, hypertension, hypothyroidism, who presents to ED for evaluation of 5-hour history of constant, sharp left-sided flank pain, dysuria, urinary frequency and urgency that states feel like "either a kidney stone passing or UTI, kidney infection."  She reports associated nausea and several episodes of NBNB emesis.  States that she was told several years ago that she had an obstructing stone.  Cannot recall any inciting event that may have triggered the pain.  Denies any diarrhea, constipation, fevers, midline back pain, injuries or falls. She has not taken any medications PTA for pain.   HPI  Past Medical History:  Diagnosis Date  . Arthritis   . DDD (degenerative disc disease), lumbar   . Diabetes mellitus 1-13   patient denies and says never made aware;  BORDERLINE DR. BALAN-diet controlled;   Marland Kitchen GERD (gastroesophageal reflux disease)   . Hepatitis    genotype 1b hepatitis C virus, monitored by [facility] infectious disease group  . History of bronchitis   . Hypertension   . Spinal cord stimulator status    placed 2006  . Thyroid disease    takes synthroid    Patient Active Problem List   Diagnosis Date Noted  . Secondary central sleep apnea 09/27/2016  . Snoring 09/27/2016  . Other chronic pain 09/27/2016  . CKD (chronic kidney disease), stage III ([place]) 09/27/2016  . Failed back syndrome, lumbar 09/27/2016  . Positive for microalbuminuria 09/27/2016  . Hypertension 09/27/2016  . S/P thyroidectomy 02/13/2016  . Vulvodynia 08/13/2014  . Vaginal atrophy 02/18/2014  . Endometriosis   . SINUSITIS- ACUTE-NOS 07/19/2008  . ELEVATED BLOOD PRESSURE WITHOUT DIAGNOSIS OF HYPERTENSION  07/05/2008  . DEPRESSION/ANXIETY 01/24/2008  . OBESITY 11/30/2007  . CONSTIPATION, CHRONIC 04/27/2007  . INSOMNIA, PERSISTENT 04/06/2007  . HYPOTHYROIDISM 02/07/2007  . OSTEOPENIA 02/07/2007  . HEADACHE 02/07/2007  . HEPATITIS C 03/18/2002    Past Surgical History:  Procedure Laterality Date  . Anterior cervical decompression and fusion with a reflex hyper plate  60/03/3015   W1-U9 and C5-C6 , Dr. Maia Plan  . BREAST EXCISIONAL BIOPSY Left 1982  . BREAST SURGERY  1983   left breast cyst removed  . cervical fusion w/OASIS system infuse  05/13/2006   C4-C5 and C5-C6 and C6-7,  Dr. Maia Plan  . CHOLECYSTECTOMY  ?2002  . CHOLECYSTECTOMY  2002  . COLONOSCOPY    . ESOPHAGEAL DILATION     Dr. Amedeo Plenty  . KNEE SURGERY  06/2017  . LAPAROSCOPIC ENDOMETRIOSIS FULGURATION  1980's   Dr. Warnell Forester  . LIVER BIOPSY  2002  . LOWER BACK SURGERY  OCT 2016  . LUMBAR DISC SURGERY  08/13/2003   Diskectomy/fusion L5-S1 rt., Dr. Maia Plan  . lumbar hemilaminectomy  01/1989   L5-S1 on right, Dr. Ardeen Jourdain  . NASAL SINUS SURGERY  10/30/2007   Left upper Sinus lift,  Dr. Mingo Amber  . SHOULDER ARTHROSCOPY WITH SUBACROMIAL DECOMPRESSION Right 12/06/2014   Procedure: SHOULDER ARTHROSCOPY WITH SUBACROMIAL DECOMPRESSION; EXCISION BONE SPUR;  Surgeon: Roseanne Kaufman, MD;  Location: [facility];  Service: Orthopedics;  Laterality: Right;  . SHOULDER SURGERY Right 12/06/14   "cleaned up bone spurs"  . SHOULDER SURGERY  Right FEB 2016  . SPINAL CORD STIMULATOR INSERTION  12/29/2004   leads placed along spine from T8 to T10, Dr. Maia Plan  . Spinal cord stimulator pulse generator  01/05/2005   placed in right upper buttocks, Dr. Maia Plan  . THYROID LOBECTOMY Right 02/13/2016  . THYROIDECTOMY Right 02/13/2016   Procedure: RIGHT THYROID LOBECTOMY WITH FROZEN SECTION ;  Surgeon: Izora Gala, MD;  Location: [facility];  Service: ENT;  Laterality: Right;  . TONSILLECTOMY      OB History    Gravida Para Term Preterm AB Living   1  1 0 0 0 1   SAB TAB Ectopic Multiple Live Births   0 0 0           Home Medications    Prior to Admission medications   Medication Sig Start Date End Date Taking? Authorizing Provider  FLUoxetine (PROZAC) 40 MG capsule Take 40 mg by mouth daily.  01/14/16   [provider]  fluticasone (FLONASE) 50 MCG/ACT nasal spray Place 2 sprays into the nose daily as needed for allergies.     [provider]  levothyroxine (SYNTHROID, LEVOTHROID) 112 MCG tablet TAKE 1 TABLET BY MOUTH EVERY DAY 02/10/16   [provider]  lisinopril-hydrochlorothiazide (PRINZIDE,ZESTORETIC) 20-12.5 MG per tablet Take 1 tablet by mouth every morning.     [provider]  meclizine (ANTIVERT) 25 MG tablet Take 1 tablet (25 mg total) by mouth 3 (three) times daily as needed for dizziness. 07/05/17   Vanessa Kick, MD  meloxicam (MOBIC) 7.5 MG tablet Take 7.5 mg by mouth daily as needed for pain.     [provider]  naproxen (NAPROSYN) 500 MG tablet Take 1 tablet (500 mg total) by mouth 2 (two) times daily. 12/24/17   Sameena Artus, PA-C  omeprazole (PRILOSEC) 20 MG capsule Take 20 mg by mouth every 12 (twelve) hours.     [provider]  ondansetron (ZOFRAN ODT) 4 MG disintegrating tablet Take 1 tablet (4 mg total) by mouth every 8 (eight) hours as needed for nausea or vomiting. 12/24/17   Derry Kassel, PA-C  polyethylene glycol (MIRALAX / GLYCOLAX) packet Take 17 g by mouth daily as needed for mild constipation or moderate constipation. 12/24/15   Antonietta Breach, PA-C  pravastatin (PRAVACHOL) 20 MG tablet Take 20 mg by mouth at bedtime.     [provider]  Propylene Glycol (SYSTANE BALANCE) 0.6 % SOLN Apply to eye.    [provider]  rizatriptan (MAXALT) 10 MG tablet Take 10 mg by mouth as needed for migraine. May repeat in 2 hours if needed    [provider]  tamsulosin (FLOMAX) 0.4 MG CAPS capsule Take 1 capsule (0.4 mg total) by mouth daily after  supper. 12/24/17   Makenah Karas, PA-C  traMADol (ULTRAM) 50 MG tablet Take 1 tablet (50 mg total) by mouth every 6 (six) hours as needed. 02/13/16   Izora Gala, MD  traZODone (DESYREL) 50 MG tablet Take 50 mg by mouth at bedtime as needed for sleep.  11/18/15   [provider]    Family History Family History  Problem Relation Age of Onset  . Hypertension Father   . Cancer Father        colon, liver  . Hypertension Sister   . Hypertension Brother   . Cancer Brother        lung, adrenal glands and brain  . Hypertension Brother     Social History Social History  Tobacco Use  . Smoking status: Former Research scientist (life sciences)  . Smokeless tobacco: Never Used  . Tobacco comment: quit smoking in 1992  Substance Use Topics  . Alcohol use: Yes    Alcohol/week: 0.0 oz    Comment: occ  . Drug use: No     Allergies   Duloxetine; Acetic acid-oxyquinoline; Buprenorphine hcl; Codeine; Imitrex [sumatriptan]; Morphine; Morphine and related; Pentazocine; Pentazocine lactate; and Sumatriptan succinate   Review of Systems Review of Systems  Constitutional: Negative for appetite change, chills and fever.  HENT: Negative for ear pain, rhinorrhea, sneezing and sore throat.   Eyes: Negative for photophobia and visual disturbance.  Respiratory: Negative for cough, chest tightness, shortness of breath and wheezing.   Cardiovascular: Negative for chest pain and palpitations.  Gastrointestinal: Positive for nausea and vomiting. Negative for abdominal pain, blood in stool, constipation and diarrhea.  Genitourinary: Positive for dysuria, flank pain, frequency and urgency. Negative for hematuria.  Musculoskeletal: Negative for myalgias.  Skin: Negative for rash.  Neurological: Negative for dizziness, weakness and light-headedness.     Physical Exam Updated Vital Signs BP 118/60 (BP Location: Left Arm)   Pulse 79   Temp 97.9 F (36.6 C) (Oral)   Resp 20   Ht 5\' 6"  (1.676 m)   Wt 83.1 kg (183 lb 3  oz)   LMP 06/24/2012   SpO2 94%   BMI 29.57 kg/m   Physical Exam  Constitutional: She appears well-developed and well-nourished. No distress.  HENT:  Head: Normocephalic and atraumatic.  Nose: Nose normal.  Eyes: Conjunctivae and EOM are normal. Left eye exhibits no discharge. No scleral icterus.  Neck: Normal range of motion. Neck supple.  Cardiovascular: Normal rate, regular rhythm, normal heart sounds and intact distal pulses. Exam reveals no gallop and no friction rub.  No murmur heard. Pulmonary/Chest: Effort normal and breath sounds normal. No respiratory distress.  Abdominal: Soft. Bowel sounds are normal. She exhibits no distension. There is tenderness. There is no guarding.    Musculoskeletal: Normal range of motion. She exhibits no edema.  Neurological: She is alert. She exhibits normal muscle tone. Coordination normal.  Skin: Skin is warm and dry. No rash noted.  Psychiatric: She has a normal mood and affect.  Nursing note and vitals reviewed.    ED Treatments / Results  Labs (all labs ordered are listed, but only abnormal results are displayed) Labs Reviewed  URINALYSIS, ROUTINE W REFLEX MICROSCOPIC - Abnormal; Notable for the following components:      Result Value   Hgb urine dipstick SMALL (*)    Leukocytes, UA SMALL (*)    Squamous Epithelial / LPF 0-5 (*)    All other components within normal limits  BASIC METABOLIC PANEL - Abnormal; Notable for the following components:   Glucose, Bld 111 (*)    BUN 22 (*)    Creatinine, Ser 1.36 (*)    GFR calc non Af Amer 41 (*)    GFR calc Af Amer 48 (*)    All other components within normal limits  CBC WITH DIFFERENTIAL/PLATELET - Abnormal; Notable for the following components:   WBC 12.7 (*)    Neutro Abs 10.0 (*)    All other components within normal limits  URINE CULTURE    EKG  EKG Interpretation None       Radiology Ct Renal Stone Study  Result Date: 12/24/2017 CLINICAL DATA:  Left lower quadrant  pain and back pain since this morning, sudden onset with frequent urination. EXAM: CT ABDOMEN  AND PELVIS WITHOUT CONTRAST TECHNIQUE: Multidetector CT imaging of the abdomen and pelvis was performed following the standard protocol without IV contrast. COMPARISON:  CT abdomen dated 12/24/2015. FINDINGS: Lower chest: No acute abnormality. Hepatobiliary: No focal liver abnormality is seen. Status post cholecystectomy. No biliary dilatation. Pancreas: Unremarkable. No pancreatic ductal dilatation or surrounding inflammatory changes. Spleen: Normal in size without focal abnormality. Adrenals/Urinary Tract: 4 mm stone at the left UVJ causing mild left-sided hydronephrosis. Stable cyst within the right kidney.  Bladder is decompressed. Stomach/Bowel: No dilated large or small bowel loops. No bowel wall thickening or evidence of bowel wall inflammation. Appendix is normal. Stomach is unremarkable. Vascular/Lymphatic: Aortic atherosclerosis. No enlarged abdominal or pelvic lymph nodes. Reproductive: Uterus and bilateral adnexa are unremarkable. Other: No free fluid or abscess collection. No free intraperitoneal air. Musculoskeletal: No acute or suspicious osseous finding. Stimulator wires within the central canal of the thoracic spine. IMPRESSION: 1. 3 mm stone at the left UVJ causing mild left-sided hydronephrosis. 2. Aortic atherosclerosis. 3. Additional chronic/incidental findings detailed above. Electronically Signed   By: Franki Cabot M.D.   On: 12/24/2017 17:08    Procedures Procedures (including critical care time)  Medications Ordered in ED Medications  sodium chloride 0.9 % bolus 1,000 mL (0 mLs Intravenous Stopped 12/24/17 1823)  ondansetron (ZOFRAN) injection 4 mg (4 mg Intravenous Given 12/24/17 1720)  morphine 4 MG/ML injection 4 mg (4 mg Intravenous Given 12/24/17 1721)  sodium chloride 0.9 % bolus 1,000 mL (1,000 mLs Intravenous New Bag/Given 12/24/17 1823)  ketorolac (TORADOL) 30 MG/ML injection 15 mg (15  mg Intravenous Given 12/24/17 1840)  HYDROmorphone (DILAUDID) injection 0.5 mg (0.5 mg Intravenous Given 12/24/17 1855)     Initial Impression / Assessment and Plan / ED Course  I have reviewed the triage vital signs and the nursing notes.  Pertinent labs & imaging results that were available during my care of the patient were reviewed by me and considered in my medical decision making (see chart for details).     Patient presents to ED for evaluation of acute onset, sharp, left-sided flank pain, dysuria and urinary frequency.  She does have a distant history of kidney stones x1.  Physical exam she does appear uncomfortable.  She does not appear dehydrated.  She has left-sided flank tenderness to palpation.  CT showed 3 mm stone on the left side at the UVJ with mild hydronephrosis.  Leukocytosis at 12.7.  BMP with mild elevation BUN and creatinine to 22 and 1.36.  Urinalysis with no evidence of UTI but will be sent for culture.  She is afebrile.  Pain is controlled here with Dilaudid, Toradol, morphine patient is able to tolerate p.o. intake with Zofran given here.  Will give anti-inflammatories at home as patient states that she is unable to tolerate Percocet, Vicodin, but does have home tramadol for her chronic back pain. Of note, she did ask for PO Dilaudid to take home, which I declined.  Advised to follow-up with urologist for further evaluation if symptoms persist. Patient appears stable for d/c at this time. Strict return precautions given.  Portions of this note were generated with L[employer]. Dictation errors may occur despite best attempts at proofreading.   Final Clinical Impressions(s) / ED Diagnoses   Final diagnoses:  Nephrolithiasis    ED Discharge Orders        Ordered    naproxen (NAPROSYN) 500 MG tablet  2 times daily     12/24/17 2013    tamsulosin (  FLOMAX) 0.4 MG CAPS capsule  Daily after supper     12/24/17 2013    ondansetron (ZOFRAN ODT) 4 MG  disintegrating tablet  Every 8 hours PRN     12/24/17 2013       Delia Heady, PA-C 12/24/17 2017    Fatima Blank, MD 12/25/17 0040

## 2017-12-24 NOTE — ED Notes (Signed)
Pt c/o increased pain. Patient crying and stating her stomach hurts. Patient requesting PA to bedside. PA aware and to bedside. Medication given.

## 2017-12-24 NOTE — ED Notes (Signed)
Patient discharged in good condition. Discharge information was reviewed with patient and she verbalized understanding. She consented to discharged and went home with a family member.

## 2017-12-24 NOTE — Discharge Instructions (Signed)
Please read attached information regarding your condition. Take naproxen as needed for pain.  Take Zofran as needed for nausea.   Take Flomax nightly. Follow with urologist listed below for further evaluation if your symptoms persist for a week. Continue your home medications as previously prescribed. Return to ED for worsening symptoms, severe abdominal pain or chest pain, fevers, blood in stool or vomit.

## 2017-12-28 ENCOUNTER — Encounter: Payer: Self-pay | Admitting: Cardiology

## 2017-12-28 ENCOUNTER — Ambulatory Visit: Payer: Medicare HMO | Admitting: Cardiology

## 2017-12-28 VITALS — BP 112/78 | HR 88 | Ht 66.0 in | Wt 185.4 lb

## 2017-12-28 DIAGNOSIS — R0602 Shortness of breath: Secondary | ICD-10-CM | POA: Insufficient documentation

## 2017-12-28 DIAGNOSIS — I10 Essential (primary) hypertension: Secondary | ICD-10-CM | POA: Diagnosis not present

## 2017-12-28 DIAGNOSIS — I208 Other forms of angina pectoris: Secondary | ICD-10-CM

## 2017-12-28 DIAGNOSIS — I2089 Other forms of angina pectoris: Secondary | ICD-10-CM | POA: Insufficient documentation

## 2017-12-28 NOTE — Assessment & Plan Note (Signed)
Exertional dyspnea -- now progressively worsening.  Cannot exclude coronary ischemic symptom as Atypical Angina.  Plan: Lexiscan Myoview -- she does not think that she would be able to walk on a TM due to back issues. Check 2 D Echo to assess for Regional WMA

## 2017-12-28 NOTE — Assessment & Plan Note (Signed)
She is supposed be having PFTs done, but this was to be scheduled At [facility].  She does not intend to go through with this visit since she is already seeing a cardiologist today.  I will therefore order PFTs.  She does have the We will check a 2D echocardiogram to evaluate for regional wall motion normality, EF, filling pressures and right-sided pressures.  Also for any valve lesions although I hear nothing on exam. Also checking Myoview stress test to exclude ischemia.

## 2017-12-28 NOTE — Assessment & Plan Note (Signed)
BP stable today on lisinopril/HCTZ. Based on results, may need to adjust

## 2017-12-28 NOTE — Progress Notes (Signed)
PCP: Haywood Pao, MD  Clinic Note: Chief Complaint  Patient presents with  . Shortness of Breath    refer by Dr Osborne Casco, pt c/o SOB since january with any activity she's doing whether it be cleaning the house or walking the dog she gets out of breath really quickly     HPI: [name] is a 62 y.o. female who is being seen today for the evaluation of shortness of breath/"cannot catch my breath" at the request of Tisovec, Fransico Him, MD seen by Claud Kelp, FNP--   Brock Bad was last seen on March 7 by Ms. Lee with a complaint of cold catching her breath.  She noted being short of breath cooking dinner, lying down to sleep etc.  Just have a hard time getting a breath.  Also notes having some anxiety about possibly having cancer. -->  Interestingly, the recommendations were cardiology consultation along with PFTs and a CT scan of the chest.  She has in the note listed referral to Dr. Irven Shelling office with [facility] tomorrow --> this is along with PFTs.  However they are confused because she is here today and would prefer to simply have her cardiology care done here.  Recent Hospitalizations: None  Studies Personally Reviewed - (if available, images/films reviewed: From Epic Chart or Care Everywhere)  No studies -by report, she had a stress test back in 2013 that was read as negative.  Did not follow back up with cardiology.  Interval History: [name] presents here today again to discuss this symptoms of shortness of breath.  She tells me that pretty much she is short of breath all the time rest and exertion.  It is worse with exertion, but it can be worse with simply washing the dishes, making the bed etc.  Does note that she is short of breath more when she lies down flat, but still sleeps on one pillow, but she will occasionally wake up at night short of breath to sit up.  Difficult to tell if this is true orthopnea and PND, but it sounds similar.  She denies any edema.   She denies any chest tightness or pressure associated with it.  She denies any rapid irregular heartbeats palpitations.  No lightheadedness, dizziness or syncope/near syncope -- except when she is really short of breath having dizziness.  She denies any wheezing or coughing symptoms.  Nothing to suggest cold or flu type symptoms for now, but did have an episode weeks ago.  No melena, hematochezia, hematuria, or epstaxis. No claudication.  ROS: A comprehensive was performed. Review of Systems  Constitutional: Positive for malaise/fatigue. Negative for chills and fever.  HENT: Negative for congestion and nosebleeds.   Eyes: Negative.   Respiratory: Positive for shortness of breath. Negative for cough and wheezing.   Neurological: Positive for dizziness (Only when very short of breath). Negative for weakness.  Psychiatric/Behavioral: Negative for depression and memory loss. The patient is nervous/anxious. The patient does not have insomnia (But she does wake up sometimes to urinate or to catch her breath).   All other systems reviewed and are negative.   I have reviewed and (if needed) personally updated the patient's problem list, medications, allergies, past medical and surgical history, social and family history.   Past Medical History:  Diagnosis Date  . Arthritis   . DDD (degenerative disc disease), lumbar   . Diabetes mellitus 1-13   patient denies and says never made aware;  BORDERLINE DR. BALAN-diet  controlled;   Marland Kitchen GERD (gastroesophageal reflux disease)   . Hepatitis    genotype 1b hepatitis C virus, monitored by [facility] infectious disease [facility]  . History of bronchitis   . Hyperlipidemia   . Hypertension   . Spinal cord stimulator status    placed 2006  . Thyroid disease    takes synthroid    Past Surgical History:  Procedure Laterality Date  . Anterior cervical decompression and fusion with a reflex hyper plate  62/03/9484   I6-E7 and C5-C6 , Dr. Maia Plan  . BREAST  EXCISIONAL BIOPSY Left 1982  . BREAST SURGERY  1983   left breast cyst removed  . cervical fusion w/OASIS system infuse  05/13/2006   C4-C5 and C5-C6 and C6-7,  Dr. Maia Plan  . CHOLECYSTECTOMY  ?2002  . CHOLECYSTECTOMY  2002  . COLONOSCOPY    . ESOPHAGEAL DILATION     Dr. Amedeo Plenty  . KNEE SURGERY  06/2017  . LAPAROSCOPIC ENDOMETRIOSIS FULGURATION  1980's   Dr. Warnell Forester  . LIVER BIOPSY  2002  . LOWER BACK SURGERY  OCT 2016  . LUMBAR DISC SURGERY  08/13/2003   Diskectomy/fusion L5-S1 rt., Dr. Maia Plan  . lumbar hemilaminectomy  01/1989   L5-S1 on right, Dr. Ardeen Jourdain  . NASAL SINUS SURGERY  10/30/2007   Left upper Sinus lift,  Dr. Mingo Amber  . SHOULDER ARTHROSCOPY WITH SUBACROMIAL DECOMPRESSION Right 12/06/2014   Procedure: SHOULDER ARTHROSCOPY WITH SUBACROMIAL DECOMPRESSION; EXCISION BONE SPUR;  Surgeon: Roseanne Kaufman, MD;  Location: [place];  Service: Orthopedics;  Laterality: Right;  . SHOULDER SURGERY Right 12/06/14   "cleaned up bone spurs"  . SHOULDER SURGERY Right FEB 2016  . SPINAL CORD STIMULATOR INSERTION  12/29/2004   leads placed along spine from T8 to T10, Dr. Maia Plan  . Spinal cord stimulator pulse generator  01/05/2005   placed in right upper buttocks, Dr. Maia Plan  . THYROID LOBECTOMY Right 02/13/2016  . THYROIDECTOMY Right 02/13/2016   Procedure: RIGHT THYROID LOBECTOMY WITH FROZEN SECTION ;  Surgeon: Izora Gala, MD;  Location: [place];  Service: ENT;  Laterality: Right;  . TONSILLECTOMY      Current Meds  Medication Sig  . buPROPion (WELLBUTRIN XL) 150 MG 24 hr tablet Take 1 tablet by mouth daily.  . cyclobenzaprine (FLEXERIL) 10 MG tablet Take 10 mg by mouth as needed.  Marland Kitchen FLUoxetine (PROZAC) 40 MG capsule Take 40 mg by mouth daily.   . fluticasone (FLONASE) 50 MCG/ACT nasal spray Place 2 sprays into the nose daily as needed for allergies.   Marland Kitchen gabapentin (NEURONTIN) 300 MG capsule Take 1 capsule by mouth daily.  Marland Kitchen levothyroxine (SYNTHROID, LEVOTHROID) 125 MCG  tablet Take 1 tablet by mouth daily with breakfast.  . lisinopril-hydrochlorothiazide (PRINZIDE,ZESTORETIC) 20-12.5 MG per tablet Take 1 tablet by mouth every morning.   Marland Kitchen LORazepam (ATIVAN) 2 MG tablet Take 2 mg by mouth as needed.  . meloxicam (MOBIC) 7.5 MG tablet Take 7.5 mg by mouth daily as needed for pain.   . naproxen (NAPROSYN) 500 MG tablet Take 1 tablet (500 mg total) by mouth 2 (two) times daily.  Marland Kitchen omeprazole (PRILOSEC) 20 MG capsule Take 20 mg by mouth every 12 (twelve) hours.   . polyethylene glycol (MIRALAX / GLYCOLAX) packet Take 17 g by mouth daily as needed for mild constipation or moderate constipation.  . pravastatin (PRAVACHOL) 20 MG tablet Take 20 mg by mouth at bedtime.   Marland Kitchen Propylene Glycol (SYSTANE BALANCE)  0.6 % SOLN Apply 1 drop to eye daily.   . rizatriptan (MAXALT) 10 MG tablet Take 10 mg by mouth as needed for migraine. May repeat in 2 hours if needed  . tamsulosin (FLOMAX) 0.4 MG CAPS capsule Take 1 capsule (0.4 mg total) by mouth daily after supper.  . traMADol (ULTRAM) 50 MG tablet Take 1 tablet (50 mg total) by mouth every 6 (six) hours as needed.  . traZODone (DESYREL) 50 MG tablet Take 50 mg by mouth at bedtime as needed for sleep.     Allergies  Allergen Reactions  . Duloxetine Other (See Comments)     INCREASED LFTs  . Acetic Acid-Oxyquinoline Other (See Comments)    FEM PH; caused (vaginal) bleeding  . Buprenorphine Hcl Itching, Nausea Only and Rash  . Codeine Nausea Only  . Imitrex [Sumatriptan] Hives    "get hot and hyperventilate"  . Morphine Rash  . Morphine And Related Itching, Nausea Only and Rash  . Pentazocine Other (See Comments)    Caused vaginal bleeding; was given to patient by OB-GYN for pain secondary to GYN issue  . Pentazocine Lactate Other (See Comments)    Caused vaginal bleeding; was given to patient by OB-GYN for pain secondary to GYN issue  . Sumatriptan Succinate Hives    Hyperventilation "get hot and hyperventilate"     Social History   Tobacco Use  . Smoking status: Former Research scientist (life sciences)  . Smokeless tobacco: Never Used  . Tobacco comment: quit smoking in 1992  Substance Use Topics  . Alcohol use: Yes    Alcohol/week: 0.0 oz    Comment: occ  . Drug use: No   Social History   Social History Narrative   Walks the dog exercise.  Has to stop breath while walking the dog.    Drinks about 3 caffeine drinks a week    Currently disabled after 8 back surgeries with spinal cord/injury    family history includes Aneurysm in her mother; Atrial fibrillation in her father; Cancer in her brother and father; Hypertension in her brother, brother, father, and sister; Stroke (age of onset: 50) in her mother.  Wt Readings from Last 3 Encounters:  12/28/17 185 lb 6.4 oz (84.1 kg)  12/24/17 183 lb 3 oz (83.1 kg)  07/05/17 182 lb (82.6 kg)    PHYSICAL EXAM BP 112/78   Pulse 88   Ht 5\' 6"  (1.676 m)   Wt 185 lb 6.4 oz (84.1 kg)   LMP 06/24/2012   BMI 29.92 kg/m  Physical Exam  Constitutional: She is oriented to person, place, and time. She appears well-developed and well-nourished. No distress.  Borderline obese but otherwise healthy-appearing  HENT:  Head: Normocephalic and atraumatic.  Mouth/Throat: No oropharyngeal exudate.  Eyes: Conjunctivae and EOM are normal. Pupils are equal, round, and reactive to light. No scleral icterus.  Neck: Normal range of motion. Neck supple. No JVD present.  Cardiovascular: Normal rate, regular rhythm, normal heart sounds and intact distal pulses.  No extrasystoles are present. PMI is not displaced. Exam reveals no gallop and no friction rub.  No murmur heard. Pulmonary/Chest: Effort normal. No respiratory distress. She has no wheezes. She has no rales. She exhibits no tenderness.  Abdominal: Soft. Bowel sounds are normal. She exhibits no distension. There is no tenderness. There is no rebound.  Musculoskeletal: Normal range of motion. She exhibits no edema or deformity.   Neurological: She is alert and oriented to person, place, and time. No cranial nerve deficit.  Skin:  Skin is warm and dry. No rash noted. No erythema.  Psychiatric: She has a normal mood and affect. Her behavior is normal. Judgment and thought content normal.  Nursing note and vitals reviewed.    Adult ECG Report  Rate: 68 ;  Rhythm: normal sinus rhythm and Normal axis, intervals and durations.;   Narrative Interpretation: Normal EKG   Other studies Reviewed: Additional studies/ records that were reviewed today include:  Recent Labs: from PCP clinic note  Na+ 139, K+ 4.5, Cl- 103, HCO3- 28 , BUN 18, Cr 1.1, Glu 92, Ca2+ 9.5; AST 13, ALT 11,  AlkP 56,    CBC: W 6.9, H/H 13.8/43.6, Plt 200   ASSESSMENT / PLAN: Problem List Items Addressed This Visit    Shortness of breath - at rest & with exertion - Primary    She is supposed be having PFTs done, but this was to be scheduled At [facility].  She does not intend to go through with this visit since she is already seeing a cardiologist today.  I will therefore order PFTs.  She does have the We will check a 2D echocardiogram to evaluate for regional wall motion normality, EF, filling pressures and right-sided pressures.  Also for any valve lesions although I hear nothing on exam. Also checking Myoview stress test to exclude ischemia.      Relevant Orders   ECHOCARDIOGRAM COMPLETE   Pulmonary function test   Essential hypertension (Chronic)    BP stable today on lisinopril/HCTZ. Based on results, may need to adjust      Atypical angina ([place]) - cannot exclude    Exertional dyspnea -- now progressively worsening.  Cannot exclude coronary ischemic symptom as Atypical Angina.  Plan: Lexiscan Myoview -- she does not think that she would be able to walk on a TM due to back issues. Check 2 D Echo to assess for Regional WMA      Relevant Orders   ECHOCARDIOGRAM COMPLETE   Pulmonary function test      Echo/Myoview &  PFTs  Current medicines are reviewed at length with the patient today. (+/- concerns) none The following changes have been made: none  Patient Instructions  No medication changes    TEST SCHEDULE AT [facility] has requested that you have an echocardiogram. Echocardiography is a painless test that uses sound waves to create images of your heart. It provides your doctor with information about the size and shape of your heart and how well your heart's chambers and valves are working. This procedure takes approximately one hour. There are no restrictions for this procedure.     SCHEDULE AT [facility] Your physician has recommended that you have a pulmonary function test. Pulmonary Function Tests are a group of tests that measure how well air moves in and out of your lungs.     Your physician recommends that you schedule a follow-up appointment in [place].    Studies Ordered:   Orders Placed This Encounter  Procedures  . ECHOCARDIOGRAM COMPLETE  . Pulmonary function test      Glenetta Hew, M.D., M.S. Interventional Cardiologist   Pager # [phone] Phone # [phone] [address] [place], [place] [address]   Thank you for choosing [facility] at [facility]!!

## 2017-12-28 NOTE — Patient Instructions (Signed)
No medication changes    TEST SCHEDULE AT [place] has requested that you have an echocardiogram. Echocardiography is a painless test that uses sound waves to create images of your heart. It provides your doctor with information about the size and shape of your heart and how well your heart's chambers and valves are working. This procedure takes approximately one hour. There are no restrictions for this procedure.     SCHEDULE AT [facility] Your physician has recommended that you have a pulmonary function test. Pulmonary Function Tests are a group of tests that measure how well air moves in and out of your lungs.     Your physician recommends that you schedule a follow-up appointment in [place].

## 2017-12-30 DIAGNOSIS — R3915 Urgency of urination: Secondary | ICD-10-CM | POA: Diagnosis not present

## 2017-12-30 DIAGNOSIS — R350 Frequency of micturition: Secondary | ICD-10-CM | POA: Diagnosis not present

## 2017-12-30 DIAGNOSIS — N201 Calculus of ureter: Secondary | ICD-10-CM | POA: Diagnosis not present

## 2018-01-04 ENCOUNTER — Ambulatory Visit (HOSPITAL_COMMUNITY)
Admission: RE | Admit: 2018-01-04 | Discharge: 2018-01-04 | Disposition: A | Discharge status: Home / Self Care | Payer: Medicare HMO | Source: Ambulatory Visit | Attending: Cardiology | Admitting: Cardiology

## 2018-01-04 DIAGNOSIS — R0602 Shortness of breath: Secondary | ICD-10-CM | POA: Diagnosis not present

## 2018-01-04 DIAGNOSIS — I208 Other forms of angina pectoris: Secondary | ICD-10-CM | POA: Insufficient documentation

## 2018-01-04 LAB — PULMONARY FUNCTION TEST
DL/VA % pred: 92 %
DL/VA: 4.65 ml/min/mmHg/L
DLCO COR % PRED: 68 %
DLCO COR: 18.59 ml/min/mmHg
DLCO UNC % PRED: 69 %
DLCO unc: 18.7 ml/min/mmHg
FEF 25-75 POST: 2.95 L/s
FEF 25-75 PRE: 2.91 L/s
FEF2575-%CHANGE-POST: 1 %
FEF2575-%PRED-POST: 122 %
FEF2575-%PRED-PRE: 121 %
FEV1-%Change-Post: 0 %
FEV1-%Pred-Post: 93 %
FEV1-%Pred-Pre: 94 %
FEV1-Post: 2.54 L
FEV1-Pre: 2.55 L
FEV1FVC-%Change-Post: 0 %
FEV1FVC-%Pred-Pre: 105 %
FEV6-%CHANGE-POST: -2 %
FEV6-%Pred-Post: 88 %
FEV6-%Pred-Pre: 91 %
FEV6-Post: 3.01 L
FEV6-Pre: 3.11 L
FEV6FVC-%Change-Post: -1 %
FEV6FVC-%Pred-Post: 101 %
FEV6FVC-%Pred-Pre: 102 %
FVC-%Change-Post: -1 %
FVC-%PRED-PRE: 88 %
FVC-%Pred-Post: 87 %
FVC-POST: 3.06 L
FVC-PRE: 3.11 L
POST FEV1/FVC RATIO: 83 %
PRE FEV1/FVC RATIO: 82 %
Post FEV6/FVC ratio: 98 %
Pre FEV6/FVC Ratio: 100 %
RV % pred: 77 %
RV: 1.64 L
TLC % pred: 90 %
TLC: 4.86 L

## 2018-01-04 MED ORDER — ALBUTEROL SULFATE (2.5 MG/3ML) 0.083% IN NEBU
2.5000 mg | INHALATION_SOLUTION | Freq: Once | RESPIRATORY_TRACT | Status: AC
  Administered 2018-01-04: 2.5 mg via RESPIRATORY_TRACT

## 2018-01-05 ENCOUNTER — Ambulatory Visit
Admission: RE | Admit: 2018-01-05 | Discharge: 2018-01-05 | Disposition: A | Discharge status: Home / Self Care | Payer: Medicare HMO | Source: Ambulatory Visit | Attending: Internal Medicine | Admitting: Internal Medicine

## 2018-01-05 ENCOUNTER — Other Ambulatory Visit: Payer: Medicare HMO

## 2018-01-05 DIAGNOSIS — R0602 Shortness of breath: Secondary | ICD-10-CM

## 2018-01-05 MED ORDER — IOPAMIDOL (ISOVUE-370) INJECTION 76%
75.0000 mL | Freq: Once | INTRAVENOUS | Status: AC | PRN
  Administered 2018-01-05: 75 mL via INTRAVENOUS

## 2018-01-06 ENCOUNTER — Other Ambulatory Visit: Payer: Self-pay

## 2018-01-06 ENCOUNTER — Ambulatory Visit (HOSPITAL_COMMUNITY): Payer: Medicare HMO | Attending: Cardiovascular Disease

## 2018-01-06 DIAGNOSIS — I25118 Atherosclerotic heart disease of native coronary artery with other forms of angina pectoris: Secondary | ICD-10-CM | POA: Diagnosis not present

## 2018-01-06 DIAGNOSIS — R0600 Dyspnea, unspecified: Secondary | ICD-10-CM | POA: Diagnosis present

## 2018-01-06 DIAGNOSIS — I340 Nonrheumatic mitral (valve) insufficiency: Secondary | ICD-10-CM | POA: Diagnosis not present

## 2018-01-06 DIAGNOSIS — I2510 Atherosclerotic heart disease of native coronary artery without angina pectoris: Secondary | ICD-10-CM | POA: Diagnosis present

## 2018-01-06 DIAGNOSIS — I119 Hypertensive heart disease without heart failure: Secondary | ICD-10-CM | POA: Diagnosis not present

## 2018-01-06 DIAGNOSIS — R0602 Shortness of breath: Secondary | ICD-10-CM | POA: Insufficient documentation

## 2018-01-06 DIAGNOSIS — E785 Hyperlipidemia, unspecified: Secondary | ICD-10-CM | POA: Diagnosis not present

## 2018-01-06 DIAGNOSIS — I208 Other forms of angina pectoris: Secondary | ICD-10-CM

## 2018-01-18 ENCOUNTER — Ambulatory Visit: Payer: Medicare HMO | Admitting: Women's Health

## 2018-01-18 ENCOUNTER — Encounter: Payer: Self-pay | Admitting: Women's Health

## 2018-01-18 VITALS — BP 118/76

## 2018-01-18 DIAGNOSIS — N200 Calculus of kidney: Secondary | ICD-10-CM | POA: Diagnosis not present

## 2018-01-18 DIAGNOSIS — N898 Other specified noninflammatory disorders of vagina: Secondary | ICD-10-CM

## 2018-01-18 LAB — WET PREP FOR TRICH, YEAST, CLUE

## 2018-01-18 NOTE — Progress Notes (Signed)
62 year old MWF presents with vaginal burning, irritation, discharge.   Has used estradiol vaginal cream but did run out and has not used for the past month.  Denies urinary symptoms, abdominal pain or fever.   Postmenopausal on no HRT, with no bleeding.     12/24/2017 severe left-sided abdominal/back pain ,  pain is now resolved.  Has had follow-up with urologist this week, kidney stone passed..  Primary care manages hypertension and hypothyroidism. Continues to have chronic back pain, which limits activity and exercise.  Exam: Appears well.  External genitalia extremely erythematous at introitus, atrophic, speculum exam vaginal walls atrophic scant discharge wet prep negative.  Bimanual no CMT or adnexal tenderness, states burning sensation mostly vaginal.  Vaginal atrophy  Plan: Resume vaginal estrogen, estradiol vaginal cream 0.02% apply vaginally 3 times weekly, [facility].  Continue vaginal lubricants with intercourse.  Instructed to call if continued symptoms or problems.   Aware of importance to continue  to drink plenty of water.

## 2018-01-18 NOTE — Patient Instructions (Signed)
Atrophic Vaginitis Atrophic vaginitis is a condition in which the tissues that line the vagina become dry and thin. This condition is most common in women who have stopped having regular menstrual periods (menopause). This usually starts when a woman is 45-62 years old. Estrogen helps to keep the vagina moist. It stimulates the vagina to produce a clear fluid that lubricates the vagina for sexual intercourse. This fluid also protects the vagina from infection. Lack of estrogen can cause the lining of the vagina to get thinner and dryer. The vagina may also shrink in size. It may become less elastic. Atrophic vaginitis tends to get worse over time as a woman's estrogen level drops. What are the causes? This condition is caused by the normal drop in estrogen that happens around the time of menopause. What increases the risk? Certain conditions or situations may lower a woman's estrogen level, which increases her risk of atrophic vaginitis. These include:  Taking medicine that blocks estrogen.  Having ovaries removed surgically.  Being treated for cancer with X-ray treatment (radiation) or medicines (chemotherapy).  Exercising very hard and often.  Having an eating disorder (anorexia).  Giving birth or breastfeeding.  Being over the age of 50.  Smoking.  What are the signs or symptoms? Symptoms of this condition include:  Pain, soreness, or bleeding during sexual intercourse (dyspareunia).  Vaginal burning, irritation, or itching.  Pain or bleeding during a vaginal examination using a speculum (pelvic exam).  Loss of interest in sexual activity.  Having burning pain when passing urine.  Vaginal discharge that is brown or yellow.  In some cases, there are no symptoms. How is this diagnosed? This condition is diagnosed with a medical history and physical exam. This will include a pelvic exam that checks whether the inside of your vagina appears pale, thin, or dry. Rarely, you may  also have other tests, including:  A urine test.  A test that checks the acid balance in your vaginal fluid (acid balance test).  How is this treated? Treatment for this condition may depend on the severity of your symptoms. Treatment may include:  Using an over-the-counter vaginal lubricant before you have sexual intercourse.  Using a long-acting vaginal moisturizer.  Using low-dose vaginal estrogen for moderate to severe symptoms that do not respond to other treatments. Options include creams, tablets, and inserts (vaginal rings). Before using vaginal estrogen, tell your health care provider if you have a history of: ? Breast cancer. ? Endometrial cancer. ? Blood clots.  Taking medicines. You may be able to take a daily pill for dyspareunia. Discuss all of the risks of this medicine with your health care provider. It is usually not recommended for women who have a family history or personal history of breast cancer.  If your symptoms are very mild and you are not sexually active, you may not need treatment. Follow these instructions at home:  Take medicines only as directed by your health care provider. Do not use herbal or alternative medicines unless your health care provider says that you can.  Use over-the-counter creams, lubricants, or moisturizers for dryness only as directed by your health care provider.  If your atrophic vaginitis is caused by menopause, discuss all of your menopausal symptoms and treatment options with your health care provider.  Do not douche.  Do not use products that can make your vagina dry. These include: ? Scented feminine sprays. ? Scented tampons. ? Scented soaps.  If it hurts to have sex, talk with your sexual   partner. Contact a health care provider if:  Your discharge looks different than normal.  Your vagina has an unusual smell.  You have new symptoms.  Your symptoms do not improve with treatment.  Your symptoms get worse. This  information is not intended to replace advice given to you by your health care provider. Make sure you discuss any questions you have with your health care provider. Document Released: 02/18/2015 Document Revised: 03/11/2016 Document Reviewed: 09/25/2014 Elsevier Interactive Patient Education  2018 [employer]  

## 2018-01-19 ENCOUNTER — Other Ambulatory Visit: Payer: Self-pay | Admitting: Women's Health

## 2018-01-19 MED ORDER — NONFORMULARY OR COMPOUNDED ITEM: 6 refills | Status: DC

## 2018-02-01 ENCOUNTER — Ambulatory Visit: Payer: Medicare HMO | Admitting: Cardiology

## 2018-02-06 ENCOUNTER — Telehealth: Payer: Self-pay | Admitting: *Deleted

## 2018-02-06 MED ORDER — LIDOCAINE 2 % EX GEL
1.0000 | CUTANEOUS | 0 refills | Status: DC | PRN
Start: 2018-02-06 — End: 2020-01-17

## 2018-02-06 NOTE — Telephone Encounter (Signed)
[facility] for refill, review to use sparingly

## 2018-02-06 NOTE — Telephone Encounter (Signed)
Pt informed, Rx called in.  

## 2018-02-06 NOTE — Telephone Encounter (Signed)
Patient called requesting Rx for lidocaine 2% to help with external burning, still uses the estradiol vaginal cream 0.02% twice weekly, states Dr.Fernandez prescribed Rx and she uses this on days she doesn't uses the estradiol cream. I did explain to patient you may not chose to refill this. Please advise

## 2018-02-22 ENCOUNTER — Encounter: Payer: Self-pay | Admitting: Cardiology

## 2018-02-22 ENCOUNTER — Ambulatory Visit: Payer: Medicare HMO | Admitting: Cardiology

## 2018-02-22 VITALS — BP 105/72 | HR 76 | Ht 66.0 in | Wt 186.0 lb

## 2018-02-22 DIAGNOSIS — I208 Other forms of angina pectoris: Secondary | ICD-10-CM | POA: Diagnosis not present

## 2018-02-22 DIAGNOSIS — I10 Essential (primary) hypertension: Secondary | ICD-10-CM | POA: Diagnosis not present

## 2018-02-22 DIAGNOSIS — R0602 Shortness of breath: Secondary | ICD-10-CM

## 2018-02-22 NOTE — Progress Notes (Signed)
PCP: Haywood Pao, MD  Clinic Note: Chief Complaint  Patient presents with  . Follow-up    Echo and PFT results  . Shortness of Breath    Still has mildly pending     HPI: [name] is a 62 y.o. female who is being seen today for follow-up evaluation of shortness of breath/"cannot catch my breath" at the request of Tisovec, Fransico Him, MD seen by Claud Kelp, FNP--   [name] was initially seen on December 28, 2017 --evaluated for shortness of breath with rest and exertion.  I ordered an echocardiogram and PFTs but also ordered a Myoview but apparently  this order did not go through.  Recent Hospitalizations: None  Studies Personally Reviewed - (if available, images/films reviewed: From Epic Chart or Care Everywhere)  2D Echo April 2019: EF 55-60%.  GR 1 DD.  Mild MR.  Otherwise normal.  PFTs 01/04/2018 : Interpretation: The FVC, FEV1, FEV1/FVC ratio and FEF25-75% are within normal limits. Patient effort was erratic, but the normal FEV1 indicates no significant obstruction. . Following administration of bronchodilators, there is no significant response. The reduced diffusing capacity indicates a mild loss of functional alveolar capillary surface.  Still waiting for Lexiscan Myoview  Interval History: [name] presents here today stating that overall her breathing has improved at rest, but she still notes exertional dyspnea.  She still notes exertional dyspnea doing things like routine walking and even making the bed.  But not simply doing dishes unless she is doing a lot of dishes.  She only gets short of breath when she first lies down, but is able to lie flat then afterwards.  No edema.  She is able to do more without getting dyspnea than she was when I initially saw her, but is still noticing the exertional dyspnea.  She is not having any chest tightness or pressure. She does indicate that she had put on quite a bit of weight over the last several months leading up to  this last evaluation.  She is definitely hoping to work on weight loss, but is a bit concerned with the safety of actually a moving forward with an exercise program.  No wheezing or coughing.  No recent illnesses, fevers chills. No rapid irregular heartbeats palpitations, syncope/near syncope or TIA/amaurosis fugax.  No claudication.   ROS: A comprehensive was performed. Review of Systems  Constitutional: Positive for malaise/fatigue.  HENT: Negative for nosebleeds.   Gastrointestinal: Negative for blood in stool and melena.  Genitourinary: Negative for hematuria.  Neurological: Positive for dizziness (Only when very short of breath) and headaches (migraine). Negative for weakness.   I have reviewed and (if needed) personally updated the patient's problem list, medications, allergies, past medical and surgical history, social and family history.   Past Medical History:  Diagnosis Date  . Arthritis   . DDD (degenerative disc disease), lumbar   . Diabetes mellitus 1-13   patient denies and says never made aware;  BORDERLINE DR. BALAN-diet controlled;   Marland Kitchen GERD (gastroesophageal reflux disease)   . Hepatitis    genotype 1b hepatitis C virus, monitored by [facility] infectious disease group  . History of bronchitis   . Hyperlipidemia   . Hypertension   . Spinal cord stimulator status    placed 2006  . Thyroid disease    takes synthroid    Past Surgical History:  Procedure Laterality Date  . Anterior cervical decompression and fusion with a reflex hyper plate  16/10/958  C4-C5 and C5-C6 , Dr. Maia Plan  . BREAST EXCISIONAL BIOPSY Left 1982  . BREAST SURGERY  1983   left breast cyst removed  . cervical fusion w/OASIS system infuse  05/13/2006   C4-C5 and C5-C6 and C6-7,  Dr. Maia Plan  . CHOLECYSTECTOMY  ?2002  . CHOLECYSTECTOMY  2002  . COLONOSCOPY    . ESOPHAGEAL DILATION     Dr. Amedeo Plenty  . KNEE SURGERY  06/2017  . LAPAROSCOPIC ENDOMETRIOSIS FULGURATION  1980's   Dr. Warnell Forester  . LIVER  BIOPSY  2002  . LOWER BACK SURGERY  OCT 2016  . LUMBAR DISC SURGERY  08/13/2003   Diskectomy/fusion L5-S1 rt., Dr. Maia Plan  . lumbar hemilaminectomy  01/1989   L5-S1 on right, Dr. Ardeen Jourdain  . NASAL SINUS SURGERY  10/30/2007   Left upper Sinus lift,  Dr. Mingo Amber  . SHOULDER ARTHROSCOPY WITH SUBACROMIAL DECOMPRESSION Right 12/06/2014   Procedure: SHOULDER ARTHROSCOPY WITH SUBACROMIAL DECOMPRESSION; EXCISION BONE SPUR;  Surgeon: Roseanne Kaufman, MD;  Location: [place];  Service: Orthopedics;  Laterality: Right;  . SHOULDER SURGERY Right 12/06/14   "cleaned up bone spurs"  . SHOULDER SURGERY Right FEB 2016  . SPINAL CORD STIMULATOR INSERTION  12/29/2004   leads placed along spine from T8 to T10, Dr. Maia Plan  . Spinal cord stimulator pulse generator  01/05/2005   placed in right upper buttocks, Dr. Maia Plan  . THYROID LOBECTOMY Right 02/13/2016  . THYROIDECTOMY Right 02/13/2016   Procedure: RIGHT THYROID LOBECTOMY WITH FROZEN SECTION ;  Surgeon: Izora Gala, MD;  Location: [place];  Service: ENT;  Laterality: Right;  . TONSILLECTOMY      Current Meds  Medication Sig  . buPROPion (WELLBUTRIN XL) 150 MG 24 hr tablet Take 1 tablet by mouth daily.  . cyclobenzaprine (FLEXERIL) 10 MG tablet Take 10 mg by mouth as needed.  Marland Kitchen FLUoxetine (PROZAC) 40 MG capsule Take 40 mg by mouth daily.   . fluticasone (FLONASE) 50 MCG/ACT nasal spray Place 2 sprays into the nose daily as needed for allergies.   Marland Kitchen gabapentin (NEURONTIN) 300 MG capsule Take 1 capsule by mouth daily.  Marland Kitchen levothyroxine (SYNTHROID, LEVOTHROID) 125 MCG tablet Take 1 tablet by mouth daily with breakfast.  . Lidocaine 2 % GEL Apply 1 application topically as needed.  Marland Kitchen lisinopril-hydrochlorothiazide (PRINZIDE,ZESTORETIC) 20-12.5 MG per tablet Take 1 tablet by mouth every morning.   Marland Kitchen LORazepam (ATIVAN) 2 MG tablet Take 2 mg by mouth as needed.  . meloxicam (MOBIC) 7.5 MG tablet Take 7.5 mg by mouth daily as needed for pain.   .  NONFORMULARY OR COMPOUNDED ITEM Estradiol vaginal cream 0.02%  Apply 2-3 times weekly  . omeprazole (PRILOSEC) 20 MG capsule Take 20 mg by mouth every 12 (twelve) hours.   . polyethylene glycol (MIRALAX / GLYCOLAX) packet Take 17 g by mouth daily as needed for mild constipation or moderate constipation.  . pravastatin (PRAVACHOL) 20 MG tablet Take 20 mg by mouth at bedtime.   Marland Kitchen Propylene Glycol (SYSTANE BALANCE) 0.6 % SOLN Apply 1 drop to eye daily.   . rizatriptan (MAXALT) 10 MG tablet Take 10 mg by mouth as needed for migraine. May repeat in 2 hours if needed  . traMADol (ULTRAM) 50 MG tablet Take 1 tablet (50 mg total) by mouth every 6 (six) hours as needed.  . traZODone (DESYREL) 50 MG tablet Take 50 mg by mouth at bedtime as needed for sleep.     Allergies  Allergen Reactions  . Duloxetine Other (See Comments)     INCREASED LFTs  . Acetic Acid-Oxyquinoline Other (See Comments)    FEM PH; caused (vaginal) bleeding  . Buprenorphine Hcl Itching, Nausea Only and Rash  . Codeine Nausea Only  . Imitrex [Sumatriptan] Hives    "get hot and hyperventilate"  . Morphine Rash  . Morphine And Related Itching, Nausea Only and Rash  . Pentazocine Other (See Comments)    Caused vaginal bleeding; was given to patient by OB-GYN for pain secondary to GYN issue  . Pentazocine Lactate Other (See Comments)    Caused vaginal bleeding; was given to patient by OB-GYN for pain secondary to GYN issue  . Sumatriptan Succinate Hives    Hyperventilation "get hot and hyperventilate"    Social History   Tobacco Use  . Smoking status: Former Research scientist (life sciences)  . Smokeless tobacco: Never Used  . Tobacco comment: quit smoking in 1992  Substance Use Topics  . Alcohol use: Yes    Alcohol/week: 0.0 oz    Comment: occ  . Drug use: No   Social History   Social History Narrative   Walks the dog exercise.  Has to stop breath while walking the dog.    Drinks about 3 caffeine drinks a week    Currently disabled after  8 back surgeries with spinal cord/injury    family history includes Aneurysm in her mother; Atrial fibrillation in her father; Cancer in her brother and father; Hypertension in her brother, brother, father, and sister; Stroke (age of onset: 46) in her mother.  Wt Readings from Last 3 Encounters:  02/22/18 186 lb (84.4 kg)  12/28/17 185 lb 6.4 oz (84.1 kg)  12/24/17 183 lb 3 oz (83.1 kg)    PHYSICAL EXAM BP 105/72   Pulse 76   Ht 5\' 6"  (1.676 m)   Wt 186 lb (84.4 kg)   LMP 06/24/2012   BMI 30.02 kg/m  Physical Exam  Constitutional: She is oriented to person, place, and time. She appears well-developed and well-nourished. No distress.  Borderline obese but otherwise healthy-appearing  HENT:  Head: Normocephalic and atraumatic.  Neck: Normal range of motion. Neck supple. No JVD present.  Cardiovascular: Normal rate, regular rhythm, normal heart sounds and intact distal pulses.  No extrasystoles are present. PMI is not displaced. Exam reveals no gallop and no friction rub.  No murmur heard. Pulmonary/Chest: Effort normal and breath sounds normal. No respiratory distress. She has no wheezes. She has no rales. She exhibits no tenderness.  Musculoskeletal: Normal range of motion. She exhibits no edema.  Neurological: She is alert and oriented to person, place, and time.  Psychiatric: She has a normal mood and affect. Her behavior is normal. Judgment and thought content normal.  Nursing note and vitals reviewed.    Adult ECG Report n/a  Other studies Reviewed: Additional studies/ records that were reviewed today include:  Recent Labs: from PCP clinic note  Na+ 139, K+ 4.5, Cl- 103, HCO3- 28 , BUN 18, Cr 1.1, Glu 92, Ca2+ 9.5; AST 13, ALT 11,  AlkP 56,    CBC: W 6.9, H/H 13.8/43.6, Plt 200   ASSESSMENT / PLAN: Problem List Items Addressed This Visit    Shortness of breath - at rest & with exertion - Primary    PFTs and echocardiogram appeared relatively normal.  Next step is  to evaluate for ischemia with a Myoview stress test.  (Not likely able to walk on treadmill)  Relevant Orders   MYOCARDIAL PERFUSION IMAGING   Essential hypertension (Chronic)    Normal blood pressure today.      Relevant Orders   MYOCARDIAL PERFUSION IMAGING   Atypical angina ([facility]) - cannot exclude    Still noting exertional dyspnea with normal echocardiogram and relative normal PFTs.  Still exclude ischemic etiology. Plan: Proceed with plan to check Lexiscan Myoview.  (She previously indicated that she would not be a walk on treadmill due to back issues.)      Relevant Orders   MYOCARDIAL PERFUSION IMAGING      Echo/Myoview & PFTs  Current medicines are reviewed at length with the patient today. (+/- concerns) none The following changes have been made: none  Patient Instructions  Medication Instructions:  Your physician recommends that you continue on your current medications as directed. Please refer to the Current Medication list given to you today.  Labwork: none  Testing/Procedures: Your physician has requested that you have a lexiscan myoview. For further information please visit [email or web address]. Please follow instruction sheet, as given.  Follow-Up: PENDING THE RESULTS OF THE LEXISCAN  Any Other Special Instructions Will Be Listed Below (If Applicable).  If you need a refill on your cardiac medications before your next appointment, please call your pharmacy.    Studies Ordered:   Orders Placed This Encounter  Procedures  . MYOCARDIAL PERFUSION IMAGING      Glenetta Hew, M.D., M.S. Interventional Cardiologist   Pager # [phone] Phone # [phone] [address]. [place], [place] [address]   Thank you for choosing [facility] at [facility]!!

## 2018-02-22 NOTE — Patient Instructions (Signed)
Medication Instructions:  Your physician recommends that you continue on your current medications as directed. Please refer to the Current Medication list given to you today.  Labwork: none  Testing/Procedures: Your physician has requested that you have a lexiscan myoview. For further information please visit [email or web address]. Please follow instruction sheet, as given.  Follow-Up: PENDING THE RESULTS OF THE LEXISCAN  Any Other Special Instructions Will Be Listed Below (If Applicable).  If you need a refill on your cardiac medications before your next appointment, please call your pharmacy.

## 2018-02-23 ENCOUNTER — Encounter: Payer: Self-pay | Admitting: Cardiology

## 2018-02-23 NOTE — Assessment & Plan Note (Signed)
She has gained some weight which could be contributing to her exertional resting dyspnea.  She is hoping to try to get exercise and plans to adjust her diet.  Proceed with exercise program pending results of stress test.

## 2018-02-23 NOTE — Assessment & Plan Note (Signed)
Normal blood pressure today.

## 2018-02-23 NOTE — Assessment & Plan Note (Addendum)
PFTs and echocardiogram appeared relatively normal.  Next step is to evaluate for ischemia with a Myoview stress test.  (Not likely able to walk on treadmill)

## 2018-02-23 NOTE — Assessment & Plan Note (Signed)
Still noting exertional dyspnea with normal echocardiogram and relative normal PFTs.  Still exclude ischemic etiology. Plan: Proceed with plan to check Lexiscan Myoview.  (She previously indicated that she would not be a walk on treadmill due to back issues.)

## 2018-02-28 DIAGNOSIS — M47816 Spondylosis without myelopathy or radiculopathy, lumbar region: Secondary | ICD-10-CM | POA: Diagnosis not present

## 2018-02-28 DIAGNOSIS — M461 Sacroiliitis, not elsewhere classified: Secondary | ICD-10-CM | POA: Diagnosis not present

## 2018-03-01 DIAGNOSIS — E038 Other specified hypothyroidism: Secondary | ICD-10-CM | POA: Diagnosis not present

## 2018-03-01 DIAGNOSIS — M859 Disorder of bone density and structure, unspecified: Secondary | ICD-10-CM | POA: Diagnosis not present

## 2018-03-01 DIAGNOSIS — E7800 Pure hypercholesterolemia, unspecified: Secondary | ICD-10-CM | POA: Diagnosis not present

## 2018-03-01 DIAGNOSIS — R82998 Other abnormal findings in urine: Secondary | ICD-10-CM | POA: Diagnosis not present

## 2018-03-01 DIAGNOSIS — N183 Chronic kidney disease, stage 3 (moderate): Secondary | ICD-10-CM | POA: Diagnosis not present

## 2018-03-02 ENCOUNTER — Telehealth (HOSPITAL_COMMUNITY): Payer: Self-pay

## 2018-03-02 NOTE — Telephone Encounter (Signed)
Encounter complete. 

## 2018-03-07 ENCOUNTER — Ambulatory Visit (HOSPITAL_COMMUNITY)
Admission: RE | Admit: 2018-03-07 | Discharge: 2018-03-07 | Disposition: A | Discharge status: Home / Self Care | Payer: Medicare HMO | Source: Ambulatory Visit | Attending: Cardiovascular Disease | Admitting: Cardiovascular Disease

## 2018-03-07 DIAGNOSIS — I208 Other forms of angina pectoris: Secondary | ICD-10-CM | POA: Diagnosis not present

## 2018-03-07 DIAGNOSIS — R0602 Shortness of breath: Secondary | ICD-10-CM

## 2018-03-07 DIAGNOSIS — I10 Essential (primary) hypertension: Secondary | ICD-10-CM | POA: Diagnosis not present

## 2018-03-07 LAB — MYOCARDIAL PERFUSION IMAGING
LVDIAVOL: 65 mL (ref 46–106)
LVSYSVOL: 19 mL
Peak HR: 90 {beats}/min
Rest HR: 68 {beats}/min
SDS: 0
SRS: 0
SSS: 0
TID: 0.97

## 2018-03-07 MED ORDER — TECHNETIUM TC 99M TETROFOSMIN IV KIT
28.4000 | PACK | Freq: Once | INTRAVENOUS | Status: AC | PRN
  Administered 2018-03-07: 28.4 via INTRAVENOUS
  Filled 2018-03-07: qty 29

## 2018-03-07 MED ORDER — TECHNETIUM TC 99M TETROFOSMIN IV KIT
10.4000 | PACK | Freq: Once | INTRAVENOUS | Status: AC | PRN
  Administered 2018-03-07: 10.4 via INTRAVENOUS
  Filled 2018-03-07: qty 11

## 2018-03-07 MED ORDER — REGADENOSON 0.4 MG/5ML IV SOLN
0.4000 mg | Freq: Once | INTRAVENOUS | Status: AC
  Administered 2018-03-07: 0.4 mg via INTRAVENOUS

## 2018-03-08 DIAGNOSIS — F411 Generalized anxiety disorder: Secondary | ICD-10-CM | POA: Diagnosis not present

## 2018-03-08 DIAGNOSIS — M859 Disorder of bone density and structure, unspecified: Secondary | ICD-10-CM | POA: Diagnosis not present

## 2018-03-08 DIAGNOSIS — E063 Autoimmune thyroiditis: Secondary | ICD-10-CM | POA: Diagnosis not present

## 2018-03-08 DIAGNOSIS — N183 Chronic kidney disease, stage 3 (moderate): Secondary | ICD-10-CM | POA: Diagnosis not present

## 2018-03-08 DIAGNOSIS — K589 Irritable bowel syndrome without diarrhea: Secondary | ICD-10-CM | POA: Diagnosis not present

## 2018-03-08 DIAGNOSIS — E038 Other specified hypothyroidism: Secondary | ICD-10-CM | POA: Diagnosis not present

## 2018-03-08 DIAGNOSIS — Z0000 Encounter for general adult medical examination without abnormal findings: Secondary | ICD-10-CM | POA: Diagnosis not present

## 2018-03-08 DIAGNOSIS — R808 Other proteinuria: Secondary | ICD-10-CM | POA: Diagnosis not present

## 2018-03-08 DIAGNOSIS — E7800 Pure hypercholesterolemia, unspecified: Secondary | ICD-10-CM | POA: Diagnosis not present

## 2018-03-10 DIAGNOSIS — Z1212 Encounter for screening for malignant neoplasm of rectum: Secondary | ICD-10-CM | POA: Diagnosis not present

## 2018-03-27 ENCOUNTER — Encounter: Payer: Medicare HMO | Admitting: Women's Health

## 2018-03-27 DIAGNOSIS — H2513 Age-related nuclear cataract, bilateral: Secondary | ICD-10-CM | POA: Diagnosis not present

## 2018-03-27 DIAGNOSIS — I10 Essential (primary) hypertension: Secondary | ICD-10-CM | POA: Diagnosis not present

## 2018-03-27 DIAGNOSIS — H532 Diplopia: Secondary | ICD-10-CM | POA: Diagnosis not present

## 2018-03-28 DIAGNOSIS — H532 Diplopia: Secondary | ICD-10-CM | POA: Diagnosis not present

## 2018-04-11 ENCOUNTER — Other Ambulatory Visit (HOSPITAL_COMMUNITY): Payer: Self-pay | Admitting: Respiratory Therapy

## 2018-04-11 DIAGNOSIS — F331 Major depressive disorder, recurrent, moderate: Secondary | ICD-10-CM | POA: Diagnosis not present

## 2018-04-11 DIAGNOSIS — F411 Generalized anxiety disorder: Secondary | ICD-10-CM | POA: Diagnosis not present

## 2018-04-11 DIAGNOSIS — R0602 Shortness of breath: Secondary | ICD-10-CM

## 2018-05-02 DIAGNOSIS — F411 Generalized anxiety disorder: Secondary | ICD-10-CM | POA: Diagnosis not present

## 2018-05-02 DIAGNOSIS — F331 Major depressive disorder, recurrent, moderate: Secondary | ICD-10-CM | POA: Diagnosis not present

## 2018-05-17 ENCOUNTER — Other Ambulatory Visit: Payer: Self-pay | Admitting: Nurse Practitioner

## 2018-05-17 ENCOUNTER — Ambulatory Visit
Admission: RE | Admit: 2018-05-17 | Discharge: 2018-05-17 | Disposition: A | Discharge status: Home / Self Care | Payer: Medicare HMO | Source: Ambulatory Visit | Attending: Nurse Practitioner | Admitting: Nurse Practitioner

## 2018-05-17 DIAGNOSIS — M542 Cervicalgia: Secondary | ICD-10-CM

## 2018-05-17 DIAGNOSIS — M47812 Spondylosis without myelopathy or radiculopathy, cervical region: Secondary | ICD-10-CM

## 2018-05-23 ENCOUNTER — Other Ambulatory Visit: Payer: Self-pay | Admitting: Nurse Practitioner

## 2018-05-23 DIAGNOSIS — M542 Cervicalgia: Secondary | ICD-10-CM

## 2018-05-25 ENCOUNTER — Ambulatory Visit
Admission: RE | Admit: 2018-05-25 | Discharge: 2018-05-25 | Disposition: A | Discharge status: Home / Self Care | Payer: Medicare HMO | Source: Ambulatory Visit | Attending: Nurse Practitioner | Admitting: Nurse Practitioner

## 2018-05-25 DIAGNOSIS — M542 Cervicalgia: Secondary | ICD-10-CM

## 2018-05-25 DIAGNOSIS — M4802 Spinal stenosis, cervical region: Secondary | ICD-10-CM | POA: Diagnosis not present

## 2018-05-30 DIAGNOSIS — M4302 Spondylolysis, cervical region: Secondary | ICD-10-CM | POA: Diagnosis not present

## 2018-05-31 DIAGNOSIS — F411 Generalized anxiety disorder: Secondary | ICD-10-CM | POA: Diagnosis not present

## 2018-05-31 DIAGNOSIS — F331 Major depressive disorder, recurrent, moderate: Secondary | ICD-10-CM | POA: Diagnosis not present

## 2018-06-14 DIAGNOSIS — L308 Other specified dermatitis: Secondary | ICD-10-CM | POA: Diagnosis not present

## 2018-06-26 ENCOUNTER — Ambulatory Visit: Payer: Medicare HMO | Admitting: Women's Health

## 2018-06-26 ENCOUNTER — Encounter: Payer: Self-pay | Admitting: Women's Health

## 2018-06-26 VITALS — BP 122/80

## 2018-06-26 DIAGNOSIS — R300 Dysuria: Secondary | ICD-10-CM

## 2018-06-26 DIAGNOSIS — N949 Unspecified condition associated with female genital organs and menstrual cycle: Secondary | ICD-10-CM | POA: Diagnosis not present

## 2018-06-26 LAB — WET PREP FOR TRICH, YEAST, CLUE

## 2018-06-26 NOTE — Progress Notes (Signed)
62 year old MWF G1, P1 postmenopausal on vaginal estrogen only with no bleeding presents with complaint of vaginal burning, increased urinary frequency with slight burning at initiation of stream for the past few days.  Had increased vaginal burning with vaginal estradiol 2 days ago.  Leaving for vacation in a few days and wanted to make sure no UTI or vaginal infection.  Denies abdominal pain, nausea, changes in GI or fever.  On disability for chronic back pain limits activity and exercise.  Primary care manages hypertension, hypothyroidism, depression, and hypercholesteremia.  Exam: Appears well.  No CVAT.  External genitalia extremely erythematous and introitus, atrophic, speculum exam vaginal walls atrophic scant discharge, no odor wet prep negative.  Bimanual no CMT or tenderness. UA: Negative nitrites, leukocytes or blood, no WBCs, no bacteria  Atrophic vaginitis/vaginal burning   Plan: Reviewed normality of wet prep and UA.  Will use over-the-counter A&D ointment externally, loose clothing, instructed to call if continued problems.

## 2018-06-27 LAB — URINALYSIS, COMPLETE W/RFL CULTURE
BACTERIA UA: NONE SEEN /HPF
Bilirubin Urine: NEGATIVE
Glucose, UA: NEGATIVE
HGB URINE DIPSTICK: NEGATIVE
HYALINE CAST: NONE SEEN /LPF
Ketones, ur: NEGATIVE
Leukocyte Esterase: NEGATIVE
Nitrites, Initial: NEGATIVE
PH: 6 (ref 5.0–8.0)
Protein, ur: NEGATIVE
RBC / HPF: NONE SEEN /HPF (ref 0–2)
SPECIFIC GRAVITY, URINE: 1.01 (ref 1.001–1.03)
WBC, UA: NONE SEEN /HPF (ref 0–5)

## 2018-06-27 LAB — NO CULTURE INDICATED

## 2018-07-22 DIAGNOSIS — Z23 Encounter for immunization: Secondary | ICD-10-CM | POA: Diagnosis not present

## 2018-08-23 DIAGNOSIS — F331 Major depressive disorder, recurrent, moderate: Secondary | ICD-10-CM | POA: Diagnosis not present

## 2018-08-29 ENCOUNTER — Other Ambulatory Visit: Payer: Self-pay | Admitting: Neurosurgery

## 2018-08-29 DIAGNOSIS — M4302 Spondylolysis, cervical region: Secondary | ICD-10-CM

## 2018-09-06 ENCOUNTER — Ambulatory Visit
Admission: RE | Admit: 2018-09-06 | Discharge: 2018-09-06 | Disposition: A | Discharge status: Home / Self Care | Payer: Medicare HMO | Source: Ambulatory Visit | Attending: Neurosurgery | Admitting: Neurosurgery

## 2018-09-06 DIAGNOSIS — M542 Cervicalgia: Secondary | ICD-10-CM | POA: Diagnosis not present

## 2018-09-06 DIAGNOSIS — M4302 Spondylolysis, cervical region: Secondary | ICD-10-CM

## 2018-09-06 MED ORDER — DIAZEPAM 5 MG PO TABS: 5.0000 mg | ORAL_TABLET | Freq: Once | ORAL | Status: DC

## 2018-09-06 MED ORDER — DIAZEPAM 5 MG PO TABS
10.0000 mg | ORAL_TABLET | Freq: Once | ORAL | Status: AC
  Administered 2018-09-06: 10 mg via ORAL

## 2018-09-06 MED ORDER — IOPAMIDOL (ISOVUE-M 300) INJECTION 61%
1.0000 mL | Freq: Once | INTRAMUSCULAR | Status: AC | PRN
  Administered 2018-09-06: 1 mL via EPIDURAL

## 2018-09-06 MED ORDER — TRIAMCINOLONE ACETONIDE 40 MG/ML IJ SUSP (RADIOLOGY)
60.0000 mg | Freq: Once | INTRAMUSCULAR | Status: AC
  Administered 2018-09-06: 60 mg via EPIDURAL

## 2018-09-07 DIAGNOSIS — I10 Essential (primary) hypertension: Secondary | ICD-10-CM | POA: Diagnosis not present

## 2018-09-07 DIAGNOSIS — N183 Chronic kidney disease, stage 3 (moderate): Secondary | ICD-10-CM | POA: Diagnosis not present

## 2018-09-07 DIAGNOSIS — R808 Other proteinuria: Secondary | ICD-10-CM | POA: Diagnosis not present

## 2018-09-07 DIAGNOSIS — E7800 Pure hypercholesterolemia, unspecified: Secondary | ICD-10-CM | POA: Diagnosis not present

## 2018-09-07 DIAGNOSIS — M1990 Unspecified osteoarthritis, unspecified site: Secondary | ICD-10-CM | POA: Diagnosis not present

## 2018-09-07 DIAGNOSIS — E038 Other specified hypothyroidism: Secondary | ICD-10-CM | POA: Diagnosis not present

## 2018-09-07 DIAGNOSIS — G4731 Primary central sleep apnea: Secondary | ICD-10-CM | POA: Diagnosis not present

## 2018-09-07 DIAGNOSIS — E063 Autoimmune thyroiditis: Secondary | ICD-10-CM | POA: Diagnosis not present

## 2018-09-07 DIAGNOSIS — M859 Disorder of bone density and structure, unspecified: Secondary | ICD-10-CM | POA: Diagnosis not present

## 2018-09-08 DIAGNOSIS — F331 Major depressive disorder, recurrent, moderate: Secondary | ICD-10-CM | POA: Diagnosis not present

## 2018-09-12 DIAGNOSIS — M47816 Spondylosis without myelopathy or radiculopathy, lumbar region: Secondary | ICD-10-CM | POA: Diagnosis not present

## 2018-09-12 DIAGNOSIS — M4302 Spondylolysis, cervical region: Secondary | ICD-10-CM | POA: Diagnosis not present

## 2018-10-26 DIAGNOSIS — R1013 Epigastric pain: Secondary | ICD-10-CM | POA: Diagnosis not present

## 2018-10-26 DIAGNOSIS — Z6831 Body mass index (BMI) 31.0-31.9, adult: Secondary | ICD-10-CM | POA: Diagnosis not present

## 2018-10-26 DIAGNOSIS — K219 Gastro-esophageal reflux disease without esophagitis: Secondary | ICD-10-CM | POA: Diagnosis not present

## 2018-10-26 DIAGNOSIS — K5900 Constipation, unspecified: Secondary | ICD-10-CM | POA: Diagnosis not present

## 2018-12-13 DIAGNOSIS — M4302 Spondylolysis, cervical region: Secondary | ICD-10-CM | POA: Diagnosis not present

## 2018-12-20 DIAGNOSIS — M1712 Unilateral primary osteoarthritis, left knee: Secondary | ICD-10-CM | POA: Diagnosis not present

## 2019-01-11 DIAGNOSIS — M5442 Lumbago with sciatica, left side: Secondary | ICD-10-CM | POA: Diagnosis not present

## 2019-01-11 DIAGNOSIS — M47816 Spondylosis without myelopathy or radiculopathy, lumbar region: Secondary | ICD-10-CM | POA: Diagnosis not present

## 2019-01-11 DIAGNOSIS — M542 Cervicalgia: Secondary | ICD-10-CM | POA: Diagnosis not present

## 2019-01-11 DIAGNOSIS — M5441 Lumbago with sciatica, right side: Secondary | ICD-10-CM | POA: Diagnosis not present

## 2019-01-11 DIAGNOSIS — M5126 Other intervertebral disc displacement, lumbar region: Secondary | ICD-10-CM | POA: Diagnosis not present

## 2019-01-11 DIAGNOSIS — M5020 Other cervical disc displacement, unspecified cervical region: Secondary | ICD-10-CM | POA: Diagnosis not present

## 2019-01-11 DIAGNOSIS — M961 Postlaminectomy syndrome, not elsewhere classified: Secondary | ICD-10-CM | POA: Diagnosis not present

## 2019-01-11 DIAGNOSIS — G8929 Other chronic pain: Secondary | ICD-10-CM | POA: Diagnosis not present

## 2019-01-11 DIAGNOSIS — F1120 Opioid dependence, uncomplicated: Secondary | ICD-10-CM | POA: Diagnosis not present

## 2019-03-27 DIAGNOSIS — M5126 Other intervertebral disc displacement, lumbar region: Secondary | ICD-10-CM | POA: Diagnosis not present

## 2019-03-27 DIAGNOSIS — M7918 Myalgia, other site: Secondary | ICD-10-CM | POA: Diagnosis not present

## 2019-03-27 DIAGNOSIS — M542 Cervicalgia: Secondary | ICD-10-CM | POA: Diagnosis not present

## 2019-03-27 DIAGNOSIS — M961 Postlaminectomy syndrome, not elsewhere classified: Secondary | ICD-10-CM | POA: Diagnosis not present

## 2019-03-27 DIAGNOSIS — F411 Generalized anxiety disorder: Secondary | ICD-10-CM | POA: Diagnosis not present

## 2019-03-27 DIAGNOSIS — M5020 Other cervical disc displacement, unspecified cervical region: Secondary | ICD-10-CM | POA: Diagnosis not present

## 2019-03-27 DIAGNOSIS — M47816 Spondylosis without myelopathy or radiculopathy, lumbar region: Secondary | ICD-10-CM | POA: Diagnosis not present

## 2019-03-27 DIAGNOSIS — M5442 Lumbago with sciatica, left side: Secondary | ICD-10-CM | POA: Diagnosis not present

## 2019-03-27 DIAGNOSIS — M5441 Lumbago with sciatica, right side: Secondary | ICD-10-CM | POA: Diagnosis not present

## 2019-03-28 DIAGNOSIS — M859 Disorder of bone density and structure, unspecified: Secondary | ICD-10-CM | POA: Diagnosis not present

## 2019-03-28 DIAGNOSIS — E038 Other specified hypothyroidism: Secondary | ICD-10-CM | POA: Diagnosis not present

## 2019-03-28 DIAGNOSIS — E7800 Pure hypercholesterolemia, unspecified: Secondary | ICD-10-CM | POA: Diagnosis not present

## 2019-03-28 DIAGNOSIS — I10 Essential (primary) hypertension: Secondary | ICD-10-CM | POA: Diagnosis not present

## 2019-03-29 DIAGNOSIS — M25512 Pain in left shoulder: Secondary | ICD-10-CM | POA: Diagnosis not present

## 2019-03-29 DIAGNOSIS — G8929 Other chronic pain: Secondary | ICD-10-CM | POA: Diagnosis not present

## 2019-04-03 DIAGNOSIS — R82998 Other abnormal findings in urine: Secondary | ICD-10-CM | POA: Diagnosis not present

## 2019-04-03 DIAGNOSIS — I10 Essential (primary) hypertension: Secondary | ICD-10-CM | POA: Diagnosis not present

## 2019-04-04 DIAGNOSIS — Z1339 Encounter for screening examination for other mental health and behavioral disorders: Secondary | ICD-10-CM | POA: Diagnosis not present

## 2019-04-04 DIAGNOSIS — F331 Major depressive disorder, recurrent, moderate: Secondary | ICD-10-CM | POA: Diagnosis not present

## 2019-04-04 DIAGNOSIS — F411 Generalized anxiety disorder: Secondary | ICD-10-CM | POA: Diagnosis not present

## 2019-04-04 DIAGNOSIS — Z1331 Encounter for screening for depression: Secondary | ICD-10-CM | POA: Diagnosis not present

## 2019-04-04 DIAGNOSIS — G4731 Primary central sleep apnea: Secondary | ICD-10-CM | POA: Diagnosis not present

## 2019-04-04 DIAGNOSIS — E063 Autoimmune thyroiditis: Secondary | ICD-10-CM | POA: Diagnosis not present

## 2019-04-04 DIAGNOSIS — K219 Gastro-esophageal reflux disease without esophagitis: Secondary | ICD-10-CM | POA: Diagnosis not present

## 2019-04-04 DIAGNOSIS — B182 Chronic viral hepatitis C: Secondary | ICD-10-CM | POA: Diagnosis not present

## 2019-04-04 DIAGNOSIS — E039 Hypothyroidism, unspecified: Secondary | ICD-10-CM | POA: Diagnosis not present

## 2019-04-04 DIAGNOSIS — Z0000 Encounter for general adult medical examination without abnormal findings: Secondary | ICD-10-CM | POA: Diagnosis not present

## 2019-04-04 DIAGNOSIS — M1990 Unspecified osteoarthritis, unspecified site: Secondary | ICD-10-CM | POA: Diagnosis not present

## 2019-05-10 ENCOUNTER — Other Ambulatory Visit: Payer: Self-pay | Admitting: Orthopedic Surgery

## 2019-05-10 DIAGNOSIS — R52 Pain, unspecified: Secondary | ICD-10-CM

## 2019-05-17 DIAGNOSIS — K635 Polyp of colon: Secondary | ICD-10-CM | POA: Diagnosis not present

## 2019-05-17 DIAGNOSIS — K5730 Diverticulosis of large intestine without perforation or abscess without bleeding: Secondary | ICD-10-CM | POA: Diagnosis not present

## 2019-05-17 DIAGNOSIS — Z800 Family history of malignant neoplasm of digestive organs: Secondary | ICD-10-CM | POA: Diagnosis not present

## 2019-05-17 DIAGNOSIS — D123 Benign neoplasm of transverse colon: Secondary | ICD-10-CM | POA: Diagnosis not present

## 2019-05-17 DIAGNOSIS — Z86010 Personal history of colonic polyps: Secondary | ICD-10-CM | POA: Diagnosis not present

## 2019-05-17 DIAGNOSIS — D122 Benign neoplasm of ascending colon: Secondary | ICD-10-CM | POA: Diagnosis not present

## 2019-05-22 DIAGNOSIS — K635 Polyp of colon: Secondary | ICD-10-CM | POA: Diagnosis not present

## 2019-05-22 DIAGNOSIS — D122 Benign neoplasm of ascending colon: Secondary | ICD-10-CM | POA: Diagnosis not present

## 2019-05-22 DIAGNOSIS — D123 Benign neoplasm of transverse colon: Secondary | ICD-10-CM | POA: Diagnosis not present

## 2019-05-24 ENCOUNTER — Ambulatory Visit
Admission: RE | Admit: 2019-05-24 | Discharge: 2019-05-24 | Disposition: A | Discharge status: Home / Self Care | Payer: Medicare HMO | Source: Ambulatory Visit | Attending: Orthopedic Surgery | Admitting: Orthopedic Surgery

## 2019-05-24 DIAGNOSIS — M25512 Pain in left shoulder: Secondary | ICD-10-CM | POA: Diagnosis not present

## 2019-05-24 DIAGNOSIS — R52 Pain, unspecified: Secondary | ICD-10-CM

## 2019-05-24 MED ORDER — IOPAMIDOL (ISOVUE-M 200) INJECTION 41%
15.0000 mL | Freq: Once | INTRAMUSCULAR | Status: AC
  Administered 2019-05-24: 15 mL via INTRA_ARTICULAR

## 2019-05-28 DIAGNOSIS — M75122 Complete rotator cuff tear or rupture of left shoulder, not specified as traumatic: Secondary | ICD-10-CM | POA: Diagnosis not present

## 2019-06-20 DIAGNOSIS — M94212 Chondromalacia, left shoulder: Secondary | ICD-10-CM | POA: Diagnosis not present

## 2019-06-20 DIAGNOSIS — M19012 Primary osteoarthritis, left shoulder: Secondary | ICD-10-CM | POA: Diagnosis not present

## 2019-06-20 DIAGNOSIS — M75112 Incomplete rotator cuff tear or rupture of left shoulder, not specified as traumatic: Secondary | ICD-10-CM | POA: Diagnosis not present

## 2019-06-20 DIAGNOSIS — G8918 Other acute postprocedural pain: Secondary | ICD-10-CM | POA: Diagnosis not present

## 2019-06-20 DIAGNOSIS — M75122 Complete rotator cuff tear or rupture of left shoulder, not specified as traumatic: Secondary | ICD-10-CM | POA: Diagnosis not present

## 2019-06-20 DIAGNOSIS — M7542 Impingement syndrome of left shoulder: Secondary | ICD-10-CM | POA: Diagnosis not present

## 2019-06-20 DIAGNOSIS — M19042 Primary osteoarthritis, left hand: Secondary | ICD-10-CM | POA: Diagnosis not present

## 2019-06-20 DIAGNOSIS — M24112 Other articular cartilage disorders, left shoulder: Secondary | ICD-10-CM | POA: Diagnosis not present

## 2019-06-26 DIAGNOSIS — G8929 Other chronic pain: Secondary | ICD-10-CM | POA: Diagnosis not present

## 2019-06-26 DIAGNOSIS — M25612 Stiffness of left shoulder, not elsewhere classified: Secondary | ICD-10-CM | POA: Diagnosis not present

## 2019-06-26 DIAGNOSIS — M5020 Other cervical disc displacement, unspecified cervical region: Secondary | ICD-10-CM | POA: Diagnosis not present

## 2019-06-26 DIAGNOSIS — M25512 Pain in left shoulder: Secondary | ICD-10-CM | POA: Diagnosis not present

## 2019-06-26 DIAGNOSIS — M542 Cervicalgia: Secondary | ICD-10-CM | POA: Diagnosis not present

## 2019-06-26 DIAGNOSIS — R29898 Other symptoms and signs involving the musculoskeletal system: Secondary | ICD-10-CM | POA: Diagnosis not present

## 2019-06-26 DIAGNOSIS — M7918 Myalgia, other site: Secondary | ICD-10-CM | POA: Diagnosis not present

## 2019-06-26 DIAGNOSIS — Z9889 Other specified postprocedural states: Secondary | ICD-10-CM | POA: Insufficient documentation

## 2019-06-26 DIAGNOSIS — R6889 Other general symptoms and signs: Secondary | ICD-10-CM | POA: Diagnosis not present

## 2019-06-26 DIAGNOSIS — F411 Generalized anxiety disorder: Secondary | ICD-10-CM | POA: Diagnosis not present

## 2019-06-26 DIAGNOSIS — M47816 Spondylosis without myelopathy or radiculopathy, lumbar region: Secondary | ICD-10-CM | POA: Diagnosis not present

## 2019-06-26 DIAGNOSIS — M5442 Lumbago with sciatica, left side: Secondary | ICD-10-CM | POA: Diagnosis not present

## 2019-06-26 DIAGNOSIS — M5126 Other intervertebral disc displacement, lumbar region: Secondary | ICD-10-CM | POA: Diagnosis not present

## 2019-06-26 DIAGNOSIS — M5441 Lumbago with sciatica, right side: Secondary | ICD-10-CM | POA: Diagnosis not present

## 2019-06-26 DIAGNOSIS — M961 Postlaminectomy syndrome, not elsewhere classified: Secondary | ICD-10-CM | POA: Diagnosis not present

## 2019-07-03 DIAGNOSIS — R29898 Other symptoms and signs involving the musculoskeletal system: Secondary | ICD-10-CM | POA: Diagnosis not present

## 2019-07-03 DIAGNOSIS — G8929 Other chronic pain: Secondary | ICD-10-CM | POA: Diagnosis not present

## 2019-07-03 DIAGNOSIS — R6889 Other general symptoms and signs: Secondary | ICD-10-CM | POA: Diagnosis not present

## 2019-07-03 DIAGNOSIS — M25512 Pain in left shoulder: Secondary | ICD-10-CM | POA: Diagnosis not present

## 2019-07-03 DIAGNOSIS — M25612 Stiffness of left shoulder, not elsewhere classified: Secondary | ICD-10-CM | POA: Diagnosis not present

## 2019-07-03 DIAGNOSIS — Z9889 Other specified postprocedural states: Secondary | ICD-10-CM | POA: Diagnosis not present

## 2019-07-09 DIAGNOSIS — R29898 Other symptoms and signs involving the musculoskeletal system: Secondary | ICD-10-CM | POA: Diagnosis not present

## 2019-07-09 DIAGNOSIS — R6889 Other general symptoms and signs: Secondary | ICD-10-CM | POA: Diagnosis not present

## 2019-07-09 DIAGNOSIS — Z9889 Other specified postprocedural states: Secondary | ICD-10-CM | POA: Diagnosis not present

## 2019-07-09 DIAGNOSIS — M25612 Stiffness of left shoulder, not elsewhere classified: Secondary | ICD-10-CM | POA: Diagnosis not present

## 2019-07-20 DIAGNOSIS — R6889 Other general symptoms and signs: Secondary | ICD-10-CM | POA: Diagnosis not present

## 2019-07-20 DIAGNOSIS — M25612 Stiffness of left shoulder, not elsewhere classified: Secondary | ICD-10-CM | POA: Diagnosis not present

## 2019-07-20 DIAGNOSIS — M25512 Pain in left shoulder: Secondary | ICD-10-CM | POA: Diagnosis not present

## 2019-07-20 DIAGNOSIS — R29898 Other symptoms and signs involving the musculoskeletal system: Secondary | ICD-10-CM | POA: Diagnosis not present

## 2019-07-20 DIAGNOSIS — Z9889 Other specified postprocedural states: Secondary | ICD-10-CM | POA: Diagnosis not present

## 2019-07-27 DIAGNOSIS — M25612 Stiffness of left shoulder, not elsewhere classified: Secondary | ICD-10-CM | POA: Diagnosis not present

## 2019-07-27 DIAGNOSIS — R29898 Other symptoms and signs involving the musculoskeletal system: Secondary | ICD-10-CM | POA: Diagnosis not present

## 2019-07-27 DIAGNOSIS — Z9889 Other specified postprocedural states: Secondary | ICD-10-CM | POA: Diagnosis not present

## 2019-07-27 DIAGNOSIS — R6889 Other general symptoms and signs: Secondary | ICD-10-CM | POA: Diagnosis not present

## 2019-08-10 DIAGNOSIS — Z9889 Other specified postprocedural states: Secondary | ICD-10-CM | POA: Diagnosis not present

## 2019-08-10 DIAGNOSIS — M25512 Pain in left shoulder: Secondary | ICD-10-CM | POA: Diagnosis not present

## 2019-08-10 DIAGNOSIS — R6889 Other general symptoms and signs: Secondary | ICD-10-CM | POA: Diagnosis not present

## 2019-08-10 DIAGNOSIS — R29898 Other symptoms and signs involving the musculoskeletal system: Secondary | ICD-10-CM | POA: Diagnosis not present

## 2019-08-10 DIAGNOSIS — M25562 Pain in left knee: Secondary | ICD-10-CM | POA: Diagnosis not present

## 2019-08-10 DIAGNOSIS — M25612 Stiffness of left shoulder, not elsewhere classified: Secondary | ICD-10-CM | POA: Diagnosis not present

## 2019-08-17 DIAGNOSIS — R6889 Other general symptoms and signs: Secondary | ICD-10-CM | POA: Diagnosis not present

## 2019-08-17 DIAGNOSIS — R29898 Other symptoms and signs involving the musculoskeletal system: Secondary | ICD-10-CM | POA: Diagnosis not present

## 2019-08-17 DIAGNOSIS — M25612 Stiffness of left shoulder, not elsewhere classified: Secondary | ICD-10-CM | POA: Diagnosis not present

## 2019-08-17 DIAGNOSIS — Z9889 Other specified postprocedural states: Secondary | ICD-10-CM | POA: Diagnosis not present

## 2019-08-23 DIAGNOSIS — R29898 Other symptoms and signs involving the musculoskeletal system: Secondary | ICD-10-CM | POA: Diagnosis not present

## 2019-08-23 DIAGNOSIS — R6889 Other general symptoms and signs: Secondary | ICD-10-CM | POA: Diagnosis not present

## 2019-08-23 DIAGNOSIS — M25612 Stiffness of left shoulder, not elsewhere classified: Secondary | ICD-10-CM | POA: Diagnosis not present

## 2019-08-23 DIAGNOSIS — Z9889 Other specified postprocedural states: Secondary | ICD-10-CM | POA: Diagnosis not present

## 2019-08-31 DIAGNOSIS — M25512 Pain in left shoulder: Secondary | ICD-10-CM | POA: Diagnosis not present

## 2019-08-31 DIAGNOSIS — Z9889 Other specified postprocedural states: Secondary | ICD-10-CM | POA: Diagnosis not present

## 2019-08-31 DIAGNOSIS — R6889 Other general symptoms and signs: Secondary | ICD-10-CM | POA: Diagnosis not present

## 2019-08-31 DIAGNOSIS — R29898 Other symptoms and signs involving the musculoskeletal system: Secondary | ICD-10-CM | POA: Diagnosis not present

## 2019-08-31 DIAGNOSIS — M25612 Stiffness of left shoulder, not elsewhere classified: Secondary | ICD-10-CM | POA: Diagnosis not present

## 2019-09-07 DIAGNOSIS — M25612 Stiffness of left shoulder, not elsewhere classified: Secondary | ICD-10-CM | POA: Diagnosis not present

## 2019-09-07 DIAGNOSIS — Z9889 Other specified postprocedural states: Secondary | ICD-10-CM | POA: Diagnosis not present

## 2019-09-07 DIAGNOSIS — R29898 Other symptoms and signs involving the musculoskeletal system: Secondary | ICD-10-CM | POA: Diagnosis not present

## 2019-09-07 DIAGNOSIS — R6889 Other general symptoms and signs: Secondary | ICD-10-CM | POA: Diagnosis not present

## 2019-09-27 DIAGNOSIS — M25612 Stiffness of left shoulder, not elsewhere classified: Secondary | ICD-10-CM | POA: Diagnosis not present

## 2019-09-27 DIAGNOSIS — Z9889 Other specified postprocedural states: Secondary | ICD-10-CM | POA: Diagnosis not present

## 2019-10-02 DIAGNOSIS — Z1212 Encounter for screening for malignant neoplasm of rectum: Secondary | ICD-10-CM | POA: Diagnosis not present

## 2019-10-04 DIAGNOSIS — B182 Chronic viral hepatitis C: Secondary | ICD-10-CM | POA: Diagnosis not present

## 2019-10-04 DIAGNOSIS — N1831 Chronic kidney disease, stage 3a: Secondary | ICD-10-CM | POA: Diagnosis not present

## 2019-10-04 DIAGNOSIS — I120 Hypertensive chronic kidney disease with stage 5 chronic kidney disease or end stage renal disease: Secondary | ICD-10-CM | POA: Diagnosis not present

## 2019-10-04 DIAGNOSIS — K589 Irritable bowel syndrome without diarrhea: Secondary | ICD-10-CM | POA: Diagnosis not present

## 2019-10-04 DIAGNOSIS — E7800 Pure hypercholesterolemia, unspecified: Secondary | ICD-10-CM | POA: Diagnosis not present

## 2019-10-04 DIAGNOSIS — D126 Benign neoplasm of colon, unspecified: Secondary | ICD-10-CM | POA: Diagnosis not present

## 2019-10-04 DIAGNOSIS — E039 Hypothyroidism, unspecified: Secondary | ICD-10-CM | POA: Diagnosis not present

## 2019-10-04 DIAGNOSIS — F411 Generalized anxiety disorder: Secondary | ICD-10-CM | POA: Diagnosis not present

## 2019-10-04 DIAGNOSIS — G4731 Primary central sleep apnea: Secondary | ICD-10-CM | POA: Diagnosis not present

## 2019-10-22 ENCOUNTER — Other Ambulatory Visit: Payer: Self-pay | Admitting: Orthopedic Surgery

## 2019-10-22 DIAGNOSIS — M25512 Pain in left shoulder: Secondary | ICD-10-CM

## 2019-10-23 DIAGNOSIS — M25612 Stiffness of left shoulder, not elsewhere classified: Secondary | ICD-10-CM | POA: Diagnosis not present

## 2019-10-23 DIAGNOSIS — M7918 Myalgia, other site: Secondary | ICD-10-CM | POA: Diagnosis not present

## 2019-10-23 DIAGNOSIS — R29898 Other symptoms and signs involving the musculoskeletal system: Secondary | ICD-10-CM | POA: Diagnosis not present

## 2019-10-23 DIAGNOSIS — M25512 Pain in left shoulder: Secondary | ICD-10-CM | POA: Diagnosis not present

## 2019-10-23 DIAGNOSIS — Z9889 Other specified postprocedural states: Secondary | ICD-10-CM | POA: Diagnosis not present

## 2019-10-23 DIAGNOSIS — M898X8 Other specified disorders of bone, other site: Secondary | ICD-10-CM | POA: Diagnosis not present

## 2019-10-23 DIAGNOSIS — F1120 Opioid dependence, uncomplicated: Secondary | ICD-10-CM | POA: Diagnosis not present

## 2019-10-23 DIAGNOSIS — G8929 Other chronic pain: Secondary | ICD-10-CM | POA: Diagnosis not present

## 2019-10-23 DIAGNOSIS — Z79899 Other long term (current) drug therapy: Secondary | ICD-10-CM | POA: Diagnosis not present

## 2019-10-23 DIAGNOSIS — F411 Generalized anxiety disorder: Secondary | ICD-10-CM | POA: Diagnosis not present

## 2019-10-23 DIAGNOSIS — M47816 Spondylosis without myelopathy or radiculopathy, lumbar region: Secondary | ICD-10-CM | POA: Diagnosis not present

## 2019-10-23 DIAGNOSIS — R6889 Other general symptoms and signs: Secondary | ICD-10-CM | POA: Diagnosis not present

## 2019-10-23 DIAGNOSIS — M961 Postlaminectomy syndrome, not elsewhere classified: Secondary | ICD-10-CM | POA: Diagnosis not present

## 2019-10-31 ENCOUNTER — Other Ambulatory Visit: Payer: Self-pay

## 2019-10-31 ENCOUNTER — Ambulatory Visit
Admission: RE | Admit: 2019-10-31 | Discharge: 2019-10-31 | Disposition: A | Discharge status: Home / Self Care | Payer: Medicare HMO | Source: Ambulatory Visit | Attending: Orthopedic Surgery | Admitting: Orthopedic Surgery

## 2019-10-31 DIAGNOSIS — M25512 Pain in left shoulder: Secondary | ICD-10-CM

## 2019-10-31 MED ORDER — IOPAMIDOL (ISOVUE-M 200) INJECTION 41%
15.0000 mL | Freq: Once | INTRAMUSCULAR | Status: AC
  Administered 2019-10-31: 15 mL via INTRA_ARTICULAR

## 2019-11-01 DIAGNOSIS — S43432A Superior glenoid labrum lesion of left shoulder, initial encounter: Secondary | ICD-10-CM | POA: Diagnosis not present

## 2019-11-01 DIAGNOSIS — Z9889 Other specified postprocedural states: Secondary | ICD-10-CM | POA: Diagnosis not present

## 2019-11-14 ENCOUNTER — Other Ambulatory Visit: Payer: Self-pay | Admitting: Internal Medicine

## 2019-11-14 DIAGNOSIS — Z1231 Encounter for screening mammogram for malignant neoplasm of breast: Secondary | ICD-10-CM

## 2019-11-15 DIAGNOSIS — M25512 Pain in left shoulder: Secondary | ICD-10-CM | POA: Diagnosis not present

## 2019-11-15 DIAGNOSIS — M5105 Intervertebral disc disorders with myelopathy, thoracolumbar region: Secondary | ICD-10-CM | POA: Insufficient documentation

## 2019-11-15 DIAGNOSIS — M2352 Chronic instability of knee, left knee: Secondary | ICD-10-CM | POA: Diagnosis not present

## 2019-11-15 DIAGNOSIS — G8929 Other chronic pain: Secondary | ICD-10-CM | POA: Diagnosis not present

## 2019-11-15 DIAGNOSIS — Z9889 Other specified postprocedural states: Secondary | ICD-10-CM | POA: Diagnosis not present

## 2019-11-15 DIAGNOSIS — S43432D Superior glenoid labrum lesion of left shoulder, subsequent encounter: Secondary | ICD-10-CM | POA: Diagnosis not present

## 2019-11-15 DIAGNOSIS — M47816 Spondylosis without myelopathy or radiculopathy, lumbar region: Secondary | ICD-10-CM | POA: Insufficient documentation

## 2019-11-15 DIAGNOSIS — M25562 Pain in left knee: Secondary | ICD-10-CM | POA: Diagnosis not present

## 2019-11-20 DIAGNOSIS — M5126 Other intervertebral disc displacement, lumbar region: Secondary | ICD-10-CM | POA: Diagnosis not present

## 2019-11-20 DIAGNOSIS — M7918 Myalgia, other site: Secondary | ICD-10-CM | POA: Diagnosis not present

## 2019-11-20 DIAGNOSIS — F1120 Opioid dependence, uncomplicated: Secondary | ICD-10-CM | POA: Diagnosis not present

## 2019-11-20 DIAGNOSIS — M5020 Other cervical disc displacement, unspecified cervical region: Secondary | ICD-10-CM | POA: Diagnosis not present

## 2019-11-20 DIAGNOSIS — Z79899 Other long term (current) drug therapy: Secondary | ICD-10-CM | POA: Diagnosis not present

## 2019-11-20 DIAGNOSIS — M47816 Spondylosis without myelopathy or radiculopathy, lumbar region: Secondary | ICD-10-CM | POA: Diagnosis not present

## 2019-11-20 DIAGNOSIS — F411 Generalized anxiety disorder: Secondary | ICD-10-CM | POA: Diagnosis not present

## 2019-11-20 DIAGNOSIS — M25512 Pain in left shoulder: Secondary | ICD-10-CM | POA: Diagnosis not present

## 2019-11-20 DIAGNOSIS — M961 Postlaminectomy syndrome, not elsewhere classified: Secondary | ICD-10-CM | POA: Diagnosis not present

## 2019-12-19 DIAGNOSIS — M67912 Unspecified disorder of synovium and tendon, left shoulder: Secondary | ICD-10-CM | POA: Diagnosis not present

## 2019-12-21 ENCOUNTER — Telehealth: Payer: Self-pay | Admitting: *Deleted

## 2019-12-21 MED ORDER — NONFORMULARY OR COMPOUNDED ITEM: 0 refills | Status: DC

## 2019-12-21 NOTE — Telephone Encounter (Signed)
Patient called requesting refill on compound estradiol 0.02% ,annual exam scheduled on 01/22/20. Rx called in for 30 day supply.

## 2019-12-24 ENCOUNTER — Other Ambulatory Visit: Payer: Self-pay

## 2019-12-24 ENCOUNTER — Ambulatory Visit
Admission: RE | Admit: 2019-12-24 | Discharge: 2019-12-24 | Disposition: A | Discharge status: Home / Self Care | Payer: Medicare HMO | Source: Ambulatory Visit | Attending: Internal Medicine | Admitting: Internal Medicine

## 2019-12-24 DIAGNOSIS — Z1231 Encounter for screening mammogram for malignant neoplasm of breast: Secondary | ICD-10-CM | POA: Diagnosis not present

## 2019-12-31 DIAGNOSIS — M94212 Chondromalacia, left shoulder: Secondary | ICD-10-CM | POA: Diagnosis not present

## 2019-12-31 DIAGNOSIS — M7522 Bicipital tendinitis, left shoulder: Secondary | ICD-10-CM | POA: Diagnosis not present

## 2019-12-31 DIAGNOSIS — G8918 Other acute postprocedural pain: Secondary | ICD-10-CM | POA: Diagnosis not present

## 2019-12-31 DIAGNOSIS — M65812 Other synovitis and tenosynovitis, left shoulder: Secondary | ICD-10-CM | POA: Diagnosis not present

## 2019-12-31 DIAGNOSIS — M7502 Adhesive capsulitis of left shoulder: Secondary | ICD-10-CM | POA: Diagnosis not present

## 2020-01-04 DIAGNOSIS — K5900 Constipation, unspecified: Secondary | ICD-10-CM | POA: Diagnosis not present

## 2020-01-09 DIAGNOSIS — M25612 Stiffness of left shoulder, not elsewhere classified: Secondary | ICD-10-CM | POA: Diagnosis not present

## 2020-01-09 DIAGNOSIS — M25312 Other instability, left shoulder: Secondary | ICD-10-CM | POA: Diagnosis not present

## 2020-01-09 DIAGNOSIS — M7502 Adhesive capsulitis of left shoulder: Secondary | ICD-10-CM | POA: Diagnosis not present

## 2020-01-09 DIAGNOSIS — M6281 Muscle weakness (generalized): Secondary | ICD-10-CM | POA: Diagnosis not present

## 2020-01-11 ENCOUNTER — Ambulatory Visit: Payer: Medicare HMO | Attending: Internal Medicine

## 2020-01-11 DIAGNOSIS — M25312 Other instability, left shoulder: Secondary | ICD-10-CM | POA: Diagnosis not present

## 2020-01-11 DIAGNOSIS — M25612 Stiffness of left shoulder, not elsewhere classified: Secondary | ICD-10-CM | POA: Diagnosis not present

## 2020-01-11 DIAGNOSIS — M7502 Adhesive capsulitis of left shoulder: Secondary | ICD-10-CM | POA: Diagnosis not present

## 2020-01-11 DIAGNOSIS — M6281 Muscle weakness (generalized): Secondary | ICD-10-CM | POA: Diagnosis not present

## 2020-01-11 DIAGNOSIS — Z23 Encounter for immunization: Secondary | ICD-10-CM

## 2020-01-11 NOTE — Progress Notes (Signed)
   Covid-19 Vaccination Clinic  Name:  [name]    MRN: [id number] DOB: 02-Nov-1955  01/11/2020  [name] was observed post Covid-19 immunization for 15 minutes without incident. She was provided with Vaccine Information Sheet and instruction to access the V-Safe system.   [name] was instructed to call 911 with any severe reactions post vaccine: Marland Kitchen Difficulty breathing  . Swelling of face and throat  . A fast heartbeat  . A bad rash all over body  . Dizziness and weakness   Immunizations Administered    Name Date Dose VIS Date Route   Pfizer COVID-19 Vaccine 01/11/2020  9:33 AM 0.3 mL 09/28/2019 Intramuscular   Manufacturer: [place]   Lot: [id number]   [place]: [id number]

## 2020-01-14 DIAGNOSIS — M25612 Stiffness of left shoulder, not elsewhere classified: Secondary | ICD-10-CM | POA: Diagnosis not present

## 2020-01-14 DIAGNOSIS — M25312 Other instability, left shoulder: Secondary | ICD-10-CM | POA: Diagnosis not present

## 2020-01-14 DIAGNOSIS — M6281 Muscle weakness (generalized): Secondary | ICD-10-CM | POA: Diagnosis not present

## 2020-01-14 DIAGNOSIS — M7502 Adhesive capsulitis of left shoulder: Secondary | ICD-10-CM | POA: Diagnosis not present

## 2020-01-17 ENCOUNTER — Emergency Department (HOSPITAL_COMMUNITY)
Admission: EM | Admit: 2020-01-17 | Discharge: 2020-01-17 | Disposition: A | Discharge status: Home / Self Care | Payer: Medicare HMO | Attending: Emergency Medicine | Admitting: Emergency Medicine

## 2020-01-17 ENCOUNTER — Emergency Department (HOSPITAL_COMMUNITY): Payer: Medicare HMO

## 2020-01-17 ENCOUNTER — Other Ambulatory Visit: Payer: Self-pay

## 2020-01-17 ENCOUNTER — Encounter (HOSPITAL_COMMUNITY): Payer: Self-pay | Admitting: Emergency Medicine

## 2020-01-17 DIAGNOSIS — I10 Essential (primary) hypertension: Secondary | ICD-10-CM | POA: Diagnosis not present

## 2020-01-17 DIAGNOSIS — R0902 Hypoxemia: Secondary | ICD-10-CM | POA: Diagnosis not present

## 2020-01-17 DIAGNOSIS — Z87891 Personal history of nicotine dependence: Secondary | ICD-10-CM | POA: Diagnosis not present

## 2020-01-17 DIAGNOSIS — R519 Headache, unspecified: Secondary | ICD-10-CM | POA: Diagnosis not present

## 2020-01-17 DIAGNOSIS — I129 Hypertensive chronic kidney disease with stage 1 through stage 4 chronic kidney disease, or unspecified chronic kidney disease: Secondary | ICD-10-CM | POA: Insufficient documentation

## 2020-01-17 DIAGNOSIS — N183 Chronic kidney disease, stage 3 unspecified: Secondary | ICD-10-CM | POA: Diagnosis not present

## 2020-01-17 DIAGNOSIS — R413 Other amnesia: Secondary | ICD-10-CM

## 2020-01-17 DIAGNOSIS — R4182 Altered mental status, unspecified: Secondary | ICD-10-CM | POA: Diagnosis present

## 2020-01-17 DIAGNOSIS — Z79899 Other long term (current) drug therapy: Secondary | ICD-10-CM | POA: Insufficient documentation

## 2020-01-17 DIAGNOSIS — G454 Transient global amnesia: Secondary | ICD-10-CM | POA: Insufficient documentation

## 2020-01-17 DIAGNOSIS — R410 Disorientation, unspecified: Secondary | ICD-10-CM | POA: Diagnosis not present

## 2020-01-17 LAB — CBC WITH DIFFERENTIAL/PLATELET
Abs Immature Granulocytes: 0.02 10*3/uL (ref 0.00–0.07)
Basophils Absolute: 0 10*3/uL (ref 0.0–0.1)
Basophils Relative: 1 %
Eosinophils Absolute: 0.1 10*3/uL (ref 0.0–0.5)
Eosinophils Relative: 2 %
HCT: 44 % (ref 36.0–46.0)
Hemoglobin: 13.9 g/dL (ref 12.0–15.0)
Immature Granulocytes: 0 %
Lymphocytes Relative: 32 %
Lymphs Abs: 1.9 10*3/uL (ref 0.7–4.0)
MCH: 27.8 pg (ref 26.0–34.0)
MCHC: 31.6 g/dL (ref 30.0–36.0)
MCV: 88 fL (ref 80.0–100.0)
Monocytes Absolute: 0.6 10*3/uL (ref 0.1–1.0)
Monocytes Relative: 10 %
Neutro Abs: 3.3 10*3/uL (ref 1.7–7.7)
Neutrophils Relative %: 55 %
Platelets: 242 10*3/uL (ref 150–400)
RBC: 5 MIL/uL (ref 3.87–5.11)
RDW: 14 % (ref 11.5–15.5)
WBC: 6 10*3/uL (ref 4.0–10.5)
nRBC: 0 % (ref 0.0–0.2)

## 2020-01-17 LAB — COMPREHENSIVE METABOLIC PANEL
ALT: 27 U/L (ref 0–44)
AST: 20 U/L (ref 15–41)
Albumin: 3.7 g/dL (ref 3.5–5.0)
Alkaline Phosphatase: 56 U/L (ref 38–126)
Anion gap: 13 (ref 5–15)
BUN: 10 mg/dL (ref 8–23)
CO2: 24 mmol/L (ref 22–32)
Calcium: 9.1 mg/dL (ref 8.9–10.3)
Chloride: 102 mmol/L (ref 98–111)
Creatinine, Ser: 1.08 mg/dL — ABNORMAL HIGH (ref 0.44–1.00)
GFR calc Af Amer: >60 mL/min (ref >60)
GFR calc non Af Amer: 55 mL/min — ABNORMAL LOW (ref >60)
Glucose, Bld: 100 mg/dL — ABNORMAL HIGH (ref 70–99)
Potassium: 4 mmol/L (ref 3.5–5.1)
Sodium: 139 mmol/L (ref 135–145)
Total Bilirubin: 0.7 mg/dL (ref 0.3–1.2)
Total Protein: 5.9 g/dL — ABNORMAL LOW (ref 6.5–8.1)

## 2020-01-17 LAB — URINALYSIS, ROUTINE W REFLEX MICROSCOPIC
Bilirubin Urine: NEGATIVE
Glucose, UA: NEGATIVE mg/dL
Hgb urine dipstick: NEGATIVE
Ketones, ur: NEGATIVE mg/dL
Leukocytes,Ua: NEGATIVE
Nitrite: NEGATIVE
Protein, ur: NEGATIVE mg/dL
Specific Gravity, Urine: 1.003 — ABNORMAL LOW (ref 1.005–1.030)
pH: 6 (ref 5.0–8.0)

## 2020-01-17 LAB — CBG MONITORING, ED: Glucose-Capillary: 95 mg/dL (ref 70–99)

## 2020-01-17 MED ORDER — DIPHENHYDRAMINE HCL 50 MG/ML IJ SOLN
25.0000 mg | Freq: Once | INTRAMUSCULAR | Status: AC
  Administered 2020-01-17: 25 mg via INTRAVENOUS
  Filled 2020-01-17: qty 1

## 2020-01-17 MED ORDER — METOCLOPRAMIDE HCL 5 MG/ML IJ SOLN
10.0000 mg | Freq: Once | INTRAMUSCULAR | Status: AC
  Administered 2020-01-17: 10 mg via INTRAVENOUS
  Filled 2020-01-17: qty 2

## 2020-01-17 NOTE — Discharge Instructions (Signed)
Please read and follow all provided instructions.  Your diagnoses today include:  1. Transient global amnesia     Tests performed today include:  Blood work and urine test - normal  CT of the head -no problems or strokes  EEG to check for seizure activity -no seizure activity  Vital signs. See below for your results today.   Medications prescribed:   None  Take any prescribed medications only as directed.  Home care instructions:  Follow any educational materials contained in this packet.  BE VERY CAREFUL not to take multiple medicines containing Tylenol (also called acetaminophen). Doing so can lead to an overdose which can damage your liver and cause liver failure and possibly death.   Follow-up instructions: Please follow-up with your primary care provider in the next 3 days for further evaluation of your symptoms.   Return instructions:   Please return to the Emergency Department if you experience worsening symptoms.   Return if you have weakness in your arms or legs, slurred speech, trouble walking or talking, confusion, or trouble with your balance.   Please return if you have any other emergent concerns.  Additional Information:  Your vital signs today were: BP 127/88   Pulse 85   Temp 98 F (36.7 C) (Oral)   Resp 17   Ht 5\' 6"  (1.676 m)   Wt 83.5 kg   LMP 06/24/2012   SpO2 95%   BMI 29.70 kg/m  If your blood pressure (BP) was elevated above 135/85 this visit, please have this repeated by your doctor within one month. --------------

## 2020-01-17 NOTE — ED Triage Notes (Signed)
Pt arrives via EMS- around 9am pt started having repetitive questions, not remembering events. Pt states she remembers getting out of the shower this morning, but does not recall anything past that.  Pt was normal last night. Pt asking repetitive questions for EMS. No neuro deficits, no slurred speech, no weakness. BP 180/100, CBG 126, HR 70 Sinus rhythm

## 2020-01-17 NOTE — ED Notes (Signed)
Notified EEG that pt is available

## 2020-01-17 NOTE — ED Provider Notes (Signed)
3:44 PM signout from [facility] at shift change.  Patient here for confusion, headache, improving.  Head CT reassuring.  Previous provider spoke with neurology who recommended EEG.  Patient is currently awaiting reassessment and EEG results.  If continuing to improve and returning to baseline, normal EEG, cleared by neurology, suspect patient will build to go home.  BP 134/87   Pulse 74   Temp 98 F (36.7 C) (Oral)   Resp 16   Ht 5\' 6"  (1.676 m)   Wt 83.5 kg   LMP 06/24/2012   SpO2 96%   BMI 29.70 kg/m   6:24 PM Discussed with Dr. Cheral Marker. Reports that EEG is normal.  We discussed the patient's current forming new memories and can remember.  No indications for further work-up or admission at this time.  Encourage his PCP follow-up.  Suspect transient global amnesia.   Patient reports to me that her headache is improved.  She has had a little bit to eat and drink.  She would like to go home.  Patient counseled to return if they have weakness in their arms or legs, slurred speech, trouble walking or talking, confusion, trouble with their balance, or if they have any other concerns. Patient verbalizes understanding and agrees with plan.   BP 127/88   Pulse 85   Temp 98 F (36.7 C) (Oral)   Resp 17   Ht 5\' 6"  (1.676 m)   Wt 83.5 kg   LMP 06/24/2012   SpO2 95%   BMI 29.70 kg/m     Carlisle Cater, PA-C 01/17/20 1826    Davonna Belling, MD 01/18/20 0003

## 2020-01-17 NOTE — ED Provider Notes (Signed)
[facility]ERG[facility] DEPARTMENT Provider Note   CSN: [id number] Arrival date & time: 01/17/20  1252     History Chief Complaint  Patient presents with  . Altered Mental Status    [name] is a 64 y.o. female with a past medical history of hypertension, hyperlipidemia, GERD, diabetes, hypothyroidism presenting to the ED with a chief complaint of confusion.  Patient woke up around 8:30 AM this morning in her usual state of health. Believes she drank coffee and then went to take a shower around 9 AM.  When she got out of the shower, states that she was confused, unable to remember anything about her recent left shoulder surgery or anything that she did yesterday.  She called her daughter and daughter states that she was asking her "the same three questions about her surgery."  States that her symptoms have gradually resolved over the past 3 hours.  She began having a headache since arrival to the ED. Denies vision changes, vomiting, injuries or falls, numbness in arms or legs, chest pain, shortness of breath, neck pain or stiffness, fever, history of similar symptoms in the past.  HPI     Past Medical History:  Diagnosis Date  . Arthritis   . DDD (degenerative disc disease), lumbar   . Diabetes mellitus 1-13   patient denies and says never made aware;  BORDERLINE DR. BALAN-diet controlled;   Marland Kitchen GERD (gastroesophageal reflux disease)   . Hepatitis    genotype 1b hepatitis C virus, monitored by [facility] infectious disease group  . History of bronchitis   . Hyperlipidemia   . Hypertension   . Spinal cord stimulator status    placed 2006  . Thyroid disease    takes synthroid    Patient Active Problem List   Diagnosis Date Noted  . Shortness of breath - at rest & with exertion 12/28/2017  . Atypical angina ([place]) - cannot exclude 12/28/2017  . Snoring 09/27/2016  . Other chronic pain 09/27/2016  . CKD (chronic kidney disease), stage III 09/27/2016  . Failed back  syndrome, lumbar 09/27/2016  . Positive for microalbuminuria 09/27/2016  . Essential hypertension 09/27/2016  . S/P thyroidectomy 02/13/2016  . Vulvodynia 08/13/2014  . Vaginal atrophy 02/18/2014  . Endometriosis   . SINUSITIS- ACUTE-NOS 07/19/2008  . ELEVATED BLOOD PRESSURE WITHOUT DIAGNOSIS OF HYPERTENSION 07/05/2008  . DEPRESSION/ANXIETY 01/24/2008  . Obesity (BMI 30-39.9) 11/30/2007  . CONSTIPATION, CHRONIC 04/27/2007  . INSOMNIA, PERSISTENT 04/06/2007  . HYPOTHYROIDISM 02/07/2007  . OSTEOPENIA 02/07/2007  . HEADACHE 02/07/2007  . HEPATITIS C 03/18/2002    Past Surgical History:  Procedure Laterality Date  . Anterior cervical decompression and fusion with a reflex hyper plate  [id number]   [id number] and C5-C6 , Dr. Maia Plan  . BREAST EXCISIONAL BIOPSY Left 1982  . BREAST SURGERY  1983   left breast cyst removed  . cervical fusion w/OASIS system infuse  05/13/2006   C4-C5 and C5-C6 and C6-7,  Dr. Maia Plan  . CHOLECYSTECTOMY  ?2002  . CHOLECYSTECTOMY  2002  . COLONOSCOPY    . ESOPHAGEAL DILATION     Dr. Amedeo Plenty  . KNEE SURGERY  06/2017  . LAPAROSCOPIC ENDOMETRIOSIS FULGURATION  1980's   Dr. Warnell Forester  . LIVER BIOPSY  2002  . LOWER BACK SURGERY  OCT 2016  . LUMBAR DISC SURGERY  08/13/2003   Diskectomy/fusion L5-S1 rt., Dr. Maia Plan  . lumbar hemilaminectomy  01/1989   L5-S1 on right, Dr. Ardeen Jourdain  . NASAL  SINUS SURGERY  10/30/2007   Left upper Sinus lift,  Dr. Mingo Amber  . SHOULDER ARTHROSCOPY WITH SUBACROMIAL DECOMPRESSION Right 12/06/2014   Procedure: SHOULDER ARTHROSCOPY WITH SUBACROMIAL DECOMPRESSION; EXCISION BONE SPUR;  Surgeon: Roseanne Kaufman, MD;  Location: [facility];  Service: Orthopedics;  Laterality: Right;  . SHOULDER SURGERY Right 12/06/14   "cleaned up bone spurs"  . SHOULDER SURGERY Right FEB 2016  . SPINAL CORD STIMULATOR INSERTION  12/29/2004   leads placed along spine from T8 to T10, Dr. Maia Plan  . Spinal cord stimulator pulse generator  01/05/2005    placed in right upper buttocks, Dr. Maia Plan  . THYROID LOBECTOMY Right 02/13/2016  . THYROIDECTOMY Right 02/13/2016   Procedure: RIGHT THYROID LOBECTOMY WITH FROZEN SECTION ;  Surgeon: Izora Gala, MD;  Location: [facility];  Service: ENT;  Laterality: Right;  . TONSILLECTOMY       OB History    Gravida  1   Para  1   Term  0   Preterm  0   AB  0   Living  1     SAB  0   TAB  0   Ectopic  0   Multiple      Live Births              Family History  Problem Relation Age of Onset  . Hypertension Father   . Cancer Father        colon, liver  . Atrial fibrillation Father   . Hypertension Sister   . Hypertension Brother   . Cancer Brother        lung, adrenal glands and brain  . Hypertension Brother   . Aneurysm Mother        brain  . Stroke Mother 54    Social History   Tobacco Use  . Smoking status: Former Research scientist (life sciences)  . Smokeless tobacco: Never Used  . Tobacco comment: quit smoking in 1992  Substance Use Topics  . Alcohol use: Yes    Alcohol/week: 0.0 standard drinks    Comment: occ  . Drug use: No    Home Medications Prior to Admission medications   Medication Sig Start Date End Date Taking? Authorizing Provider  cyclobenzaprine (FLEXERIL) 10 MG tablet Take 10 mg by mouth every 8 (eight) hours as needed for muscle spasms.  11/16/19  Yes [provider]  DULoxetine (CYMBALTA) 60 MG capsule Take 60 mg by mouth daily. 12/28/19  Yes [provider]  fluticasone (FLONASE) 50 MCG/ACT nasal spray Place 2 sprays into the nose daily as needed for allergies.    Yes [provider]  HYDROmorphone (DILAUDID) 2 MG tablet Take 2 mg by mouth every 6 (six) hours as needed for moderate pain or severe pain.  12/31/19  Yes [provider]  levothyroxine (SYNTHROID, LEVOTHROID) 125 MCG tablet Take 1 tablet by mouth daily with breakfast. 12/27/17  Yes [provider]  lisinopril-hydrochlorothiazide (PRINZIDE,ZESTORETIC) 20-12.5 MG per  tablet Take 1 tablet by mouth every morning.    Yes [provider]  pantoprazole (PROTONIX) 40 MG tablet Take 40 mg by mouth daily. 11/19/19  Yes [provider]  pravastatin (PRAVACHOL) 20 MG tablet Take 20 mg by mouth daily.    Yes [provider]  rizatriptan (MAXALT) 10 MG tablet Take 10 mg by mouth as needed for migraine. May repeat in 2 hours if needed   Yes [provider]  tiZANidine (ZANAFLEX) 2 MG tablet Take 2  mg by mouth every 8 (eight) hours as needed for muscle spasms.  12/31/19  Yes [provider]  traMADol (ULTRAM) 50 MG tablet Take 1 tablet (50 mg total) by mouth every 6 (six) hours as needed. Patient taking differently: Take 50 mg by mouth every 6 (six) hours as needed.  02/13/16  Yes Izora Gala, MD  traZODone (DESYREL) 150 MG tablet Take 150 mg by mouth at bedtime. 12/28/19  Yes [provider]  Wheat Dextrin (BENEFIBER DRINK MIX PO) Take 10 mLs by mouth in the morning, at noon, and at bedtime.   Yes [provider]  Lidocaine 2 % GEL Apply 1 application topically as needed. 02/06/18   Huel Cote, NP  NONFORMULARY OR COMPOUNDED ITEM Estradiol vaginal cream 0.02% insert 1 gram  2-3 times weekly Patient not taking: Reported on 01/17/2020 12/21/19   Huel Cote, NP  polyethylene glycol (MIRALAX / Floria Raveling) packet Take 17 g by mouth daily as needed for mild constipation or moderate constipation. Patient not taking: Reported on 01/17/2020 12/24/15   Antonietta Breach, PA-C    Allergies    Duloxetine, Acetic acid-oxyquinoline, Buprenorphine hcl, Codeine, Imitrex [sumatriptan], Morphine, Morphine and related, Pentazocine, Pentazocine lactate, and Sumatriptan succinate  Review of Systems   Review of Systems  Constitutional: Negative for appetite change, chills and fever.  HENT: Negative for ear pain, rhinorrhea, sneezing and sore throat.   Eyes: Negative for photophobia and visual disturbance.  Respiratory: Negative for cough,  chest tightness, shortness of breath and wheezing.   Cardiovascular: Negative for chest pain and palpitations.  Gastrointestinal: Negative for abdominal pain, blood in stool, constipation, diarrhea, nausea and vomiting.  Genitourinary: Negative for dysuria, hematuria and urgency.  Musculoskeletal: Negative for myalgias.  Skin: Negative for rash.  Neurological: Negative for dizziness, weakness and light-headedness.       +confusion    Physical Exam Updated Vital Signs BP 134/87   Pulse 74   Temp 98 F (36.7 C) (Oral)   Resp 16   Ht 5\' 6"  (1.676 m)   Wt 83.5 kg   LMP 06/24/2012   SpO2 96%   BMI 29.70 kg/m   Physical Exam Vitals and nursing note reviewed.  Constitutional:      General: She is not in acute distress.    Appearance: She is well-developed.  HENT:     Head: Normocephalic and atraumatic.     Nose: Nose normal.  Eyes:     General: No scleral icterus.       Left eye: No discharge.     Conjunctiva/sclera: Conjunctivae normal.  Cardiovascular:     Rate and Rhythm: Normal rate and regular rhythm.     Heart sounds: Normal heart sounds. No murmur. No friction rub. No gallop.   Pulmonary:     Effort: Pulmonary effort is normal. No respiratory distress.     Breath sounds: Normal breath sounds.  Abdominal:     General: Bowel sounds are normal. There is no distension.     Palpations: Abdomen is soft.     Tenderness: There is no abdominal tenderness. There is no guarding.  Musculoskeletal:        General: Normal range of motion.     Cervical back: Normal range of motion and neck supple.  Skin:    General: Skin is warm and dry.     Findings: No rash.  Neurological:     General: No focal deficit present.     Mental Status: She is alert. She is  disoriented.     Cranial Nerves: No cranial nerve deficit.     Sensory: No sensory deficit.     Motor: No weakness or abnormal muscle tone.     Coordination: Coordination normal.     Comments: Alert, oriented to self, place,  year. Believes it is Friday in March. Able to name family members. Pupils reactive. No facial asymmetry noted. Cranial nerves appear grossly intact. Sensation intact to light touch on face, BUE and BLE. Strength 5/5 in BUE and BLE. Normal finger to nose coordination bilaterally.      ED Results / Procedures / Treatments   Labs (all labs ordered are listed, but only abnormal results are displayed) Labs Reviewed  URINALYSIS, ROUTINE W REFLEX MICROSCOPIC - Abnormal; Notable for the following components:      Result Value   Color, Urine STRAW (*)    Specific Gravity, Urine 1.003 (*)    All other components within normal limits  URINE CULTURE  COMPREHENSIVE METABOLIC PANEL  CBC WITH DIFFERENTIAL/PLATELET  CBG MONITORING, ED    EKG None  Radiology CT Head Wo Contrast  Result Date: 01/17/2020 CLINICAL DATA:  Confusion. Additional provided: Confusion, headache. EXAM: CT HEAD WITHOUT CONTRAST TECHNIQUE: Contiguous axial images were obtained from the base of the skull through the vertex without intravenous contrast. COMPARISON:  Head CT 03/31/2006 FINDINGS: Brain: There is no evidence of acute intracranial hemorrhage, intracranial mass, midline shift or extra-axial fluid collection.No demarcated cortical infarction. Stable, mild cerebellar atrophy Vascular: No hyperdense vessel. Skull: Normal. Negative for fracture or focal lesion. Sinuses/Orbits: Visualized orbits demonstrate no acute abnormality. Mild ethmoid sinus mucosal thickening. No significant mastoid effusion. IMPRESSION: No evidence of acute intracranial abnormality. Mild cerebellar atrophy, stable as compared to head CT 03/31/2006. Mild ethmoid sinus mucosal thickening. Electronically Signed   By: Kellie Simmering DO   On: 01/17/2020 13:59    Procedures Procedures (including critical care time)  Medications Ordered in ED Medications  metoCLOPramide (REGLAN) injection 10 mg (10 mg Intravenous Given 01/17/20 1502)  diphenhydrAMINE  (BENADRYL) injection 25 mg (25 mg Intravenous Given 01/17/20 1502)    ED Course  I have reviewed the triage vital signs and the nursing notes.  Pertinent labs & imaging results that were available during my care of the patient were reviewed by me and considered in my medical decision making (see chart for details).  Clinical Course as of Jan 16 1513  Thu Jan 17, 2020  1315 Spoke to Dr. Cheral Marker of neurology.  Recommends MRI and EEG.   [HK]    Clinical Course User Index [HK] Delia Heady, PA-C   MDM Rules/Calculators/A&P                      64 year old female with a past medical history of hypertension, hyperlipidemia, GERD presenting to the ED with a chief complaint of confusion.  Woke up at 830 this morning in usual state of health, after she went to the shower around 9 AM she began feeling confused.  She was asking repetitive questions about her recent shoulder surgery.  She states that her symptoms have gradually improved over the past 3 hours but did have a headache since being in the ED.  Denies vision changes, numbness in arms or legs, vomiting, chest pain.  On exam patient is alert and oriented with the exception of not knowing the day of the week or month. She has no neurological deficits.  No meningeal signs.  She is afebrile.  She remains  ambulatory.  CT of the head without any acute abnormalities.  Spoke to Dr. Cheral Marker of neurology who recommends MRI and EEG.  Unfortunately patient unable to obtain MRI due to her spinal cord stimulator.  Dr. Cheral Marker recommends only EEG if that is the case. She is pending lab work and EEG. If no concerning findings, will be discharged home with appropriate follow up. Consider complex migraine, I have placed an order for a migraine cocktail to see if this helps with her symptoms. She does state her symptoms are gradually resolving. Care handed off to oncoming provider pending remainder of workup and reassessment.  Final Clinical Impression(s) / ED  Diagnoses Final diagnoses:  Amnesia    Rx / DC Orders ED Discharge Orders    None     Portions of this note were generated with Dragon dictation software. Dictation errors may occur despite best attempts at proofreading.    Delia Heady, PA-C 01/17/20 1515    Maudie Flakes, MD 01/21/20 [phone]

## 2020-01-17 NOTE — Progress Notes (Signed)
EEG complete - results pending 

## 2020-01-17 NOTE — Procedures (Signed)
Patient Name: [name]  MRN: [id number]  Epilepsy Attending: Lora Havens  Referring Physician/Provider: Delia Heady, PA Date: 01/17/2020 Duration: 23.36 mins  Patient history: 64yo F who presented with transient alteration of awareness.   Level of alertness: awake, asleep  AEDs during EEG study: None  Technical aspects: This EEG study was done with scalp electrodes positioned according to the 10-20 International system of electrode placement. Electrical activity was acquired at a sampling rate of 500Hz  and reviewed with a high frequency filter of 70Hz  and a low frequency filter of 1Hz . EEG data were recorded continuously and digitally stored.   DESCRIPTION: The posterior dominant rhythm consists of 9 Hz activity of moderate voltage (25-35 uV) seen predominantly in posterior head regions, symmetric and reactive to eye opening and eye closing. Sleep was characterized by vertex waves, sleep spindles (12-14hz ), maximal frontocentral as well as K complexes. Hyperventilation and photic stimulation were not performed.  IMPRESSION: This study is within normal limits. No seizures or epileptiform discharges were seen throughout the recording.  [name]

## 2020-01-17 NOTE — ED Notes (Signed)
Patient transported to CT 

## 2020-01-18 LAB — URINE CULTURE: Culture: NO GROWTH

## 2020-01-21 ENCOUNTER — Other Ambulatory Visit: Payer: Self-pay

## 2020-01-21 DIAGNOSIS — M7502 Adhesive capsulitis of left shoulder: Secondary | ICD-10-CM | POA: Diagnosis not present

## 2020-01-21 DIAGNOSIS — M25312 Other instability, left shoulder: Secondary | ICD-10-CM | POA: Diagnosis not present

## 2020-01-21 DIAGNOSIS — M25612 Stiffness of left shoulder, not elsewhere classified: Secondary | ICD-10-CM | POA: Diagnosis not present

## 2020-01-21 DIAGNOSIS — M6281 Muscle weakness (generalized): Secondary | ICD-10-CM | POA: Diagnosis not present

## 2020-01-22 ENCOUNTER — Encounter: Payer: Self-pay | Admitting: Women's Health

## 2020-01-22 ENCOUNTER — Other Ambulatory Visit: Payer: Self-pay

## 2020-01-22 ENCOUNTER — Ambulatory Visit: Payer: Medicare HMO | Admitting: Women's Health

## 2020-01-22 VITALS — BP 122/80

## 2020-01-22 DIAGNOSIS — I120 Hypertensive chronic kidney disease with stage 5 chronic kidney disease or end stage renal disease: Secondary | ICD-10-CM | POA: Diagnosis not present

## 2020-01-22 DIAGNOSIS — Z01419 Encounter for gynecological examination (general) (routine) without abnormal findings: Secondary | ICD-10-CM | POA: Diagnosis not present

## 2020-01-22 DIAGNOSIS — G454 Transient global amnesia: Secondary | ICD-10-CM | POA: Diagnosis not present

## 2020-01-22 DIAGNOSIS — N1831 Chronic kidney disease, stage 3a: Secondary | ICD-10-CM | POA: Diagnosis not present

## 2020-01-22 DIAGNOSIS — H6121 Impacted cerumen, right ear: Secondary | ICD-10-CM | POA: Diagnosis not present

## 2020-01-22 DIAGNOSIS — H9191 Unspecified hearing loss, right ear: Secondary | ICD-10-CM | POA: Diagnosis not present

## 2020-01-22 NOTE — Progress Notes (Signed)
[name] [birth year] [id number]    History:    Presents for breast and pelvic exam.  Postmenopausal on no HRT with no bleeding.  Normal Pap and mammogram history.  2015 - colonoscopy 5-year follow-up, 2020 11 polyps, 3 yr F/U  Father deceased from colon cancer.  Benign thyroid biopsy 2017 on Synthroid.  Primary care manages hypertension.  2015 hepatitis C.  On disability due to chronic back pain, has had numerous back surgeries and neck surgery.  2018 T score -1.1 FRAX 5.3% / 0.3%.  Atypical migraine last week, neurology F/U scheduled. 12/2019 shoulder surgery, still in therapy.  Has had Covid vaccine, Shingrix and gets an annual flu vaccine.  Past medical history, past surgical history, family history and social history were all reviewed and documented in the EPIC chart.  1 daughter finishing up nursing school.    ROS:  A ROS was performed and pertinent positives and negatives are included.  Exam:  Vitals:   01/22/20 1221  BP: 122/80   There is no height or weight on file to calculate BMI.   General appearance:  Normal Thyroid:  Symmetrical, normal in size, without palpable masses or nodularity. Respiratory  Auscultation:  Clear without wheezing or rhonchi Cardiovascular  Auscultation:  Regular rate, without rubs, murmurs or gallops  Edema/varicosities:  Not grossly evident Abdominal  Soft,nontender, without masses, guarding or rebound.  Liver/spleen:  No organomegaly noted  Hernia:  None appreciated  Skin  Inspection:  Grossly normal   Breasts: Examined lying and sitting.     Right: Without masses, retractions, discharge or axillary adenopathy.     Left: Without masses, retractions, discharge or axillary adenopathy. Gentitourinary   Inguinal/mons:  Normal without inguinal adenopathy  External genitalia:  Normal  BUS/Urethra/Skene's glands:  Normal  Vagina:  Normal  Cervix:  Normal  Uterus: normal in size, shape and contour.  Midline and mobile  Adnexa/parametria:      Rt: Without masses or tenderness.   Lt: Without masses or tenderness.  Anus and perineum: Normal  Digital rectal exam: Normal sphincter tone without palpated masses or tenderness  Assessment/Plan:  64 y.o. M WF G1 P1 for breast and pelvic ex [place] am.   Postmenopausal on no HRT with no bleeding Chronic back and neck pain on disability Atypical migraine with global amnesia  last week has follow-up with neurologist 12/2019 rotator cuff surgery still in physical therapy  2020 11 benign colon polyps 3-year follow-up [facility] (father history of colon cancer) Osteopenia without elevated FRAX primary care manages Hypertension, hypothyroidism-primary care manages labs and meds  Plan: SBEs, continue annual 3D screening mammogram, calcium rich foods, vitamin D 2000 IUs daily.  Home safety, fall prevention and importance of weightbearing and balance type exercise as able.  03/2017 normal Pap Pap screening guidelines reviewed.    [place], 1:08 PM 01/22/2020

## 2020-01-22 NOTE — Patient Instructions (Signed)
It has been a pleasure knowing you Vitamin D 2000 IUs daily Health Maintenance for Postmenopausal Women Menopause is a normal process in which your ability to get pregnant comes to an end. This process happens slowly over many months or years, usually between the ages of 65 and 36. Menopause is complete when you have missed your menstrual periods for 12 months. It is important to talk with your health care provider about some of the most common conditions that affect women after menopause (postmenopausal women). These include heart disease, cancer, and bone loss (osteoporosis). Adopting a healthy lifestyle and getting preventive care can help to promote your health and wellness. The actions you take can also lower your chances of developing some of these common conditions. What should I know about menopause? During menopause, you may get a number of symptoms, such as:  Hot flashes. These can be moderate or severe.  Night sweats.  Decrease in sex drive.  Mood swings.  Headaches.  Tiredness.  Irritability.  Memory problems.  Insomnia. Choosing to treat or not to treat these symptoms is a decision that you make with your health care provider. Do I need hormone replacement therapy?  Hormone replacement therapy is effective in treating symptoms that are caused by menopause, such as hot flashes and night sweats.  Hormone replacement carries certain risks, especially as you become older. If you are thinking about using estrogen or estrogen with progestin, discuss the benefits and risks with your health care provider. What is my risk for heart disease and stroke? The risk of heart disease, heart attack, and stroke increases as you age. One of the causes may be a change in the body's hormones during menopause. This can affect how your body uses dietary fats, triglycerides, and cholesterol. Heart attack and stroke are medical emergencies. There are many things that you can do to help prevent  heart disease and stroke. Watch your blood pressure  High blood pressure causes heart disease and increases the risk of stroke. This is more likely to develop in people who have high blood pressure readings, are of African descent, or are overweight.  Have your blood pressure checked: ? Every 3-5 years if you are 77-43 years of age. ? Every year if you are 4 years old or older. Eat a healthy diet   Eat a diet that includes plenty of vegetables, fruits, low-fat dairy products, and lean protein.  Do not eat a lot of foods that are high in solid fats, added sugars, or sodium. Get regular exercise Get regular exercise. This is one of the most important things you can do for your health. Most adults should:  Try to exercise for at least 150 minutes each week. The exercise should increase your heart rate and make you sweat (moderate-intensity exercise).  Try to do strengthening exercises at least twice each week. Do these in addition to the moderate-intensity exercise.  Spend less time sitting. Even light physical activity can be beneficial. Other tips  Work with your health care provider to achieve or maintain a healthy weight.  Do not use any products that contain nicotine or tobacco, such as cigarettes, e-cigarettes, and chewing tobacco. If you need help quitting, ask your health care provider.  Know your numbers. Ask your health care provider to check your cholesterol and your blood sugar (glucose). Continue to have your blood tested as directed by your health care provider. Do I need screening for cancer? Depending on your health history and family history, you may  need to have cancer screening at different stages of your life. This may include screening for:  Breast cancer.  Cervical cancer.  Lung cancer.  Colorectal cancer. What is my risk for osteoporosis? After menopause, you may be at increased risk for osteoporosis. Osteoporosis is a condition in which bone destruction  happens more quickly than new bone creation. To help prevent osteoporosis or the bone fractures that can happen because of osteoporosis, you may take the following actions:  If you are 24-51 years old, get at least 1,000 mg of calcium and at least 600 mg of vitamin D per day.  If you are older than age 3 but younger than age 90, get at least 1,200 mg of calcium and at least 600 mg of vitamin D per day.  If you are older than age 59, get at least 1,200 mg of calcium and at least 800 mg of vitamin D per day. Smoking and drinking excessive alcohol increase the risk of osteoporosis. Eat foods that are rich in calcium and vitamin D, and do weight-bearing exercises several times each week as directed by your health care provider. How does menopause affect my mental health? Depression may occur at any age, but it is more common as you become older. Common symptoms of depression include:  Low or sad mood.  Changes in sleep patterns.  Changes in appetite or eating patterns.  Feeling an overall lack of motivation or enjoyment of activities that you previously enjoyed.  Frequent crying spells. Talk with your health care provider if you think that you are experiencing depression. General instructions See your health care provider for regular wellness exams and vaccines. This may include:  Scheduling regular health, dental, and eye exams.  Getting and maintaining your vaccines. These include: ? Influenza vaccine. Get this vaccine each year before the flu season begins. ? Pneumonia vaccine. ? Shingles vaccine. ? Tetanus, diphtheria, and pertussis (Tdap) booster vaccine. Your health care provider may also recommend other immunizations. Tell your health care provider if you have ever been abused or do not feel safe at home. Summary  Menopause is a normal process in which your ability to get pregnant comes to an end.  This condition causes hot flashes, night sweats, decreased interest in sex,  mood swings, headaches, or lack of sleep.  Treatment for this condition may include hormone replacement therapy.  Take actions to keep yourself healthy, including exercising regularly, eating a healthy diet, watching your weight, and checking your blood pressure and blood sugar levels.  Get screened for cancer and depression. Make sure that you are up to date with all your vaccines. This information is not intended to replace advice given to you by your health care provider. Make sure you discuss any questions you have with your health care provider. Document Revised: 09/27/2018 Document Reviewed: 09/27/2018 Elsevier Patient Education  2020 [employer].

## 2020-01-25 DIAGNOSIS — M25312 Other instability, left shoulder: Secondary | ICD-10-CM | POA: Diagnosis not present

## 2020-01-25 DIAGNOSIS — M7502 Adhesive capsulitis of left shoulder: Secondary | ICD-10-CM | POA: Diagnosis not present

## 2020-01-25 DIAGNOSIS — M25612 Stiffness of left shoulder, not elsewhere classified: Secondary | ICD-10-CM | POA: Diagnosis not present

## 2020-01-25 DIAGNOSIS — M6281 Muscle weakness (generalized): Secondary | ICD-10-CM | POA: Diagnosis not present

## 2020-01-29 DIAGNOSIS — M25312 Other instability, left shoulder: Secondary | ICD-10-CM | POA: Diagnosis not present

## 2020-01-29 DIAGNOSIS — M6281 Muscle weakness (generalized): Secondary | ICD-10-CM | POA: Diagnosis not present

## 2020-01-29 DIAGNOSIS — M7502 Adhesive capsulitis of left shoulder: Secondary | ICD-10-CM | POA: Diagnosis not present

## 2020-01-29 DIAGNOSIS — M25612 Stiffness of left shoulder, not elsewhere classified: Secondary | ICD-10-CM | POA: Diagnosis not present

## 2020-01-31 DIAGNOSIS — M25312 Other instability, left shoulder: Secondary | ICD-10-CM | POA: Diagnosis not present

## 2020-01-31 DIAGNOSIS — M25612 Stiffness of left shoulder, not elsewhere classified: Secondary | ICD-10-CM | POA: Diagnosis not present

## 2020-01-31 DIAGNOSIS — M6281 Muscle weakness (generalized): Secondary | ICD-10-CM | POA: Diagnosis not present

## 2020-01-31 DIAGNOSIS — M7502 Adhesive capsulitis of left shoulder: Secondary | ICD-10-CM | POA: Diagnosis not present

## 2020-02-05 ENCOUNTER — Ambulatory Visit: Payer: Medicare HMO | Attending: Internal Medicine

## 2020-02-05 DIAGNOSIS — Z23 Encounter for immunization: Secondary | ICD-10-CM

## 2020-02-05 NOTE — Progress Notes (Signed)
   Covid-19 Vaccination Clinic  Name:  [name]    MRN: [id number] DOB: 1955-12-06  02/05/2020  [name] was observed post Covid-19 immunization for 15 minutes without incident. She was provided with Vaccine Information Sheet and instruction to access the V-Safe system.   [name] was instructed to call 911 with any severe reactions post vaccine: Marland Kitchen Difficulty breathing  . Swelling of face and throat  . A fast heartbeat  . A bad rash all over body  . Dizziness and weakness   Immunizations Administered    Name Date Dose VIS Date Route   Pfizer COVID-19 Vaccine 02/05/2020  9:17 AM 0.3 mL 12/12/2018 Intramuscular   Manufacturer: [place]   Lot: [id number]   [place]: [id number]

## 2020-02-06 ENCOUNTER — Encounter: Payer: Self-pay | Admitting: Neurology

## 2020-02-06 ENCOUNTER — Ambulatory Visit: Payer: Medicare HMO | Admitting: Neurology

## 2020-02-06 ENCOUNTER — Other Ambulatory Visit: Payer: Self-pay

## 2020-02-06 VITALS — BP 135/84 | HR 89 | Temp 97.0°F | Ht 66.0 in | Wt 187.0 lb

## 2020-02-06 DIAGNOSIS — G454 Transient global amnesia: Secondary | ICD-10-CM | POA: Diagnosis not present

## 2020-02-06 DIAGNOSIS — R030 Elevated blood-pressure reading, without diagnosis of hypertension: Secondary | ICD-10-CM

## 2020-02-06 DIAGNOSIS — F341 Dysthymic disorder: Secondary | ICD-10-CM

## 2020-02-06 NOTE — Patient Instructions (Signed)
Transient Global Amnesia Transient global amnesia causes a sudden and temporary (transient) loss of memory (amnesia). You may recall memories from your distant past and people you know well. However, you may not recall things that happened more recently in the past days, months, or even year. A transient global amnesia episode does not last longer than 24 hours. Transient global amnesia does not affect your other brain functions. Your memory usually returns to normal after an episode is over. One episode of transient global amnesia does not make you more likely to have a stroke, a relapse, or other complications. What are the causes? The cause of this condition is not known. Certain activities have been reported to trigger transient global amnesia. These activities include:  Swimming in very cold or hot water.  Sexual intercourse.  Emotional distress, such as receiving bad news or having a lot of stress at once.  Strenuous exercise or activity. What increases the risk? You are more likely to develop this condition if:  You are 50-70 years old.  You have a history of migraine headaches. What are the signs or symptoms? The main symptoms of this condition include:  Being unable to remember recent events.  Asking repetitive questions about a situation and surroundings and not recalling the answers to these questions. Other symptoms include:  Restlessness and nervousness.  Confusion.  Headaches.  Dizziness.  Nausea. How is this diagnosed? This condition may be diagnosed based on:  Your symptoms.  A physical exam.  A test to check your mental abilities (cognitive evaluation).  Imaging studies to check brain function. These may include: ? Electroencephalogram (EEG). This test checks the brain's electrical activity. ? CT scan. ? MRI. How is this treated? There is no treatment for this condition. An episode typically goes away on its own after a few hours. You may receive  medicines to treat other conditions, such as a migraine. Follow these instructions at home:  Take over-the-counter and prescription medicines only as told by your health care provider.  Avoid taking medicines that can affect thinking, such as pain or sleeping medicines.  Learn what activities may trigger an episode. Avoid these activities as told by your health care provider.  Find ways to manage stress, such as meditation or yoga.  Keep all follow-up visits as told by your health care provider. This is important. Contact a health care provider if you:  Have a migraine that does not go away.  Experience transient global amnesia repeatedly. Get help right away if you:  Have a seizure. Summary  Transient global amnesia causes a sudden and temporary (transient) loss of memory (amnesia).  There is no treatment for this condition. An episode typically goes away on its own after a few hours.  You may receive medicines to treat other conditions, such as a migraine.  Transient global amnesia does not affect your other brain functions. Your memory usually returns to normal after an episode is over. This information is not intended to replace advice given to you by your health care provider. Make sure you discuss any questions you have with your health care provider. Document Revised: 10/19/2017 Document Reviewed: 10/19/2017 Elsevier Patient Education  2020 Elsevier Inc.  

## 2020-02-06 NOTE — Progress Notes (Signed)
SLEEP MEDICINE CLINIC    Provider:  Larey Seat, MD  Primary Care Physician:  Haywood Pao, [place] [address]     Referring Provider: Haywood Pao, [address] [place],  [place] [address]          Chief Complaint according to patient   Patient presents with:    . New Patient (Initial Visit)           HISTORY OF PRESENT ILLNESS:  [name] is a 64 year old Caucasian female patient seen here as a referral on 02/06/2020 from ED and PCP  for a TGA Episode .  Chief concern according to patient : " pt presents today for having amnestic symptoms on4/1. she remembers waking up to get her coffee and then doesnt remember taking a shower, putting makeup on, having recent shoulder surgery. -she just lost memory of doing- 4/1 went to ER by EMS . EEG and CT there negative. Can't have MRI- ER dx with transcient global amnesia. she f/u with her PCP and the NP/PA thought this may reflect potentially migraines. Headaches came on after the ED visit- treatment with Reglan and benadryl , but patient did not report nausea, no visual auras "   I have the pleasure of seeing [name] today, a right -handed White or Caucasian female who  has a past medical history of Arthritis, DDD (degenerative disc disease), lumbar, Diabetes mellitus (1-13), GERD (gastroesophageal reflux disease), Hepatitis, History of bronchitis, Hyperlipidemia, Hypertension, Spinal cord stimulator status, and Thyroid disease. now suspected to have had a TGA.  History of migraines since 1980.   ED : [name] is a 64 y.o. female with a past medical history of hypertension, hyperlipidemia, GERD, diabetes, hypothyroidism presenting to the ED with a chief complaint of confusion. Patient woke up around 8:30 AM this morning in her usual state of health. Believes shedrank coffee and then went to take a shower around 9 AM. When she got out of the shower, states that she was confused,  unable to remember anything about her recent left shoulder surgery or anything that she did yesterday. She called her daughter and daughter states that she was asking her "the same three questions about her surgery."States that her symptoms have gradually resolved over the past 3 hours. She began having a headache since arrival to the ED. Deniesvision changes, vomiting, injuries or falls, numbness in arms or legs, chest pain, shortness of breath, neck pain or stiffness, fever, history of similar symptoms in the past.     The patient had the first sleep study in the year 2018 with a result of an AHI ( Apnea Hypopnea index) of less than 5.  Family medical /sleep history: mother had brain aneurysm . Brother with migraines, daughter with migraines, granddaughter with migraines- menstrual.   Social history: Patient is disabled from back condition - 8 surgeies and spinal cord stimulator. and lives in a household with spouse .Tobacco use; none , quit in 1992.  ETOH use ; rare , Caffeine intake in form of Coffee( decaffeinated ) Soda( decaff) Tea ( decaff) -no  energy drinks.   Review of Systems: Out of a complete 14 system review, the patient complains of only the following symptoms, and all other reviewed systems are negative.:      Social History   Socioeconomic History  . Marital status: Married    Spouse name: Not on file  . Number of children:  Not on file  . Years of education: Not on file  . Highest education level: Not on file  Occupational History  . Not on file  Tobacco Use  . Smoking status: Former Smoker    Quit date: 1992    Years since quitting: 29.3  . Smokeless tobacco: Never Used  . Tobacco comment: quit smoking in 1992  Substance and Sexual Activity  . Alcohol use: Yes    Alcohol/week: 0.0 standard drinks    Comment: occ  . Drug use: No  . Sexual activity: Yes    Birth control/protection: None  Other Topics Concern  . Not on file  Social History Narrative   Walks  the dog exercise.  Has to stop breath while walking the dog.    Drinks about 3 caffeine drinks a week    Currently disabled after 8 back surgeries with spinal cord/injury   Social Determinants of Health   Financial Resource Strain:   . Difficulty of Paying Living Expenses:   Food Insecurity:   . Worried About Charity fundraiser in the Last Year:   . Arboriculturist in the Last Year:   Transportation Needs:   . Film/video editor (Medical):   Marland Kitchen Lack of Transportation (Non-Medical):   Physical Activity:   . Days of Exercise per Week:   . Minutes of Exercise per Session:   Stress:   . Feeling of Stress :   Social Connections:   . Frequency of Communication with Friends and Family:   . Frequency of Social Gatherings with Friends and Family:   . Attends Religious Services:   . Active Member of Clubs or Organizations:   . Attends Archivist Meetings:   Marland Kitchen Marital Status:     Family History  Problem Relation Age of Onset  . Hypertension Father   . Cancer Father        colon, liver  . Atrial fibrillation Father   . Hypertension Sister   . Hypertension Brother   . Cancer Brother        lung, adrenal glands and brain  . Hypertension Brother   . Aneurysm Mother        brain  . Stroke Mother 18    Past Medical History:  Diagnosis Date  . Arthritis   . DDD (degenerative disc disease), lumbar   . Diabetes mellitus 1-13   patient denies and says never made aware;  BORDERLINE DR. BALAN-diet controlled;   Marland Kitchen GERD (gastroesophageal reflux disease)   . Hepatitis    genotype 1b hepatitis C virus, monitored by [facility] infectious disease group  . History of bronchitis   . Hyperlipidemia   . Hypertension   . Spinal cord stimulator status    placed 2006  . Thyroid disease    takes synthroid    Past Surgical History:  Procedure Laterality Date  . Anterior cervical decompression and fusion with a reflex hyper plate  [id number]   [id number] and C5-C6 , Dr. Maia Plan  . BREAST  EXCISIONAL BIOPSY Left 1982  . BREAST SURGERY  1983   left breast cyst removed  . cervical fusion w/OASIS system infuse  05/13/2006   C4-C5 and C5-C6 and C6-7,  Dr. Maia Plan  . CHOLECYSTECTOMY  ?2002  . CHOLECYSTECTOMY  2002  . COLONOSCOPY    . ESOPHAGEAL DILATION     Dr. Amedeo Plenty  . KNEE SURGERY  06/2017  . LAPAROSCOPIC ENDOMETRIOSIS FULGURATION  1980's   Dr. Warnell Forester  .  LIVER BIOPSY  2002  . LOWER BACK SURGERY  OCT 2016  . LUMBAR DISC SURGERY  08/13/2003   Diskectomy/fusion L5-S1 rt., Dr. Maia Plan  . lumbar hemilaminectomy  01/1989   L5-S1 on right, Dr. Ardeen Jourdain  . NASAL SINUS SURGERY  10/30/2007   Left upper Sinus lift,  Dr. Mingo Amber  . SHOULDER ARTHROSCOPY WITH SUBACROMIAL DECOMPRESSION Right 12/06/2014   Procedure: SHOULDER ARTHROSCOPY WITH SUBACROMIAL DECOMPRESSION; EXCISION BONE SPUR;  Surgeon: Roseanne Kaufman, MD;  Location: [facility];  Service: Orthopedics;  Laterality: Right;  . SHOULDER SURGERY Right 12/06/14   "cleaned up bone spurs"  . SHOULDER SURGERY Right FEB 2016  . SPINAL CORD STIMULATOR INSERTION  12/29/2004   leads placed along spine from T8 to T10, Dr. Maia Plan  . Spinal cord stimulator pulse generator  01/05/2005   placed in right upper buttocks, Dr. Maia Plan  . THYROID LOBECTOMY Right 02/13/2016  . THYROIDECTOMY Right 02/13/2016   Procedure: RIGHT THYROID LOBECTOMY WITH FROZEN SECTION ;  Surgeon: Izora Gala, MD;  Location: [facility];  Service: ENT;  Laterality: Right;  . TONSILLECTOMY       Current Outpatient Medications on File Prior to Visit  Medication Sig Dispense Refill  . cyclobenzaprine (FLEXERIL) 10 MG tablet Take 10 mg by mouth every 8 (eight) hours as needed for muscle spasms.     . DULoxetine (CYMBALTA) 60 MG capsule Take 60 mg by mouth daily.    . fluticasone (FLONASE) 50 MCG/ACT nasal spray Place 2 sprays into the nose daily as needed for allergies.     Marland Kitchen levothyroxine (SYNTHROID, LEVOTHROID) 125 MCG tablet Take 1 tablet by mouth daily with  breakfast.    . lisinopril-hydrochlorothiazide (PRINZIDE,ZESTORETIC) 20-12.5 MG per tablet Take 0.5 tablets by mouth every morning.     . pantoprazole (PROTONIX) 40 MG tablet Take 40 mg by mouth daily.    . pravastatin (PRAVACHOL) 20 MG tablet Take 20 mg by mouth daily.     . rizatriptan (MAXALT) 10 MG tablet Take 10 mg by mouth as needed for migraine. May repeat in 2 hours if needed    . traMADol (ULTRAM) 50 MG tablet Take 1 tablet (50 mg total) by mouth every 6 (six) hours as needed. (Patient taking differently: Take 50 mg by mouth every 6 (six) hours as needed. ) 15 tablet 0  . traZODone (DESYREL) 150 MG tablet Take 150 mg by mouth at bedtime.    . Wheat Dextrin (BENEFIBER DRINK MIX PO) Take 10 mLs by mouth in the morning, at noon, and at bedtime.     No current facility-administered medications on file prior to visit.    Allergies  Allergen Reactions  . Acetic Acid-Oxyquinoline Other (See Comments)    FEM PH; caused (vaginal) bleeding  . Buprenorphine Hcl Itching, Nausea Only and Rash  . Codeine Nausea Only  . Imitrex [Sumatriptan] Hives    "get hot and hyperventilate"  . Morphine Rash  . Morphine And Related Itching, Nausea Only and Other (See Comments)    Pt states actually causes her pain in stomach  . Pentazocine Other (See Comments)    Caused vaginal bleeding; was given to patient by OB-GYN for pain secondary to GYN issue  . Pentazocine Lactate Other (See Comments)    Caused vaginal bleeding; was given to patient by OB-GYN for pain secondary to GYN issue  . Sumatriptan Succinate Hives    Hyperventilation "get hot and hyperventilate"    Physical exam:  Today's Vitals  02/06/20 0809  BP: 135/84  Pulse: 89  Temp: (!) 97 F (36.1 C)  Weight: 187 lb (84.8 kg)  Height: 5\' 6"  (1.676 m)   Body mass index is 30.18 kg/m.   Wt Readings from Last 3 Encounters:  02/06/20 187 lb (84.8 kg)  01/17/20 184 lb (83.5 kg)  03/07/18 186 lb (84.4 kg)     Ht Readings from Last 3  Encounters:  02/06/20 5\' 6"  (1.676 m)  01/17/20 5\' 6"  (1.676 m)  03/07/18 5\' 6"  (1.676 m)      General: The patient is awake, alert and appears not in acute distress. The patient is well groomed. Head: Normocephalic, atraumatic. Neck is supple.  Cardiovascular:  Regular rate and cardiac rhythm by pulse,  without distended neck veins. Respiratory: Lungs are clear to auscultation.  Skin:  Without evidence of ankle edema, or rash. Trunk: The patient's posture is erect.   Neurologic exam : The patient is awake and alert, oriented to place and time.   Memory subjective described as intact.  Attention span & concentration ability appears normal.  Speech is fluent,  without  dysarthria, dysphonia or aphasia.  Mood and affect are appropriate.   Cranial nerves: no loss of smell or taste reported  Pupils are equal and briskly reactive to light. Funduscopic exam deferred. Extraocular movements in vertical and horizontal planes were intact and without nystagmus. No Diplopia. Visual fields by finger perimetry are intact. Hearing was intact to soft voice and finger rubbing.   Facial sensation intact to fine touch. Facial motor strength is symmetric and tongue and uvula move midline.  Neck ROM : rotation, tilt and flexion extension were normal for age and shoulder shrug was symmetrical. Shoulder shrug is ROM limited- plantar fasciitis.    Motor exam:  Symmetric bulk, tone and ROM.   Normal tone without cog wheeling, symmetric grip strength . Sensory:  Fine touch, pinprick and vibration were tested  and  normal.  Proprioception tested in the upper extremities was normal. Coordination: Rapid alternating movements in the fingers/hands were of normal speed.  The Finger-to-nose maneuver was intact without evidence of ataxia, dysmetria or tremor.  Gait and station: Patient could rise unassisted from a seated position, walked without assistive device.  Stance is of normal width/ base and the patient  turned with 3 steps.  Toe and heel walk were deferred.  Deep tendon reflexes: in the  upper and lower extremities are symmetric and intact.  Babinski response was deferred.        After spending a total time of  35 minutes face to face and additional time for physical and neurologic examination, review of laboratory studies,  personal review of imaging studies, reports and results of other testing and review of referral information / records as far as provided in visit, I have established the following assessments:  1) This was TGA - normal EEG reviewed, normal CT reviewed images with patient.  2) The TGA took place in the setting of high BP, no external injuries, no intoxication, no trauma.  3) fully oriented now.    My Plan is to proceed with:  1) no follow up- there is no need to repeat images or EEG.     I would like to thank Tisovec, Fransico Him, MD and Haywood Pao, Md [address] [place],  [place] [address] for allowing me to meet with and to take care of this pleasant patient.   In short, [name] is presenting with TGA.  Electronically signed by: Larey Seat, MD 02/06/2020 8:49 AM  [facility] and [facility] Board certified by The [employer] of Sleep Medicine and Diplomate of the [employer] of Sleep Medicine. Board certified In Neurology through the [employer], Fellow of the [employer] of Neurology. Medical Director of [facility].

## 2020-02-14 DIAGNOSIS — K006 Disturbances in tooth eruption: Secondary | ICD-10-CM | POA: Diagnosis not present

## 2020-02-15 DIAGNOSIS — K5904 Chronic idiopathic constipation: Secondary | ICD-10-CM | POA: Diagnosis not present

## 2020-02-18 DIAGNOSIS — Z79899 Other long term (current) drug therapy: Secondary | ICD-10-CM | POA: Diagnosis not present

## 2020-02-18 DIAGNOSIS — F1120 Opioid dependence, uncomplicated: Secondary | ICD-10-CM | POA: Diagnosis not present

## 2020-02-18 DIAGNOSIS — M21621 Bunionette of right foot: Secondary | ICD-10-CM | POA: Diagnosis not present

## 2020-02-18 DIAGNOSIS — M5020 Other cervical disc displacement, unspecified cervical region: Secondary | ICD-10-CM | POA: Diagnosis not present

## 2020-02-18 DIAGNOSIS — G5761 Lesion of plantar nerve, right lower limb: Secondary | ICD-10-CM | POA: Diagnosis not present

## 2020-02-18 DIAGNOSIS — M21962 Unspecified acquired deformity of left lower leg: Secondary | ICD-10-CM | POA: Diagnosis not present

## 2020-02-18 DIAGNOSIS — M21622 Bunionette of left foot: Secondary | ICD-10-CM | POA: Diagnosis not present

## 2020-02-18 DIAGNOSIS — M5126 Other intervertebral disc displacement, lumbar region: Secondary | ICD-10-CM | POA: Diagnosis not present

## 2020-02-18 DIAGNOSIS — Q6672 Congenital pes cavus, left foot: Secondary | ICD-10-CM | POA: Diagnosis not present

## 2020-02-18 DIAGNOSIS — M21961 Unspecified acquired deformity of right lower leg: Secondary | ICD-10-CM | POA: Diagnosis not present

## 2020-02-26 DIAGNOSIS — H524 Presbyopia: Secondary | ICD-10-CM | POA: Diagnosis not present

## 2020-02-26 DIAGNOSIS — I10 Essential (primary) hypertension: Secondary | ICD-10-CM | POA: Diagnosis not present

## 2020-03-04 DIAGNOSIS — M792 Neuralgia and neuritis, unspecified: Secondary | ICD-10-CM | POA: Diagnosis not present

## 2020-03-04 DIAGNOSIS — M21961 Unspecified acquired deformity of right lower leg: Secondary | ICD-10-CM | POA: Diagnosis not present

## 2020-03-04 DIAGNOSIS — G5761 Lesion of plantar nerve, right lower limb: Secondary | ICD-10-CM | POA: Diagnosis not present

## 2020-03-04 DIAGNOSIS — M21962 Unspecified acquired deformity of left lower leg: Secondary | ICD-10-CM | POA: Diagnosis not present

## 2020-03-04 DIAGNOSIS — M7751 Other enthesopathy of right foot: Secondary | ICD-10-CM | POA: Diagnosis not present

## 2020-03-07 DIAGNOSIS — I120 Hypertensive chronic kidney disease with stage 5 chronic kidney disease or end stage renal disease: Secondary | ICD-10-CM | POA: Diagnosis not present

## 2020-03-07 DIAGNOSIS — E039 Hypothyroidism, unspecified: Secondary | ICD-10-CM | POA: Diagnosis not present

## 2020-03-07 DIAGNOSIS — H8110 Benign paroxysmal vertigo, unspecified ear: Secondary | ICD-10-CM | POA: Diagnosis not present

## 2020-03-07 DIAGNOSIS — N1831 Chronic kidney disease, stage 3a: Secondary | ICD-10-CM | POA: Diagnosis not present

## 2020-03-20 ENCOUNTER — Telehealth: Payer: Self-pay | Admitting: Neurology

## 2020-03-20 DIAGNOSIS — W1830XA Fall on same level, unspecified, initial encounter: Secondary | ICD-10-CM | POA: Diagnosis not present

## 2020-03-20 DIAGNOSIS — R7989 Other specified abnormal findings of blood chemistry: Secondary | ICD-10-CM | POA: Diagnosis not present

## 2020-03-20 DIAGNOSIS — G454 Transient global amnesia: Secondary | ICD-10-CM | POA: Diagnosis not present

## 2020-03-20 DIAGNOSIS — H8110 Benign paroxysmal vertigo, unspecified ear: Secondary | ICD-10-CM | POA: Diagnosis not present

## 2020-03-20 NOTE — Telephone Encounter (Signed)
Called patient who stated she's had dizziness "for a while". She saw PCP 1 1/2 weeks ago and was diagnosed with vertigo. She's been doing "pillow exercise" as PCP advised. She stated it was worse this morning. She didn't hit her head this morning but has abrasions on her left arm. I advised her that Dr Brett Fairy is now out of office for remainder of today. I advised she call PCP to be seen, evaluated. She Patient verbalized understanding, appreciation.

## 2020-03-20 NOTE — Telephone Encounter (Signed)
Pt called stating that she has been having dizziness and this morning she fell coming out of the bathroom due to the dizziness. Pt asked if she can be called back to be advised on what she can do to take care of this dizziness. Please advise.

## 2020-03-28 DIAGNOSIS — M21962 Unspecified acquired deformity of left lower leg: Secondary | ICD-10-CM | POA: Diagnosis not present

## 2020-03-28 DIAGNOSIS — M792 Neuralgia and neuritis, unspecified: Secondary | ICD-10-CM | POA: Diagnosis not present

## 2020-03-28 DIAGNOSIS — I120 Hypertensive chronic kidney disease with stage 5 chronic kidney disease or end stage renal disease: Secondary | ICD-10-CM | POA: Diagnosis not present

## 2020-03-28 DIAGNOSIS — M21961 Unspecified acquired deformity of right lower leg: Secondary | ICD-10-CM | POA: Diagnosis not present

## 2020-03-28 DIAGNOSIS — Q6672 Congenital pes cavus, left foot: Secondary | ICD-10-CM | POA: Diagnosis not present

## 2020-03-28 DIAGNOSIS — M859 Disorder of bone density and structure, unspecified: Secondary | ICD-10-CM | POA: Diagnosis not present

## 2020-03-28 DIAGNOSIS — Z0000 Encounter for general adult medical examination without abnormal findings: Secondary | ICD-10-CM | POA: Diagnosis not present

## 2020-03-28 DIAGNOSIS — E7800 Pure hypercholesterolemia, unspecified: Secondary | ICD-10-CM | POA: Diagnosis not present

## 2020-03-28 DIAGNOSIS — E038 Other specified hypothyroidism: Secondary | ICD-10-CM | POA: Diagnosis not present

## 2020-04-04 DIAGNOSIS — Z0000 Encounter for general adult medical examination without abnormal findings: Secondary | ICD-10-CM | POA: Diagnosis not present

## 2020-04-04 DIAGNOSIS — R42 Dizziness and giddiness: Secondary | ICD-10-CM | POA: Diagnosis not present

## 2020-04-04 DIAGNOSIS — Z1331 Encounter for screening for depression: Secondary | ICD-10-CM | POA: Diagnosis not present

## 2020-04-04 DIAGNOSIS — M8580 Other specified disorders of bone density and structure, unspecified site: Secondary | ICD-10-CM | POA: Diagnosis not present

## 2020-04-04 DIAGNOSIS — E063 Autoimmune thyroiditis: Secondary | ICD-10-CM | POA: Diagnosis not present

## 2020-04-04 DIAGNOSIS — G2581 Restless legs syndrome: Secondary | ICD-10-CM | POA: Diagnosis not present

## 2020-04-04 DIAGNOSIS — R82998 Other abnormal findings in urine: Secondary | ICD-10-CM | POA: Diagnosis not present

## 2020-04-04 DIAGNOSIS — M797 Fibromyalgia: Secondary | ICD-10-CM | POA: Diagnosis not present

## 2020-04-04 DIAGNOSIS — R809 Proteinuria, unspecified: Secondary | ICD-10-CM | POA: Diagnosis not present

## 2020-04-04 DIAGNOSIS — G454 Transient global amnesia: Secondary | ICD-10-CM | POA: Diagnosis not present

## 2020-04-04 DIAGNOSIS — B182 Chronic viral hepatitis C: Secondary | ICD-10-CM | POA: Diagnosis not present

## 2020-04-04 DIAGNOSIS — G4731 Primary central sleep apnea: Secondary | ICD-10-CM | POA: Diagnosis not present

## 2020-04-04 DIAGNOSIS — D126 Benign neoplasm of colon, unspecified: Secondary | ICD-10-CM | POA: Diagnosis not present

## 2020-04-10 DIAGNOSIS — Z1212 Encounter for screening for malignant neoplasm of rectum: Secondary | ICD-10-CM | POA: Diagnosis not present

## 2020-04-23 ENCOUNTER — Encounter: Payer: Self-pay | Admitting: Neurology

## 2020-05-07 DIAGNOSIS — M79644 Pain in right finger(s): Secondary | ICD-10-CM | POA: Diagnosis not present

## 2020-05-07 DIAGNOSIS — M79641 Pain in right hand: Secondary | ICD-10-CM | POA: Diagnosis not present

## 2020-05-07 DIAGNOSIS — M1811 Unilateral primary osteoarthritis of first carpometacarpal joint, right hand: Secondary | ICD-10-CM | POA: Diagnosis not present

## 2020-05-15 DIAGNOSIS — F329 Major depressive disorder, single episode, unspecified: Secondary | ICD-10-CM | POA: Diagnosis not present

## 2020-05-15 DIAGNOSIS — M47816 Spondylosis without myelopathy or radiculopathy, lumbar region: Secondary | ICD-10-CM | POA: Diagnosis not present

## 2020-05-15 DIAGNOSIS — M961 Postlaminectomy syndrome, not elsewhere classified: Secondary | ICD-10-CM | POA: Diagnosis not present

## 2020-05-15 DIAGNOSIS — M7918 Myalgia, other site: Secondary | ICD-10-CM | POA: Diagnosis not present

## 2020-06-09 ENCOUNTER — Encounter: Payer: Self-pay | Admitting: Neurology

## 2020-06-09 ENCOUNTER — Ambulatory Visit: Payer: Medicare HMO | Admitting: Neurology

## 2020-06-09 ENCOUNTER — Other Ambulatory Visit: Payer: Self-pay

## 2020-06-09 VITALS — BP 88/51 | HR 82 | Ht 66.0 in | Wt 188.0 lb

## 2020-06-09 DIAGNOSIS — G43009 Migraine without aura, not intractable, without status migrainosus: Secondary | ICD-10-CM | POA: Diagnosis not present

## 2020-06-09 DIAGNOSIS — G454 Transient global amnesia: Secondary | ICD-10-CM

## 2020-06-09 DIAGNOSIS — H8110 Benign paroxysmal vertigo, unspecified ear: Secondary | ICD-10-CM

## 2020-06-09 MED ORDER — RIZATRIPTAN BENZOATE 10 MG PO TABS: ORAL_TABLET | ORAL | 5 refills | Status: DC

## 2020-06-09 MED ORDER — AIMOVIG 70 MG/ML ~~LOC~~ SOAJ: 70.0000 mg | SUBCUTANEOUS | 11 refills | Status: DC

## 2020-06-09 NOTE — Patient Instructions (Signed)
°  1. We will try to start Aimovig 70mg  injection every 28 days 2. Take rizatriptan 10mg  at earliest onset of headache.  May repeat dose once in 2 hours if needed.  Maximum 2 tablets in 24 hours. 3. Limit use of pain relievers to no more than 2 days out of the week.  These medications include acetaminophen, NSAIDs (ibuprofen/Advil/Motrin, naproxen/Aleve, triptans (Imitrex/sumatriptan), Excedrin, and narcotics.  This will help reduce risk of rebound headaches. 4. Be aware of common food triggers:  - Caffeine:  coffee, black tea, cola, Mt. Dew  - Chocolate  - Dairy:  aged cheeses (brie, blue, cheddar, gouda, [place], provolone, [place], Swiss, etc), chocolate milk, buttermilk, sour cream, limit eggs and yogurt  - Nuts, peanut butter  - Alcohol  - Cereals/grains:  FRESH breads (fresh bagels, sourdough, doughnuts), yeast productions  - Processed/canned/aged/cured meats (pre-packaged deli meats, hotdogs)  - MSG/glutamate:  soy sauce, flavor enhancer, pickled/preserved/marinated foods  - Sweeteners:  aspartame (Equal, Nutrasweet).  Sugar and Splenda are okay  - Vegetables:  legumes (lima beans, lentils, snow peas, fava beans, pinto peans, peas, garbanzo beans), sauerkraut, onions, olives, pickles  - Fruit:  avocados, bananas, citrus fruit (orange, lemon, grapefruit), mango  - Other:  Frozen meals, macaroni and cheese 5. Routine exercise 6. Stay adequately hydrated (aim for 64 oz water daily) 7. Keep headache diary 8. Maintain proper stress management 9. Maintain proper sleep hygiene 10. Do not skip meals 11. Consider supplements:  magnesium citrate 400mg  daily, riboflavin 400mg  daily, coenzyme Q10 100mg  three times daily.

## 2020-06-09 NOTE — Progress Notes (Signed)
[name] (Key: [id number]) Rx #: [id number] Aimovig 70MG /ML auto-injectors   Form [employer] Electronic PA Form Created 13 minutes ago Sent to Plan 8 minutes ago Plan Response 8 minutes ago Submit Clinical Questions less than a minute ago Determination Favorable less than a minute ago Message from Plan PA Case: [id number], Status: Approved, Coverage Starts on: 06/09/2020 12:00:00 AM, Coverage Ends on: 09/07/2020 12:00:00 AM. Questions? Contact [phone].

## 2020-06-09 NOTE — Progress Notes (Signed)
NEUROLOGY CONSULTATION NOTE  VIERA OKONSKI MRN: [id number] DOB: 1956/03/23  Referring provider: Domenick Gong, MD Primary care provider: Domenick Gong, MD  Reason for consult:  dizziness  HISTORY OF PRESENT ILLNESS: [name] is a 64 year old right-handed female with HTN, degenerative disc disease of lumbar spine, s/p spinal cord stimulator, and diabetes who presents for dizziness.  History supplemented by referring provider's note.  In April, she was hospitalized and diagnosed with transient global amnesia.  She kept repeating questions and couldn't remember having shoulder surgery the prior month.  She didn't remember her activities for that day.  CT of head was unremarkable (unable to have MRI due to spinal cord stimulator).  EEG was normal.    A couple of months later, she developed vertigo.  When she would walk, she felt like her environment was moving.  If she was in bed, it felt like her bed was turning.  She was coming out of the bathroom and fell.  She did not hit her head.  If she was bent over and came back up, she would feel dizzy, lasting 4 to 6 minutes.  She was on meclizine.  This was ongoing for a couple of months.  Dizziness overall improved, but may have a moment of dizziness for a second when she got up.  She reports prior history of vertigo a couple of years ago, which she attributes to receiving both the flu shot and shingles shot on the same day. She was in the bed for a week before it resolved but nothing as severe as this recent event.    She started having migraines in 1980, after the birth of her daughter.  They are severe and throbbing, either mid-frontal to top of head, back of head or temples.  Associated with nausea, photophobia, phonophobia.  It lasts 30 minutes with rizatriptan and occurs about 7 migraines in 30 days.    Current NSAIDS:  none Current analgesics:  tramadol Current triptans:  Rizatriptan 10mg  (effective but causes drowsiness) Current  ergotamine:  none Current anti-emetic:  none Current muscle relaxants:  Flexeril Current anti-anxiolytic:  none Current sleep aide:  none Current Antihypertensive medications:  Lisinopril-HCTZ Current Antidepressant medications:  Cymbalta 60mg  Current Anticonvulsant medications:  Gabapentin 300mg  TID Current anti-CGRP:  none Current Vitamins/Herbal/Supplements:  none Current Antihistamines/Decongestants:  none Other therapy:  none Hormone/birth control:  none  Other medications:  none  Past NSAIDS:  Ibuprofen, naproxen Past analgesics:  Tylenol Past abortive triptans:  Sumatriptan [place] (hyperventilating, near syncope) Past abortive ergotamine:  none Past muscle relaxants:  none Past anti-emetic:  none Past antihypertensive medications:  none Past antidepressant medications:  amitriptyline Past anticonvulsant medications:  topiramate Past anti-CGRP:  none Past vitamins/Herbal/Supplements:  none Past antihistamines/decongestants: meclizine Other past therapies:  none  Caffeine:  1 cup decaf coffee, 1 cup decaf tea.  No soda Diet:  Drinks mostly water.  Eats two meals a day.  Sometimes may snack Exercise:  no Depression:  yes; Anxiety:  A little bit.  Primary caregiver for her ill husband. Other pain:  Chronic back pain Sleep hygiene:  Takes a couple of hours to fall asleep.  Gets up a couple of times to go to the bathroom Family history of headache:  Mother (brain aneurysm in 1965), brother (migraines), daughter (migraines), granddaughter (migraines)    PAST MEDICAL HISTORY: Past Medical History:  Diagnosis Date  . Arthritis   . DDD (degenerative disc disease), lumbar   . Diabetes mellitus 1-13  patient denies and says never made aware;  BORDERLINE DR. BALAN-diet controlled;   Marland Kitchen GERD (gastroesophageal reflux disease)   . Hepatitis    genotype 1b hepatitis C virus, monitored by [facility] infectious disease group  . History of bronchitis   . Hyperlipidemia   . Hypertension     . Spinal cord stimulator status    placed 2006  . Thyroid disease    takes synthroid    PAST SURGICAL HISTORY: Past Surgical History:  Procedure Laterality Date  . Anterior cervical decompression and fusion with a reflex hyper plate  61/03/736   T0-G2 and C5-C6 , Dr. Maia Plan  . BREAST EXCISIONAL BIOPSY Left 1982  . BREAST SURGERY  1983   left breast cyst removed  . cervical fusion w/OASIS system infuse  05/13/2006   C4-C5 and C5-C6 and C6-7,  Dr. Maia Plan  . CHOLECYSTECTOMY  ?2002  . CHOLECYSTECTOMY  2002  . COLONOSCOPY    . ESOPHAGEAL DILATION     Dr. Amedeo Plenty  . KNEE SURGERY  06/2017  . LAPAROSCOPIC ENDOMETRIOSIS FULGURATION  1980's   Dr. Warnell Forester  . LIVER BIOPSY  2002  . LOWER BACK SURGERY  OCT 2016  . LUMBAR DISC SURGERY  08/13/2003   Diskectomy/fusion L5-S1 rt., Dr. Maia Plan  . lumbar hemilaminectomy  01/1989   L5-S1 on right, Dr. Ardeen Jourdain  . NASAL SINUS SURGERY  10/30/2007   Left upper Sinus lift,  Dr. Mingo Amber  . SHOULDER ARTHROSCOPY WITH SUBACROMIAL DECOMPRESSION Right 12/06/2014   Procedure: SHOULDER ARTHROSCOPY WITH SUBACROMIAL DECOMPRESSION; EXCISION BONE SPUR;  Surgeon: Roseanne Kaufman, MD;  Location: [place];  Service: Orthopedics;  Laterality: Right;  . SHOULDER SURGERY Right 12/06/14   "cleaned up bone spurs"  . SHOULDER SURGERY Right FEB 2016  . SPINAL CORD STIMULATOR INSERTION  12/29/2004   leads placed along spine from T8 to T10, Dr. Maia Plan  . Spinal cord stimulator pulse generator  01/05/2005   placed in right upper buttocks, Dr. Maia Plan  . THYROID LOBECTOMY Right 02/13/2016  . THYROIDECTOMY Right 02/13/2016   Procedure: RIGHT THYROID LOBECTOMY WITH FROZEN SECTION ;  Surgeon: Izora Gala, MD;  Location: [place];  Service: ENT;  Laterality: Right;  . TONSILLECTOMY      MEDICATIONS: Current Outpatient Medications on File Prior to Visit  Medication Sig Dispense Refill  . cyclobenzaprine (FLEXERIL) 10 MG tablet Take 10 mg by mouth every 8 (eight) hours as  needed for muscle spasms.     . DULoxetine (CYMBALTA) 60 MG capsule Take 60 mg by mouth daily.    . fluticasone (FLONASE) 50 MCG/ACT nasal spray Place 2 sprays into the nose daily as needed for allergies.     Marland Kitchen levothyroxine (SYNTHROID, LEVOTHROID) 125 MCG tablet Take 1 tablet by mouth daily with breakfast.    . lisinopril-hydrochlorothiazide (PRINZIDE,ZESTORETIC) 20-12.5 MG per tablet Take 0.5 tablets by mouth every morning.     . pantoprazole (PROTONIX) 40 MG tablet Take 40 mg by mouth daily.    . pravastatin (PRAVACHOL) 20 MG tablet Take 20 mg by mouth daily.     . rizatriptan (MAXALT) 10 MG tablet Take 10 mg by mouth as needed for migraine. May repeat in 2 hours if needed    . traMADol (ULTRAM) 50 MG tablet Take 1 tablet (50 mg total) by mouth every 6 (six) hours as needed. (Patient taking differently: Take 50 mg by mouth every 6 (six) hours as needed. ) 15 tablet 0  . traZODone (DESYREL)  150 MG tablet Take 150 mg by mouth at bedtime.    . Wheat Dextrin (BENEFIBER DRINK MIX PO) Take 10 mLs by mouth in the morning, at noon, and at bedtime.     No current facility-administered medications on file prior to visit.    ALLERGIES: Allergies  Allergen Reactions  . Acetic Acid-Oxyquinoline Other (See Comments)    FEM PH; caused (vaginal) bleeding  . Buprenorphine Hcl Itching, Nausea Only and Rash  . Codeine Nausea Only  . Imitrex [Sumatriptan] Hives    "get hot and hyperventilate"  . Morphine Rash  . Morphine And Related Itching, Nausea Only and Other (See Comments)    Pt states actually causes her pain in stomach  . Pentazocine Other (See Comments)    Caused vaginal bleeding; was given to patient by OB-GYN for pain secondary to GYN issue  . Pentazocine Lactate Other (See Comments)    Caused vaginal bleeding; was given to patient by OB-GYN for pain secondary to GYN issue  . Sumatriptan Succinate Hives    Hyperventilation "get hot and hyperventilate"    FAMILY HISTORY: Family History    Problem Relation Age of Onset  . Hypertension Father   . Cancer Father        colon, liver  . Atrial fibrillation Father   . Hypertension Sister   . Hypertension Brother   . Cancer Brother        lung, adrenal glands and brain  . Hypertension Brother   . Aneurysm Mother        brain  . Stroke Mother 19    SOCIAL HISTORY: Social History   Socioeconomic History  . Marital status: Married    Spouse name: Not on file  . Number of children: Not on file  . Years of education: Not on file  . Highest education level: Not on file  Occupational History  . Not on file  Tobacco Use  . Smoking status: Former Smoker    Quit date: 1992    Years since quitting: 29.6  . Smokeless tobacco: Never Used  . Tobacco comment: quit smoking in 1992  Vaping Use  . Vaping Use: Never used  Substance and Sexual Activity  . Alcohol use: Yes    Alcohol/week: 0.0 standard drinks    Comment: occ  . Drug use: No  . Sexual activity: Yes    Birth control/protection: None  Other Topics Concern  . Not on file  Social History Narrative   Walks the dog exercise.  Has to stop breath while walking the dog.    Drinks about 3 caffeine drinks a week    Currently disabled after 8 back surgeries with spinal cord/injury   Social Determinants of Health   Financial Resource Strain:   . Difficulty of Paying Living Expenses: Not on file  Food Insecurity:   . Worried About Charity fundraiser in the Last Year: Not on file  . Ran Out of Food in the Last Year: Not on file  Transportation Needs:   . Lack of Transportation (Medical): Not on file  . Lack of Transportation (Non-Medical): Not on file  Physical Activity:   . Days of Exercise per Week: Not on file  . Minutes of Exercise per Session: Not on file  Stress:   . Feeling of Stress : Not on file  Social Connections:   . Frequency of Communication with Friends and Family: Not on file  . Frequency of Social Gatherings with Friends and Family: Not on  file   . Attends Religious Services: Not on file  . Active Member of Clubs or Organizations: Not on file  . Attends Archivist Meetings: Not on file  . Marital Status: Not on file  Intimate Partner Violence:   . Fear of Current or Ex-Partner: Not on file  . Emotionally Abused: Not on file  . Physically Abused: Not on file  . Sexually Abused: Not on file    PHYSICAL EXAM: Blood pressure (!) 88/51, pulse 82, height 5\' 6"  (1.676 m), weight 188 lb (85.3 kg), last menstrual period 06/24/2012, SpO2 97 %. General: No acute distress.  Patient appears well-groomed.   Head:  Normocephalic/atraumatic Eyes:  fundi examined but not visualized Neck: supple, no paraspinal tenderness, full range of motion Back: No paraspinal tenderness Heart: regular rate and rhythm Lungs: Clear to auscultation bilaterally. Vascular: No carotid bruits. Neurological Exam: Mental status: alert and oriented to person, place, and time, recent and remote memory intact, fund of knowledge intact, attention and concentration intact, speech fluent and not dysarthric, language intact. Cranial nerves: CN I: not tested CN II: pupils equal, round and reactive to light, visual fields intact CN III, IV, VI:  full range of motion, no nystagmus, no ptosis CN V: facial sensation intact CN VII: upper and lower face symmetric CN VIII: hearing intact CN IX, X: gag intact, uvula midline CN XI: sternocleidomastoid and trapezius muscles intact CN XII: tongue midline Bulk & Tone: normal, no fasciculations. Motor:  5/5 throughout  Sensation:  Pinprick and vibration sensation intact. Deep Tendon Reflexes:  2+ throughout, toes downgoing.  Finger to nose testing:  Without dysmetria.   Heel to shin:  Without dysmetria.   Gait:  Normal station and stride.  Able to turn and tandem walk. Romberg negative.  IMPRESSION: 1.  Migraine without aura, without status migrainosus, not intractable 2.  Transient global amnesia 3.  Benign  paroxysmal positional vertigo  PLAN: 1.  For preventative management, start Aimovig 70mg  every 28 days 2.  For abortive therapy, rizatriptan 10mg  3.  Limit use of pain relievers to no more than 2 days out of week to prevent risk of rebound or medication-overuse headache. 4.  Keep headache diary 5.  Exercise, hydration, caffeine cessation, sleep hygiene, monitor for and avoid triggers 6.  Follow up 6 months    This referral was for a second opinion and not an agreement to accept ongoing treatment.  Metta Clines, DO  CC:  Domenick Gong, MD

## 2020-06-17 ENCOUNTER — Telehealth: Payer: Self-pay | Admitting: Neurology

## 2020-06-17 NOTE — Telephone Encounter (Signed)
Pt will have insurance refax over the paperwork

## 2020-06-17 NOTE — Telephone Encounter (Signed)
I have not received anything from her insurance regarding this.

## 2020-06-17 NOTE — Telephone Encounter (Signed)
Patient states that Dr Tomi Likens wanted patient to be on the injection Aimovig and the cost is  97 dollars a month and her insurance was going to fax over the paper to get the tier  Lower for a lower cost and she would like to speak to someone  Please call

## 2020-06-20 ENCOUNTER — Telehealth: Payer: Self-pay | Admitting: Neurology

## 2020-06-25 DIAGNOSIS — M47816 Spondylosis without myelopathy or radiculopathy, lumbar region: Secondary | ICD-10-CM | POA: Diagnosis not present

## 2020-06-25 DIAGNOSIS — R030 Elevated blood-pressure reading, without diagnosis of hypertension: Secondary | ICD-10-CM | POA: Diagnosis not present

## 2020-06-25 DIAGNOSIS — Z6829 Body mass index (BMI) 29.0-29.9, adult: Secondary | ICD-10-CM | POA: Diagnosis not present

## 2020-07-03 ENCOUNTER — Telehealth: Payer: Self-pay

## 2020-07-03 NOTE — Progress Notes (Signed)
Fax received from pt insurance.  Tier Exception for Aimovig denied.

## 2020-07-03 NOTE — Telephone Encounter (Signed)
Telephone call to pt, LMOVM Aimovig Tier Exception denied.

## 2020-07-03 NOTE — Telephone Encounter (Signed)
I would like to appeal for the following reasons:  She has failed antidepressants:  amitriptyline, duloxetine She has failed antiepileptic medications:  topiramate, gabapentin She has low blood pressure at baseline and therefore cannot start a beta blocker.

## 2020-07-03 NOTE — Telephone Encounter (Signed)
Advised pt of her Insurance denial of her Aimovig tier exception.   DR. Tomi Likens please advise next steps

## 2020-07-08 NOTE — Telephone Encounter (Signed)
I have faxed over the Appeal paperwork for the denial of the Tier Exception request to [id number]. Awaiting determination.

## 2020-07-09 DIAGNOSIS — M25561 Pain in right knee: Secondary | ICD-10-CM | POA: Diagnosis not present

## 2020-07-10 ENCOUNTER — Other Ambulatory Visit: Payer: Self-pay | Admitting: Orthopedic Surgery

## 2020-07-10 DIAGNOSIS — M25561 Pain in right knee: Secondary | ICD-10-CM

## 2020-07-11 NOTE — Progress Notes (Signed)
Faxed over appeal for the denial of Tier Exception for patient's Aimovig medication. Received denial of appeal through fax. Spoke with Rep @ [employer] and unable to do another level of appeal or peer-to-peer due to medicare guidelines as the reason for denial. Sent Appeal Denial to scanning.

## 2020-07-16 ENCOUNTER — Ambulatory Visit: Payer: Medicare HMO | Admitting: Neurology

## 2020-07-24 ENCOUNTER — Ambulatory Visit
Admission: RE | Admit: 2020-07-24 | Discharge: 2020-07-24 | Disposition: A | Discharge status: Home / Self Care | Payer: Medicare HMO | Source: Ambulatory Visit | Attending: Orthopedic Surgery | Admitting: Orthopedic Surgery

## 2020-07-24 ENCOUNTER — Other Ambulatory Visit: Payer: Self-pay | Admitting: Orthopedic Surgery

## 2020-07-24 DIAGNOSIS — M25561 Pain in right knee: Secondary | ICD-10-CM

## 2020-07-24 DIAGNOSIS — M1711 Unilateral primary osteoarthritis, right knee: Secondary | ICD-10-CM | POA: Diagnosis not present

## 2020-08-14 DIAGNOSIS — M47816 Spondylosis without myelopathy or radiculopathy, lumbar region: Secondary | ICD-10-CM | POA: Diagnosis not present

## 2020-08-14 DIAGNOSIS — F411 Generalized anxiety disorder: Secondary | ICD-10-CM | POA: Diagnosis not present

## 2020-08-14 DIAGNOSIS — M7918 Myalgia, other site: Secondary | ICD-10-CM | POA: Diagnosis not present

## 2020-08-14 DIAGNOSIS — M961 Postlaminectomy syndrome, not elsewhere classified: Secondary | ICD-10-CM | POA: Diagnosis not present

## 2020-08-19 ENCOUNTER — Other Ambulatory Visit: Payer: Self-pay

## 2020-08-19 ENCOUNTER — Ambulatory Visit: Payer: Medicare HMO | Admitting: Nurse Practitioner

## 2020-08-19 ENCOUNTER — Encounter: Payer: Self-pay | Admitting: Nurse Practitioner

## 2020-08-19 VITALS — BP 122/80

## 2020-08-19 DIAGNOSIS — N898 Other specified noninflammatory disorders of vagina: Secondary | ICD-10-CM

## 2020-08-19 DIAGNOSIS — N952 Postmenopausal atrophic vaginitis: Secondary | ICD-10-CM | POA: Diagnosis not present

## 2020-08-19 LAB — WET PREP FOR TRICH, YEAST, CLUE

## 2020-08-19 NOTE — Patient Instructions (Signed)
Atrophic Vaginitis  Atrophic vaginitis is a condition in which the tissues that line the vagina become dry and thin. This condition is most common in women who have stopped having regular menstrual periods (are in menopause). This usually starts when a woman is 45-64 years old. That is the time when a woman's estrogen levels begin to drop (decrease). Estrogen is a female hormone. It helps to keep the tissues of the vagina moist. It stimulates the vagina to produce a clear fluid that lubricates the vagina for sexual intercourse. This fluid also protects the vagina from infection. Lack of estrogen can cause the lining of the vagina to get thinner and dryer. The vagina may also shrink in size. It may become less elastic. Atrophic vaginitis tends to get worse over time as a woman's estrogen level drops. What are the causes? This condition is caused by the normal drop in estrogen that happens around the time of menopause. What increases the risk? Certain conditions or situations may lower a woman's estrogen level, leading to a higher risk for atrophic vaginitis. You are more likely to develop this condition if:  You are taking medicines that block estrogen.  You have had your ovaries removed.  You are being treated for cancer with X-ray (radiation) or medicines (chemotherapy).  You have given birth or are breastfeeding.  You are older than age 50.  You smoke. What are the signs or symptoms? Symptoms of this condition include:  Pain, soreness, or bleeding during sexual intercourse (dyspareunia).  Vaginal burning, irritation, or itching.  Pain or bleeding when a speculum is used in a vaginal exam (pelvic exam).  Having burning pain when passing urine.  Vaginal discharge that is brown or yellow. In some cases, there are no symptoms. How is this diagnosed? This condition is diagnosed by taking a medical history and doing a physical exam. This will include a pelvic exam that checks the  vaginal tissues. Though rare, you may also have other tests, including:  A urine test.  A test that checks the acid balance in your vagina (acid balance test). How is this treated? Treatment for this condition depends on how severe your symptoms are. Treatment may include:  Using an over-the-counter vaginal lubricant before sex.  Using a long-acting vaginal moisturizer.  Using low-dose vaginal estrogen for moderate to severe symptoms that do not respond to other treatments. Options include creams, tablets, and inserts (vaginal rings). Before you use a vaginal estrogen, tell your health care provider if you have a history of: ? Breast cancer. ? Endometrial cancer. ? Blood clots. If you are not sexually active and your symptoms are very mild, you may not need treatment. Follow these instructions at home: Medicines  Take over-the-counter and prescription medicines only as told by your health care provider. Do not use herbal or alternative medicines unless your health care provider says that you can.  Use over-the-counter creams, lubricants, or moisturizers for dryness only as directed by your health care provider. General instructions  If your atrophic vaginitis is caused by menopause, discuss all of your menopause symptoms and treatment options with your health care provider.  Do not douche.  Do not use products that can make your vagina dry. These include: ? Scented feminine sprays. ? Scented tampons. ? Scented soaps.  Vaginal intercourse can help to improve blood flow and elasticity of vaginal tissue. If it hurts to have sex, try using a lubricant or moisturizer just before having intercourse. Contact a health care provider if:    Your discharge looks different than normal.  Your vagina has an unusual smell.  You have new symptoms.  Your symptoms do not improve with treatment.  Your symptoms get worse. Summary  Atrophic vaginitis is a condition in which the tissues that  line the vagina become dry and thin. It is most common in women who have stopped having regular menstrual periods (are in menopause).  Treatment options include using vaginal lubricants and low-dose vaginal estrogen.  Contact a health care provider if your vagina has an unusual smell, or if your symptoms get worse or do not improve after treatment. This information is not intended to replace advice given to you by your health care provider. Make sure you discuss any questions you have with your health care provider. Document Revised: 09/16/2017 Document Reviewed: 06/30/2017 Elsevier Patient Education  2020 Elsevier Inc.  

## 2020-08-19 NOTE — Progress Notes (Signed)
   Acute Office Visit  Subjective:    Patient ID: [name], female    DOB: [birth year], 64 y.o.   MRN: [id number]   HPI 64 y.o. presents today for externalvaginal itching and dryness that started 2 weeks ago. She was on Estradiol vaginal cream in the past with some relief of postmenopausal dryness. Denies urinary symptoms.    Review of Systems  Constitutional: Negative.   Genitourinary: Negative for vaginal discharge.       Vaginal dryness, vaginal itching       Objective:    Physical Exam Constitutional:      Appearance: Normal appearance.  Genitourinary:    General: Normal vulva.     Vagina: Erythema and tenderness present. No vaginal discharge.     BP 122/80 (BP Location: Right Arm, Patient Position: Sitting, Cuff Size: Normal)   LMP 06/24/2012  Wt Readings from Last 3 Encounters:  06/09/20 188 lb (85.3 kg)  02/06/20 187 lb (84.8 kg)  01/17/20 184 lb (83.5 kg)   Wet prep negative      Assessment & Plan:   Problem List Items Addressed This Visit    None    Visit Diagnoses    Atrophic vaginitis    -  Primary   Vaginal itching       Relevant Orders   WET PREP FOR [place], YEAST, CLUE     Plan: Reassurance provided on negative wet prep and that symptoms are most likely related to atrophic vaginitis.  We discussed OTC options versus restarting estradiol vaginal cream.  We both agree that trying OTC Replens 2-3 times per week for maintenance moisture should be tried first.  She will return if symptoms worsen or do not improve after a couple of months.  She is agreeable to plan.     Tamela Gammon [facility], 12:22 PM 08/19/2020

## 2020-08-20 DIAGNOSIS — K006 Disturbances in tooth eruption: Secondary | ICD-10-CM | POA: Diagnosis not present

## 2020-08-26 DIAGNOSIS — K006 Disturbances in tooth eruption: Secondary | ICD-10-CM | POA: Diagnosis not present

## 2020-08-27 DIAGNOSIS — E039 Hypothyroidism, unspecified: Secondary | ICD-10-CM | POA: Diagnosis not present

## 2020-09-02 DIAGNOSIS — H2513 Age-related nuclear cataract, bilateral: Secondary | ICD-10-CM | POA: Diagnosis not present

## 2020-09-08 ENCOUNTER — Encounter: Payer: Self-pay | Admitting: Neurology

## 2020-09-08 NOTE — Progress Notes (Signed)
Approval valid from 09/08/20 to 10/17/21.

## 2020-10-23 DIAGNOSIS — N1831 Chronic kidney disease, stage 3a: Secondary | ICD-10-CM | POA: Diagnosis not present

## 2020-10-23 DIAGNOSIS — G4731 Primary central sleep apnea: Secondary | ICD-10-CM | POA: Diagnosis not present

## 2020-10-23 DIAGNOSIS — F411 Generalized anxiety disorder: Secondary | ICD-10-CM | POA: Diagnosis not present

## 2020-10-23 DIAGNOSIS — M797 Fibromyalgia: Secondary | ICD-10-CM | POA: Diagnosis not present

## 2020-10-23 DIAGNOSIS — E6609 Other obesity due to excess calories: Secondary | ICD-10-CM | POA: Diagnosis not present

## 2020-10-23 DIAGNOSIS — E063 Autoimmune thyroiditis: Secondary | ICD-10-CM | POA: Diagnosis not present

## 2020-10-23 DIAGNOSIS — R809 Proteinuria, unspecified: Secondary | ICD-10-CM | POA: Diagnosis not present

## 2020-10-23 DIAGNOSIS — F331 Major depressive disorder, recurrent, moderate: Secondary | ICD-10-CM | POA: Diagnosis not present

## 2020-10-23 DIAGNOSIS — I120 Hypertensive chronic kidney disease with stage 5 chronic kidney disease or end stage renal disease: Secondary | ICD-10-CM | POA: Diagnosis not present

## 2020-12-01 DIAGNOSIS — L738 Other specified follicular disorders: Secondary | ICD-10-CM | POA: Diagnosis not present

## 2020-12-01 DIAGNOSIS — L111 Transient acantholytic dermatosis [Grover]: Secondary | ICD-10-CM | POA: Diagnosis not present

## 2020-12-03 DIAGNOSIS — G5761 Lesion of plantar nerve, right lower limb: Secondary | ICD-10-CM | POA: Diagnosis not present

## 2020-12-09 NOTE — Progress Notes (Signed)
NEUROLOGY FOLLOW UP OFFICE NOTE  [name] [id number]  Assessment/Plan:   1.  Migraine without aura, without status migrainosus, not intractable  1.  Migraine prevention:  Medication not indicated 2.  Migraine rescue:  Rizatriptan 10mg  3.  Limit use of pain relievers to no more than 2 days out of week to prevent risk of rebound or medication-overuse headache. 4.  Keep headache diary 5.  Follow up 6 months.  Subjective:  [name] is a 65 year old right-handed female with HTN, degenerative disc disease of lumbar spine, s/p spinal cord stimulator, and diabetes who follows up for migraines.  UPDATE: Started Aimovig in August.  However, it was very expensive.  Headaches have been better controlled.   Intensity:  Moderate to severe Duration:  Usually 15-20 minutes.  Postdrome fatigue for a day Frequency:  3 a month  Current NSAIDS:  none Current analgesics:  none Current triptans:  Rizatriptan 10mg  (effective but causes drowsiness) Current ergotamine:  none Current anti-emetic:  none Current muscle relaxants:  Flexeril Current anti-anxiolytic:  none Current sleep aide:  none Current Antihypertensive medications:  Lisinopril-HCTZ Current Antidepressant medications:  trazodone Current Anticonvulsant medications:  Gabapentin 300mg  TID Current anti-CGRP:  Aimovig 70mg  every 28 days Current Vitamins/Herbal/Supplements:  none Current Antihistamines/Decongestants:  none Other therapy:  none Hormone/birth control:  none  Other medications:  none  Caffeine:  1 cup decaf coffee, 1 cup decaf tea.  No soda Diet:  Drinks mostly water.  Eats two meals a day.  Sometimes may snack Exercise:  no Depression:  yes; Anxiety:  yes.  Primary caregiver for her husband.  Recently found out that he doesn't have much longer. Other pain:  Chronic back pain Sleep hygiene:  Takes a couple of hours to fall asleep.  Gets up a couple of times to go to the bathroom  HISTORY: In April 2021,  she was hospitalized and diagnosed with transient global amnesia.  She kept repeating questions and couldn't remember having shoulder surgery the prior month.  She didn't remember her activities for that day.  CT of head was unremarkable (unable to have MRI due to spinal cord stimulator).  EEG was normal.    A couple of months later, she developed vertigo.  When she would walk, she felt like her environment was moving.  If she was in bed, it felt like her bed was turning.  She was coming out of the bathroom and fell.  She did not hit her head.  If she was bent over and came back up, she would feel dizzy, lasting 4 to 6 minutes.  She was on meclizine.  This was ongoing for a couple of months.  Dizziness overall improved, but may have a moment of dizziness for a second when she got up.  She reports prior history of vertigo a couple of years ago, which she attributes to receiving both the flu shot and shingles shot on the same day. She was in the bed for a week before it resolved but nothing as severe as this recent event.    She started having migraines in 1980, after the birth of her daughter.  They are severe and throbbing, either mid-frontal to top of head, back of head or temples.  Associated with nausea, photophobia, phonophobia.  It lasts 30 minutes with rizatriptan and occurs about 7 migraines in 30 days.     Past NSAIDS:  Ibuprofen, naproxen Past analgesics:  Tylenol Past abortive triptans:  Sumatriptan [place] (hyperventilating, near  syncope) Past abortive ergotamine:  none Past muscle relaxants:  none Past anti-emetic:  none Past antihypertensive medications:  none Past antidepressant medications:  amitriptyline, Cymbalta Past anticonvulsant medications:  topiramate Past anti-CGRP:  none Past vitamins/Herbal/Supplements:  none Past antihistamines/decongestants: meclizine Other past therapies:  none   Family history of headache:  Mother (brain aneurysm in 1965), brother (migraines),  daughter (migraines), granddaughter (migraines)  PAST MEDICAL HISTORY: Past Medical History:  Diagnosis Date  . Arthritis   . DDD (degenerative disc disease), lumbar   . Diabetes mellitus 1-13   patient denies and says never made aware;  BORDERLINE DR. BALAN-diet controlled;   Marland Kitchen GERD (gastroesophageal reflux disease)   . Hepatitis    genotype 1b hepatitis C virus, monitored by [facility] infectious disease group  . History of bronchitis   . Hyperlipidemia   . Hypertension   . Spinal cord stimulator status    placed 2006  . Thyroid disease    takes synthroid    MEDICATIONS: Current Outpatient Medications on File Prior to Visit  Medication Sig Dispense Refill  . cyclobenzaprine (FLEXERIL) 10 MG tablet Take 10 mg by mouth every 8 (eight) hours as needed for muscle spasms.     . DULoxetine (CYMBALTA) 60 MG capsule Take 60 mg by mouth daily.    . fluticasone (FLONASE) 50 MCG/ACT nasal spray Place 2 sprays into the nose daily as needed for allergies.     Marland Kitchen gabapentin (NEURONTIN) 300 MG capsule Take 300 mg by mouth 3 (three) times daily.    Marland Kitchen levothyroxine (SYNTHROID, LEVOTHROID) 125 MCG tablet Take 1 tablet by mouth daily with breakfast.    . lisinopril-hydrochlorothiazide (PRINZIDE,ZESTORETIC) 20-12.5 MG per tablet Take 0.5 tablets by mouth every morning.     . pantoprazole (PROTONIX) 40 MG tablet Take 40 mg by mouth daily.    . pravastatin (PRAVACHOL) 20 MG tablet Take 20 mg by mouth daily.     . rizatriptan (MAXALT) 10 MG tablet Take 1 tablet earliest onset of migraine.  May repeat in 2 hours if needed.  Maximum 2 tablets in 24 hours. 10 tablet 5  . traMADol (ULTRAM) 50 MG tablet Take 1 tablet (50 mg total) by mouth every 6 (six) hours as needed. (Patient taking differently: Take 50 mg by mouth every 6 (six) hours as needed. ) 15 tablet 0  . traZODone (DESYREL) 150 MG tablet Take 150 mg by mouth at bedtime.    . Wheat Dextrin (BENEFIBER DRINK MIX PO) Take 10 mLs by mouth in the morning, at  noon, and at bedtime.     No current facility-administered medications on file prior to visit.    ALLERGIES: Allergies  Allergen Reactions  . Acetic Acid-Oxyquinoline Other (See Comments)    FEM PH; caused (vaginal) bleeding  . Buprenorphine Hcl Itching, Nausea Only and Rash  . Codeine Nausea Only  . Imitrex [Sumatriptan] Hives    "get hot and hyperventilate"  . Morphine Rash  . Morphine And Related Itching, Nausea Only and Other (See Comments)    Pt states actually causes her pain in stomach  . Pentazocine Other (See Comments)    Caused vaginal bleeding; was given to patient by OB-GYN for pain secondary to GYN issue  . Pentazocine Lactate Other (See Comments)    Caused vaginal bleeding; was given to patient by OB-GYN for pain secondary to GYN issue  . Sumatriptan Succinate Hives    Hyperventilation "get hot and hyperventilate"    FAMILY HISTORY: Family History  Problem Relation Age  of Onset  . Hypertension Father   . Cancer Father        colon, liver  . Atrial fibrillation Father   . Hypertension Sister   . Hypertension Brother   . Cancer Brother        lung, adrenal glands and brain  . Hypertension Brother   . Aneurysm Mother        brain  . Stroke Mother 34     Objective:  Blood pressure 126/68, pulse 78, height 5\' 6"  (1.676 m), weight 185 lb 6.4 oz (84.1 kg), last menstrual period 06/24/2012, SpO2 96 %. General: No acute distress.  Patient appears well-groomed.       Metta Clines, DO  CC:  Domenick Gong, MD

## 2020-12-10 ENCOUNTER — Ambulatory Visit: Payer: Medicare HMO | Admitting: Neurology

## 2020-12-10 ENCOUNTER — Other Ambulatory Visit: Payer: Self-pay

## 2020-12-10 ENCOUNTER — Encounter: Payer: Self-pay | Admitting: Neurology

## 2020-12-10 VITALS — BP 126/68 | HR 78 | Ht 66.0 in | Wt 185.4 lb

## 2020-12-10 DIAGNOSIS — G43009 Migraine without aura, not intractable, without status migrainosus: Secondary | ICD-10-CM

## 2020-12-10 NOTE — Patient Instructions (Signed)
1.  Continue rizatriptan as directed 2.  Limit use of pain relievers to no more than 2 days out of week to prevent risk of rebound or medication-overuse headache. 3.  Keep headache diary 4.  Follow up in 6 months

## 2020-12-11 ENCOUNTER — Encounter: Payer: Self-pay | Admitting: Neurology

## 2020-12-11 NOTE — Progress Notes (Signed)
Rizatriptan approved valid until 10/17/21.

## 2020-12-16 DIAGNOSIS — R432 Parageusia: Secondary | ICD-10-CM | POA: Diagnosis not present

## 2020-12-16 DIAGNOSIS — R232 Flushing: Secondary | ICD-10-CM | POA: Diagnosis not present

## 2020-12-16 DIAGNOSIS — F331 Major depressive disorder, recurrent, moderate: Secondary | ICD-10-CM | POA: Diagnosis not present

## 2020-12-16 DIAGNOSIS — E039 Hypothyroidism, unspecified: Secondary | ICD-10-CM | POA: Diagnosis not present

## 2020-12-16 DIAGNOSIS — Z1152 Encounter for screening for COVID-19: Secondary | ICD-10-CM | POA: Diagnosis not present

## 2020-12-17 DIAGNOSIS — M21961 Unspecified acquired deformity of right lower leg: Secondary | ICD-10-CM | POA: Diagnosis not present

## 2020-12-17 DIAGNOSIS — M21962 Unspecified acquired deformity of left lower leg: Secondary | ICD-10-CM | POA: Diagnosis not present

## 2020-12-17 DIAGNOSIS — M792 Neuralgia and neuritis, unspecified: Secondary | ICD-10-CM | POA: Diagnosis not present

## 2020-12-17 DIAGNOSIS — M7751 Other enthesopathy of right foot: Secondary | ICD-10-CM | POA: Diagnosis not present

## 2020-12-17 DIAGNOSIS — Q6672 Congenital pes cavus, left foot: Secondary | ICD-10-CM | POA: Diagnosis not present

## 2020-12-24 ENCOUNTER — Encounter: Payer: Self-pay | Admitting: Nurse Practitioner

## 2020-12-24 ENCOUNTER — Ambulatory Visit: Payer: Medicare HMO | Admitting: Nurse Practitioner

## 2020-12-24 ENCOUNTER — Other Ambulatory Visit: Payer: Self-pay

## 2020-12-24 ENCOUNTER — Encounter: Payer: Self-pay | Admitting: Obstetrics & Gynecology

## 2020-12-24 VITALS — BP 130/80

## 2020-12-24 DIAGNOSIS — R5383 Other fatigue: Secondary | ICD-10-CM | POA: Diagnosis not present

## 2020-12-24 DIAGNOSIS — R232 Flushing: Secondary | ICD-10-CM | POA: Diagnosis not present

## 2020-12-24 DIAGNOSIS — E039 Hypothyroidism, unspecified: Secondary | ICD-10-CM | POA: Diagnosis not present

## 2020-12-24 NOTE — Progress Notes (Signed)
   Acute Office Visit  Subjective:    Patient ID: [name], female    DOB: [birth year], 65 y.o.   MRN: [id number]   HPI 65 y.o. presents today for hot flushes, fatigue, and depressive mood that started a few weeks ago. She does have hypothyroidism that is managed by PCP (we do not have records of recent visits). She reports Synthroid was decreased to 112 mcg months ago, then was rechecked and says TSH was "off" but dose was not adjusted. She saw Dr. Chalmers Cater with endocrinology in the past and would like a referral back there.    Review of Systems  Constitutional: Positive for diaphoresis and fatigue.  Endocrine: Positive for heat intolerance.  Psychiatric/Behavioral: Positive for dysphoric mood.       Objective:    Physical Exam Constitutional:      Appearance: Normal appearance.  Skin:    General: Skin is warm and moist.     BP 130/80   LMP 06/24/2012  Wt Readings from Last 3 Encounters:  12/10/20 185 lb 6.4 oz (84.1 kg)  06/09/20 188 lb (85.3 kg)  02/06/20 187 lb (84.8 kg)         Assessment & Plan:   Problem List Items Addressed This Visit   None   Visit Diagnoses    Hot flashes    -  Primary   Relevant Orders   TSH   Hypothyroidism, unspecified type       Relevant Medications   levothyroxine (SYNTHROID) 112 MCG tablet   Other Relevant Orders   TSH   Fatigue, unspecified type       Relevant Orders   TSH     Plan: TSH today and we will adjust Synthroid as appropriate and send referral to endocrinology per request. She is agreeable to plan.     Tamela Gammon [facility], 2:51 PM 12/24/2020

## 2020-12-25 ENCOUNTER — Telehealth: Payer: Self-pay | Admitting: *Deleted

## 2020-12-25 ENCOUNTER — Other Ambulatory Visit: Payer: Self-pay | Admitting: Nurse Practitioner

## 2020-12-25 DIAGNOSIS — E039 Hypothyroidism, unspecified: Secondary | ICD-10-CM

## 2020-12-25 LAB — TSH: TSH: 7.84 mIU/L — ABNORMAL HIGH (ref 0.40–4.50)

## 2020-12-25 MED ORDER — LEVOTHYROXINE SODIUM 125 MCG PO TABS: 125.0000 ug | ORAL_TABLET | Freq: Every day | ORAL | 0 refills | Status: DC

## 2020-12-25 NOTE — Telephone Encounter (Signed)
-----   Message from Tamela Gammon, NP sent at 12/24/2020  2:54 PM EST ----- Please send referral to Dr. Chalmers Cater with endocrinology for management of hypothyroidism. She was a patient there in the past but it has been years. Thank you.

## 2020-12-25 NOTE — Telephone Encounter (Signed)
Office notes faxes to [place] office at [phone], they will call to schedule. Left detailed message on patient voicemail this was done.

## 2020-12-29 ENCOUNTER — Other Ambulatory Visit: Payer: Self-pay | Admitting: Internal Medicine

## 2020-12-29 ENCOUNTER — Other Ambulatory Visit: Payer: Self-pay | Admitting: Nurse Practitioner

## 2020-12-29 DIAGNOSIS — Z1231 Encounter for screening mammogram for malignant neoplasm of breast: Secondary | ICD-10-CM

## 2021-01-07 DIAGNOSIS — M7751 Other enthesopathy of right foot: Secondary | ICD-10-CM | POA: Diagnosis not present

## 2021-01-07 DIAGNOSIS — G5761 Lesion of plantar nerve, right lower limb: Secondary | ICD-10-CM | POA: Diagnosis not present

## 2021-01-07 DIAGNOSIS — M21962 Unspecified acquired deformity of left lower leg: Secondary | ICD-10-CM | POA: Diagnosis not present

## 2021-01-07 DIAGNOSIS — M21961 Unspecified acquired deformity of right lower leg: Secondary | ICD-10-CM | POA: Diagnosis not present

## 2021-01-08 NOTE — Telephone Encounter (Signed)
Patient scheduled on 01/13/21 @ 1:00pm

## 2021-01-12 DIAGNOSIS — E039 Hypothyroidism, unspecified: Secondary | ICD-10-CM | POA: Diagnosis not present

## 2021-01-12 DIAGNOSIS — I10 Essential (primary) hypertension: Secondary | ICD-10-CM | POA: Diagnosis not present

## 2021-01-12 DIAGNOSIS — R7301 Impaired fasting glucose: Secondary | ICD-10-CM | POA: Diagnosis not present

## 2021-01-12 DIAGNOSIS — E049 Nontoxic goiter, unspecified: Secondary | ICD-10-CM | POA: Diagnosis not present

## 2021-01-26 DIAGNOSIS — F331 Major depressive disorder, recurrent, moderate: Secondary | ICD-10-CM | POA: Diagnosis not present

## 2021-01-26 DIAGNOSIS — F410 Panic disorder [episodic paroxysmal anxiety] without agoraphobia: Secondary | ICD-10-CM | POA: Diagnosis not present

## 2021-01-27 ENCOUNTER — Ambulatory Visit: Payer: Medicare HMO | Admitting: Nurse Practitioner

## 2021-01-27 DIAGNOSIS — F331 Major depressive disorder, recurrent, moderate: Secondary | ICD-10-CM | POA: Diagnosis not present

## 2021-01-27 DIAGNOSIS — Z79891 Long term (current) use of opiate analgesic: Secondary | ICD-10-CM | POA: Diagnosis not present

## 2021-01-28 ENCOUNTER — Ambulatory Visit: Payer: Medicare HMO | Admitting: Nurse Practitioner

## 2021-01-28 ENCOUNTER — Encounter: Payer: Self-pay | Admitting: Nurse Practitioner

## 2021-01-28 ENCOUNTER — Other Ambulatory Visit: Payer: Self-pay

## 2021-01-28 VITALS — BP 122/76 | Ht 66.0 in | Wt 188.0 lb

## 2021-01-28 DIAGNOSIS — Z01419 Encounter for gynecological examination (general) (routine) without abnormal findings: Secondary | ICD-10-CM | POA: Diagnosis not present

## 2021-01-28 DIAGNOSIS — M8588 Other specified disorders of bone density and structure, other site: Secondary | ICD-10-CM

## 2021-01-28 NOTE — Patient Instructions (Signed)

## 2021-01-28 NOTE — Progress Notes (Signed)
   [name] 01/07/1956 [id number]   History:  65 y.o. G1P0001 presents for breast and pelvic exam. No GYN complaints. Postmenopausal - no HRT, no bleeding. Normal pap and mammogram history. Hypothyroidism managed by endocrinology. Mild osteopenia managed by PCP. Father deceased from colon cancer. She was recently started on Zoloft and is seeing a therapist. Her husband has a terminal lung disease and she is his caregiver.  Having surgery for Morton's Neuroma in June 2022.   Gynecologic History Patient's last menstrual period was 06/24/2012.   Contraception: post menopausal status  Health Maintenance Last Pap: 03/23/2017. Results were: normal Last mammogram: 12/24/2019. Results were: normal Last colonoscopy: 2020. Results were: 11 polyps, 3-year recall Last Dexa: 2014. Results were: T-score -1.1  Past medical history, past surgical history, family history and social history were all reviewed and documented in the EPIC chart. Married. 1 daughter - graduating nursing school May 2022, 2 daughters ages 27 and 37.   ROS:  A ROS was performed and pertinent positives and negatives are included.  Exam:  Vitals:   01/28/21 1147  BP: 122/76  Weight: 188 lb (85.3 kg)  Height: 5\' 6"  (1.676 m)   Body mass index is 30.34 kg/m.  General appearance:  Normal Thyroid:  Symmetrical, normal in size, without palpable masses or nodularity. Respiratory  Auscultation:  Clear without wheezing or rhonchi Cardiovascular  Auscultation:  Regular rate, without rubs, murmurs or gallops  Edema/varicosities:  Not grossly evident Abdominal  Soft,nontender, without masses, guarding or rebound.  Liver/spleen:  No organomegaly noted  Hernia:  None appreciated  Skin  Inspection:  Grossly normal   Breasts: Examined lying and sitting.   Right: Without masses, retractions, discharge or axillary adenopathy.   Left: Without masses, retractions, discharge or axillary adenopathy. Gentitourinary   Inguinal/mons:   Normal without inguinal adenopathy  External genitalia:  Normal  BUS/Urethra/Skene's glands:  Normal  Vagina:  Atrophic changes  Cervix:  Normal  Uterus:  Normal in size, shape and contour.  Midline and mobile  Adnexa/parametria:     Rt: Without masses or tenderness.   Lt: Without masses or tenderness.  Anus and perineum: Normal  Digital rectal exam: Normal sphincter tone without palpated masses or tenderness  Assessment/Plan:  65 y.o. G1P0001 for breast and pelvic exam.   Well female exam with routine gynecological exam - Education provided on SBEs, importance of preventative screenings, current guidelines, high calcium diet, regular exercise, and multivitamin daily. Labs with PCP.  Osteopenia of spine - 2014 T-score -1.1 at spine. PCP managing this. She is scheduled for repeat Dexa this summer. Taking daily Vitamin D supplement.   Screening for cervical cancer - Normal Pap history.  Will repeat at 5-year interval per guidelines.  Screening for breast cancer - Normal mammogram history.  Continue annual screenings.  Normal breast exam today. She has mammogram scheduled for 02/19/2021.  Screening for colon cancer - 2020 colonoscopy - 11 polyps with 3-year recall recommended. Father deceased from colon cancer.   Return in 1 year for annual.     Tamela Gammon DNP, 12:07 PM 01/28/2021

## 2021-02-03 DIAGNOSIS — F331 Major depressive disorder, recurrent, moderate: Secondary | ICD-10-CM | POA: Diagnosis not present

## 2021-02-03 DIAGNOSIS — M961 Postlaminectomy syndrome, not elsewhere classified: Secondary | ICD-10-CM | POA: Diagnosis not present

## 2021-02-03 DIAGNOSIS — F329 Major depressive disorder, single episode, unspecified: Secondary | ICD-10-CM | POA: Diagnosis not present

## 2021-02-03 DIAGNOSIS — M7918 Myalgia, other site: Secondary | ICD-10-CM | POA: Diagnosis not present

## 2021-02-03 DIAGNOSIS — M2242 Chondromalacia patellae, left knee: Secondary | ICD-10-CM | POA: Diagnosis not present

## 2021-02-03 DIAGNOSIS — Z79899 Other long term (current) drug therapy: Secondary | ICD-10-CM | POA: Diagnosis not present

## 2021-02-03 DIAGNOSIS — F410 Panic disorder [episodic paroxysmal anxiety] without agoraphobia: Secondary | ICD-10-CM | POA: Diagnosis not present

## 2021-02-14 DIAGNOSIS — I129 Hypertensive chronic kidney disease with stage 1 through stage 4 chronic kidney disease, or unspecified chronic kidney disease: Secondary | ICD-10-CM | POA: Diagnosis not present

## 2021-02-14 DIAGNOSIS — E039 Hypothyroidism, unspecified: Secondary | ICD-10-CM | POA: Diagnosis not present

## 2021-02-14 DIAGNOSIS — E7800 Pure hypercholesterolemia, unspecified: Secondary | ICD-10-CM | POA: Diagnosis not present

## 2021-02-14 DIAGNOSIS — N1831 Chronic kidney disease, stage 3a: Secondary | ICD-10-CM | POA: Diagnosis not present

## 2021-02-16 DIAGNOSIS — S83242A Other tear of medial meniscus, current injury, left knee, initial encounter: Secondary | ICD-10-CM | POA: Diagnosis not present

## 2021-02-17 ENCOUNTER — Other Ambulatory Visit: Payer: Self-pay | Admitting: Orthopedic Surgery

## 2021-02-17 DIAGNOSIS — M25562 Pain in left knee: Secondary | ICD-10-CM

## 2021-02-18 DIAGNOSIS — M25572 Pain in left ankle and joints of left foot: Secondary | ICD-10-CM | POA: Diagnosis not present

## 2021-02-19 ENCOUNTER — Other Ambulatory Visit: Payer: Self-pay

## 2021-02-19 ENCOUNTER — Ambulatory Visit
Admission: RE | Admit: 2021-02-19 | Discharge: 2021-02-19 | Disposition: A | Discharge status: Home / Self Care | Payer: Medicare HMO | Source: Ambulatory Visit | Attending: Nurse Practitioner | Admitting: Nurse Practitioner

## 2021-02-19 DIAGNOSIS — Z1231 Encounter for screening mammogram for malignant neoplasm of breast: Secondary | ICD-10-CM

## 2021-02-23 DIAGNOSIS — E039 Hypothyroidism, unspecified: Secondary | ICD-10-CM | POA: Diagnosis not present

## 2021-02-24 DIAGNOSIS — M76822 Posterior tibial tendinitis, left leg: Secondary | ICD-10-CM | POA: Diagnosis not present

## 2021-02-26 DIAGNOSIS — F331 Major depressive disorder, recurrent, moderate: Secondary | ICD-10-CM | POA: Diagnosis not present

## 2021-02-26 DIAGNOSIS — F410 Panic disorder [episodic paroxysmal anxiety] without agoraphobia: Secondary | ICD-10-CM | POA: Diagnosis not present

## 2021-03-02 ENCOUNTER — Ambulatory Visit
Admission: RE | Admit: 2021-03-02 | Discharge: 2021-03-02 | Disposition: A | Discharge status: Home / Self Care | Payer: Medicare HMO | Source: Ambulatory Visit | Attending: Orthopedic Surgery | Admitting: Orthopedic Surgery

## 2021-03-02 ENCOUNTER — Other Ambulatory Visit: Payer: Self-pay

## 2021-03-02 DIAGNOSIS — M25562 Pain in left knee: Secondary | ICD-10-CM | POA: Diagnosis not present

## 2021-03-02 DIAGNOSIS — E7800 Pure hypercholesterolemia, unspecified: Secondary | ICD-10-CM | POA: Diagnosis not present

## 2021-03-02 DIAGNOSIS — I10 Essential (primary) hypertension: Secondary | ICD-10-CM | POA: Diagnosis not present

## 2021-03-02 DIAGNOSIS — H524 Presbyopia: Secondary | ICD-10-CM | POA: Diagnosis not present

## 2021-03-02 DIAGNOSIS — S83242A Other tear of medial meniscus, current injury, left knee, initial encounter: Secondary | ICD-10-CM | POA: Diagnosis not present

## 2021-03-02 MED ORDER — IOPAMIDOL (ISOVUE-M 200) INJECTION 41%
35.0000 mL | Freq: Once | INTRAMUSCULAR | Status: AC
  Administered 2021-03-02: 35 mL via INTRA_ARTICULAR

## 2021-03-04 DIAGNOSIS — F331 Major depressive disorder, recurrent, moderate: Secondary | ICD-10-CM | POA: Diagnosis not present

## 2021-03-04 DIAGNOSIS — F410 Panic disorder [episodic paroxysmal anxiety] without agoraphobia: Secondary | ICD-10-CM | POA: Diagnosis not present

## 2021-03-31 DIAGNOSIS — F410 Panic disorder [episodic paroxysmal anxiety] without agoraphobia: Secondary | ICD-10-CM | POA: Diagnosis not present

## 2021-03-31 DIAGNOSIS — F331 Major depressive disorder, recurrent, moderate: Secondary | ICD-10-CM | POA: Diagnosis not present

## 2021-04-01 DIAGNOSIS — G8918 Other acute postprocedural pain: Secondary | ICD-10-CM | POA: Diagnosis not present

## 2021-04-01 DIAGNOSIS — M94262 Chondromalacia, left knee: Secondary | ICD-10-CM | POA: Diagnosis not present

## 2021-04-01 DIAGNOSIS — S83282A Other tear of lateral meniscus, current injury, left knee, initial encounter: Secondary | ICD-10-CM | POA: Diagnosis not present

## 2021-04-01 DIAGNOSIS — S83242A Other tear of medial meniscus, current injury, left knee, initial encounter: Secondary | ICD-10-CM | POA: Diagnosis not present

## 2021-04-07 DIAGNOSIS — E7800 Pure hypercholesterolemia, unspecified: Secondary | ICD-10-CM | POA: Diagnosis not present

## 2021-04-07 DIAGNOSIS — M859 Disorder of bone density and structure, unspecified: Secondary | ICD-10-CM | POA: Diagnosis not present

## 2021-04-07 DIAGNOSIS — I120 Hypertensive chronic kidney disease with stage 5 chronic kidney disease or end stage renal disease: Secondary | ICD-10-CM | POA: Diagnosis not present

## 2021-04-07 DIAGNOSIS — N1831 Chronic kidney disease, stage 3a: Secondary | ICD-10-CM | POA: Diagnosis not present

## 2021-04-07 DIAGNOSIS — E039 Hypothyroidism, unspecified: Secondary | ICD-10-CM | POA: Diagnosis not present

## 2021-04-07 DIAGNOSIS — M8589 Other specified disorders of bone density and structure, multiple sites: Secondary | ICD-10-CM | POA: Diagnosis not present

## 2021-04-09 DIAGNOSIS — S83282D Other tear of lateral meniscus, current injury, left knee, subsequent encounter: Secondary | ICD-10-CM | POA: Diagnosis not present

## 2021-04-09 DIAGNOSIS — S83242D Other tear of medial meniscus, current injury, left knee, subsequent encounter: Secondary | ICD-10-CM | POA: Diagnosis not present

## 2021-04-14 DIAGNOSIS — F331 Major depressive disorder, recurrent, moderate: Secondary | ICD-10-CM | POA: Diagnosis not present

## 2021-04-14 DIAGNOSIS — M8580 Other specified disorders of bone density and structure, unspecified site: Secondary | ICD-10-CM | POA: Diagnosis not present

## 2021-04-14 DIAGNOSIS — M797 Fibromyalgia: Secondary | ICD-10-CM | POA: Diagnosis not present

## 2021-04-14 DIAGNOSIS — N1831 Chronic kidney disease, stage 3a: Secondary | ICD-10-CM | POA: Diagnosis not present

## 2021-04-14 DIAGNOSIS — I120 Hypertensive chronic kidney disease with stage 5 chronic kidney disease or end stage renal disease: Secondary | ICD-10-CM | POA: Diagnosis not present

## 2021-04-14 DIAGNOSIS — E063 Autoimmune thyroiditis: Secondary | ICD-10-CM | POA: Diagnosis not present

## 2021-04-14 DIAGNOSIS — E039 Hypothyroidism, unspecified: Secondary | ICD-10-CM | POA: Diagnosis not present

## 2021-04-14 DIAGNOSIS — Z0000 Encounter for general adult medical examination without abnormal findings: Secondary | ICD-10-CM | POA: Diagnosis not present

## 2021-04-14 DIAGNOSIS — K589 Irritable bowel syndrome without diarrhea: Secondary | ICD-10-CM | POA: Diagnosis not present

## 2021-04-15 DIAGNOSIS — R82998 Other abnormal findings in urine: Secondary | ICD-10-CM | POA: Diagnosis not present

## 2021-04-15 DIAGNOSIS — Z1212 Encounter for screening for malignant neoplasm of rectum: Secondary | ICD-10-CM | POA: Diagnosis not present

## 2021-04-15 DIAGNOSIS — I120 Hypertensive chronic kidney disease with stage 5 chronic kidney disease or end stage renal disease: Secondary | ICD-10-CM | POA: Diagnosis not present

## 2021-04-27 DIAGNOSIS — F410 Panic disorder [episodic paroxysmal anxiety] without agoraphobia: Secondary | ICD-10-CM | POA: Diagnosis not present

## 2021-04-27 DIAGNOSIS — F331 Major depressive disorder, recurrent, moderate: Secondary | ICD-10-CM | POA: Diagnosis not present

## 2021-05-07 DIAGNOSIS — S83282D Other tear of lateral meniscus, current injury, left knee, subsequent encounter: Secondary | ICD-10-CM | POA: Diagnosis not present

## 2021-05-07 DIAGNOSIS — S83242D Other tear of medial meniscus, current injury, left knee, subsequent encounter: Secondary | ICD-10-CM | POA: Diagnosis not present

## 2021-05-12 DIAGNOSIS — F410 Panic disorder [episodic paroxysmal anxiety] without agoraphobia: Secondary | ICD-10-CM | POA: Diagnosis not present

## 2021-05-12 DIAGNOSIS — F331 Major depressive disorder, recurrent, moderate: Secondary | ICD-10-CM | POA: Diagnosis not present

## 2021-05-17 DIAGNOSIS — E7800 Pure hypercholesterolemia, unspecified: Secondary | ICD-10-CM | POA: Diagnosis not present

## 2021-05-17 DIAGNOSIS — I129 Hypertensive chronic kidney disease with stage 1 through stage 4 chronic kidney disease, or unspecified chronic kidney disease: Secondary | ICD-10-CM | POA: Diagnosis not present

## 2021-05-17 DIAGNOSIS — N1831 Chronic kidney disease, stage 3a: Secondary | ICD-10-CM | POA: Diagnosis not present

## 2021-05-17 DIAGNOSIS — E039 Hypothyroidism, unspecified: Secondary | ICD-10-CM | POA: Diagnosis not present

## 2021-05-20 DIAGNOSIS — K006 Disturbances in tooth eruption: Secondary | ICD-10-CM | POA: Diagnosis not present

## 2021-05-26 DIAGNOSIS — F331 Major depressive disorder, recurrent, moderate: Secondary | ICD-10-CM | POA: Diagnosis not present

## 2021-05-26 DIAGNOSIS — F410 Panic disorder [episodic paroxysmal anxiety] without agoraphobia: Secondary | ICD-10-CM | POA: Diagnosis not present

## 2021-06-16 ENCOUNTER — Ambulatory Visit: Payer: Medicare HMO | Admitting: Neurology

## 2021-06-17 DIAGNOSIS — N1831 Chronic kidney disease, stage 3a: Secondary | ICD-10-CM | POA: Diagnosis not present

## 2021-06-17 DIAGNOSIS — E039 Hypothyroidism, unspecified: Secondary | ICD-10-CM | POA: Diagnosis not present

## 2021-06-17 DIAGNOSIS — I129 Hypertensive chronic kidney disease with stage 1 through stage 4 chronic kidney disease, or unspecified chronic kidney disease: Secondary | ICD-10-CM | POA: Diagnosis not present

## 2021-06-17 DIAGNOSIS — E7800 Pure hypercholesterolemia, unspecified: Secondary | ICD-10-CM | POA: Diagnosis not present

## 2021-07-28 DIAGNOSIS — E039 Hypothyroidism, unspecified: Secondary | ICD-10-CM | POA: Diagnosis not present

## 2021-08-04 DIAGNOSIS — R7301 Impaired fasting glucose: Secondary | ICD-10-CM | POA: Diagnosis not present

## 2021-08-04 DIAGNOSIS — E039 Hypothyroidism, unspecified: Secondary | ICD-10-CM | POA: Diagnosis not present

## 2021-08-04 DIAGNOSIS — E049 Nontoxic goiter, unspecified: Secondary | ICD-10-CM | POA: Diagnosis not present

## 2021-08-04 DIAGNOSIS — I10 Essential (primary) hypertension: Secondary | ICD-10-CM | POA: Diagnosis not present

## 2021-08-05 ENCOUNTER — Other Ambulatory Visit: Payer: Self-pay | Admitting: Endocrinology

## 2021-08-05 DIAGNOSIS — E049 Nontoxic goiter, unspecified: Secondary | ICD-10-CM

## 2021-08-13 DIAGNOSIS — F411 Generalized anxiety disorder: Secondary | ICD-10-CM | POA: Diagnosis not present

## 2021-08-13 DIAGNOSIS — F331 Major depressive disorder, recurrent, moderate: Secondary | ICD-10-CM | POA: Diagnosis not present

## 2021-08-13 DIAGNOSIS — F410 Panic disorder [episodic paroxysmal anxiety] without agoraphobia: Secondary | ICD-10-CM | POA: Diagnosis not present

## 2021-08-13 DIAGNOSIS — M961 Postlaminectomy syndrome, not elsewhere classified: Secondary | ICD-10-CM | POA: Diagnosis not present

## 2021-08-13 DIAGNOSIS — M7918 Myalgia, other site: Secondary | ICD-10-CM | POA: Diagnosis not present

## 2021-08-13 DIAGNOSIS — M47816 Spondylosis without myelopathy or radiculopathy, lumbar region: Secondary | ICD-10-CM | POA: Diagnosis not present

## 2021-08-20 ENCOUNTER — Other Ambulatory Visit: Payer: Self-pay

## 2021-08-20 ENCOUNTER — Ambulatory Visit
Admission: RE | Admit: 2021-08-20 | Discharge: 2021-08-20 | Disposition: A | Discharge status: Home / Self Care | Payer: Medicare HMO | Source: Ambulatory Visit | Attending: Endocrinology | Admitting: Endocrinology

## 2021-08-20 DIAGNOSIS — Z902 Acquired absence of lung [part of]: Secondary | ICD-10-CM | POA: Diagnosis not present

## 2021-08-20 DIAGNOSIS — E049 Nontoxic goiter, unspecified: Secondary | ICD-10-CM

## 2021-08-20 DIAGNOSIS — E890 Postprocedural hypothyroidism: Secondary | ICD-10-CM | POA: Diagnosis not present

## 2021-08-21 ENCOUNTER — Ambulatory Visit: Payer: Medicare HMO | Admitting: Neurology

## 2021-08-27 DIAGNOSIS — T3695XA Adverse effect of unspecified systemic antibiotic, initial encounter: Secondary | ICD-10-CM | POA: Diagnosis not present

## 2021-08-27 DIAGNOSIS — H66002 Acute suppurative otitis media without spontaneous rupture of ear drum, left ear: Secondary | ICD-10-CM | POA: Diagnosis not present

## 2021-08-27 DIAGNOSIS — H6123 Impacted cerumen, bilateral: Secondary | ICD-10-CM | POA: Diagnosis not present

## 2021-08-27 DIAGNOSIS — B379 Candidiasis, unspecified: Secondary | ICD-10-CM | POA: Diagnosis not present

## 2021-09-08 DIAGNOSIS — M47816 Spondylosis without myelopathy or radiculopathy, lumbar region: Secondary | ICD-10-CM | POA: Diagnosis not present

## 2021-09-16 DIAGNOSIS — I129 Hypertensive chronic kidney disease with stage 1 through stage 4 chronic kidney disease, or unspecified chronic kidney disease: Secondary | ICD-10-CM | POA: Diagnosis not present

## 2021-09-16 DIAGNOSIS — E039 Hypothyroidism, unspecified: Secondary | ICD-10-CM | POA: Diagnosis not present

## 2021-09-16 DIAGNOSIS — E7800 Pure hypercholesterolemia, unspecified: Secondary | ICD-10-CM | POA: Diagnosis not present

## 2021-09-16 DIAGNOSIS — N1831 Chronic kidney disease, stage 3a: Secondary | ICD-10-CM | POA: Diagnosis not present

## 2021-10-05 DIAGNOSIS — E039 Hypothyroidism, unspecified: Secondary | ICD-10-CM | POA: Diagnosis not present

## 2021-10-09 ENCOUNTER — Telehealth: Payer: Self-pay

## 2021-10-09 NOTE — Telephone Encounter (Signed)
New message   Your information has been sent to [facility].  Merleen Milliner (Key: [id number]) Aimovig 70MG /ML auto-injectors   Form [employer] Electronic PA Form Created 13 hours ago Sent to Plan 10 minutes ago Plan Response 9 minutes ago Submit Clinical Questions 1 minute ago Determination Wait for Determination Please wait for [facility] NCPDP 2017 to return a determination.

## 2021-10-13 NOTE — Telephone Encounter (Signed)
F/u  [name] (Key: [id number]) Aimovig 70MG /ML auto-injectors   Form [employer] Electronic PA Form Created 4 days ago Sent to Plan 4 days ago Plan Response 4 days ago Submit Clinical Questions 4 days ago Determination Favorable 4 days ago Message from Plan PA Case: [id number], Status: Approved, Coverage Starts on: 10/18/2020 12:00:00 AM, Coverage Ends on: 10/17/2022 12:00:00 AM. Questions? Contact [phone].

## 2021-10-21 DIAGNOSIS — F411 Generalized anxiety disorder: Secondary | ICD-10-CM | POA: Diagnosis not present

## 2021-10-21 DIAGNOSIS — E6609 Other obesity due to excess calories: Secondary | ICD-10-CM | POA: Diagnosis not present

## 2021-10-21 DIAGNOSIS — Z1331 Encounter for screening for depression: Secondary | ICD-10-CM | POA: Diagnosis not present

## 2021-10-21 DIAGNOSIS — Z1339 Encounter for screening examination for other mental health and behavioral disorders: Secondary | ICD-10-CM | POA: Diagnosis not present

## 2021-10-21 DIAGNOSIS — M797 Fibromyalgia: Secondary | ICD-10-CM | POA: Diagnosis not present

## 2021-10-21 DIAGNOSIS — F331 Major depressive disorder, recurrent, moderate: Secondary | ICD-10-CM | POA: Diagnosis not present

## 2021-10-21 DIAGNOSIS — N1831 Chronic kidney disease, stage 3a: Secondary | ICD-10-CM | POA: Diagnosis not present

## 2021-10-21 DIAGNOSIS — K589 Irritable bowel syndrome without diarrhea: Secondary | ICD-10-CM | POA: Diagnosis not present

## 2021-10-21 DIAGNOSIS — I120 Hypertensive chronic kidney disease with stage 5 chronic kidney disease or end stage renal disease: Secondary | ICD-10-CM | POA: Diagnosis not present

## 2021-10-21 DIAGNOSIS — E063 Autoimmune thyroiditis: Secondary | ICD-10-CM | POA: Diagnosis not present

## 2021-10-21 DIAGNOSIS — E039 Hypothyroidism, unspecified: Secondary | ICD-10-CM | POA: Diagnosis not present

## 2021-10-26 NOTE — Progress Notes (Signed)
NEUROLOGY FOLLOW UP OFFICE NOTE  [name] [id number]  Assessment/Plan:   1.  Migraine without aura, without status migrainosus, not intractable, overall stable.   1.  Migraine prevention:  Medication not indicated 2.  Migraine rescue:  Rizatriptan 10mg  3.  Limit use of pain relievers to no more than 2 days out of week to prevent risk of rebound or medication-overuse headache. 4.  Keep headache diary 5.  Follow up 1 year   Subjective:  [name] is a 66 year old right-handed female with HTN, degenerative disc disease of lumbar spine, s/p spinal cord stimulator, and diabetes who follows up for migraines.   UPDATE: Intensity:  Moderate to severe Duration:  Usually 15-20 minutes.  Postdrome fatigue for a day Frequency:  4 a month   Current NSAIDS:  none Current analgesics:  acetaminophen, tramadol  Current triptans:  Rizatriptan 10mg  (effective but causes drowsiness) Current ergotamine:  none Current anti-emetic:  none Current muscle relaxants:  Flexeril Current anti-anxiolytic:  none Current sleep aide:  none Current Antihypertensive medications:  Lisinopril-HCTZ Current Antidepressant medications:  Pristiq, trazodone Current Anticonvulsant medications:  Gabapentin 300mg  TID Current anti-CGRP:  none Current Vitamins/Herbal/Supplements:  none Current Antihistamines/Decongestants:  none Other therapy:  none Hormone/birth control:  none  Other medications:  none   Caffeine:  1 cup decaf coffee, 1 cup decaf tea.  No soda Diet:  Drinks mostly water.  Eats two meals a day.  Sometimes may snack Exercise:  no Depression:  yes; Anxiety:  yes.  [name] husband passed away last month due to complications of interstitial pulmonary fibrosis.  She has had a difficult month. Other pain:  Chronic back pain Sleep hygiene:  Takes a couple of hours to fall asleep.  Gets up a couple of times to go to the bathroom   HISTORY:  In April 2021, she was hospitalized and diagnosed  with transient global amnesia.  She kept repeating questions and couldn't remember having shoulder surgery the prior month.  She didn't remember her activities for that day.  CT of head was unremarkable (unable to have MRI due to spinal cord stimulator).  EEG was normal.     A couple of months later, she developed vertigo.  When she would walk, she felt like her environment was moving.  If she was in bed, it felt like her bed was turning.  She was coming out of the bathroom and fell.  She did not hit her head.  If she was bent over and came back up, she would feel dizzy, lasting 4 to 6 minutes.  She was on meclizine.  This was ongoing for a couple of months.  Dizziness overall improved, but may have a moment of dizziness for a second when she got up.  She reports prior history of vertigo a couple of years ago, which she attributes to receiving both the flu shot and shingles shot on the same day. She was in the bed for a week before it resolved but nothing as severe as this recent event.     She started having migraines in 1980, after the birth of her daughter.  They are severe and throbbing, either mid-frontal to top of head, back of head or temples.  Associated with nausea, photophobia, phonophobia.  It lasts 30 minutes with rizatriptan and occurs about 7 migraines in 30 days.       Past NSAIDS:  Ibuprofen, naproxen Past analgesics:  Tylenol Past abortive triptans:  Sumatriptan [name] (hyperventilating, near  syncope) Past abortive ergotamine:  none Past muscle relaxants:  none Past anti-emetic:  none Past antihypertensive medications:  none Past antidepressant medications:  amitriptyline, Cymbalta, sertraline Past anticonvulsant medications:  topiramate Past anti-CGRP:  Aimovig expensive Past vitamins/Herbal/Supplements:  none Past antihistamines/decongestants: meclizine Other past therapies:  none     Family history of headache:  Mother (brain aneurysm in 1965), brother (migraines), daughter  (migraines), granddaughter (migraines)    PAST MEDICAL HISTORY: Past Medical History:  Diagnosis Date   Arthritis    DDD (degenerative disc disease), lumbar    Diabetes mellitus 1-13   patient denies and says never made aware;  BORDERLINE DR. BALAN-diet controlled;    GERD (gastroesophageal reflux disease)    Hepatitis    genotype 1b hepatitis C virus, monitored by [facility] infectious disease group   History of bronchitis    Hyperlipidemia    Hypertension    Spinal cord stimulator status    placed 2006   Thyroid disease    takes synthroid    MEDICATIONS: Current Outpatient Medications on File Prior to Visit  Medication Sig Dispense Refill   cyclobenzaprine (FLEXERIL) 10 MG tablet Take 10 mg by mouth every 8 (eight) hours as needed for muscle spasms.      fluticasone (FLONASE) 50 MCG/ACT nasal spray Place 2 sprays into the nose daily as needed for allergies.      gabapentin (NEURONTIN) 300 MG capsule Take 300 mg by mouth daily.     levothyroxine (SYNTHROID) 112 MCG tablet Take 112 mcg by mouth daily.     lisinopril-hydrochlorothiazide (PRINZIDE,ZESTORETIC) 20-12.5 MG per tablet Take 0.5 tablets by mouth every morning.      pantoprazole (PROTONIX) 40 MG tablet Take 40 mg by mouth daily.     pravastatin (PRAVACHOL) 20 MG tablet Take 20 mg by mouth daily.      rizatriptan (MAXALT) 10 MG tablet Take 1 tablet earliest onset of migraine.  May repeat in 2 hours if needed.  Maximum 2 tablets in 24 hours. 10 tablet 5   sertraline (ZOLOFT) 50 MG tablet Take 50 mg by mouth daily.     traMADol (ULTRAM) 50 MG tablet Take 1 tablet (50 mg total) by mouth every 6 (six) hours as needed. (Patient taking differently: Take 50 mg by mouth every 6 (six) hours as needed.) 15 tablet 0   traZODone (DESYREL) 150 MG tablet Take 150 mg by mouth at bedtime.     Wheat Dextrin (BENEFIBER DRINK MIX PO) Take 10 mLs by mouth in the morning, at noon, and at bedtime.     No current facility-administered medications on  file prior to visit.    ALLERGIES: Allergies  Allergen Reactions   Acetic Acid-Oxyquinoline Other (See Comments)    FEM PH; caused (vaginal) bleeding   Buprenorphine Hcl Itching, Nausea Only and Rash   Codeine Nausea Only   Imitrex [Sumatriptan] Hives    "get hot and hyperventilate"   Morphine Rash   Morphine And Related Itching, Nausea Only and Other (See Comments)    Pt states actually causes her pain in stomach   Pentazocine Other (See Comments)    Caused vaginal bleeding; was given to patient by OB-GYN for pain secondary to GYN issue   Pentazocine Lactate Other (See Comments)    Caused vaginal bleeding; was given to patient by OB-GYN for pain secondary to GYN issue   Sumatriptan Succinate Hives    Hyperventilation "get hot and hyperventilate"    FAMILY HISTORY: Family History  Problem Relation Age of  Onset   Hypertension Father    Cancer Father        colon, liver   Atrial fibrillation Father    Hypertension Sister    Hypertension Brother    Cancer Brother        lung, adrenal glands and brain   Hypertension Brother    Aneurysm Mother        brain   Stroke Mother 58      Objective:  Blood pressure (!) 111/58, pulse 93, height 5\' 6"  (1.676 m), weight 185 lb (83.9 kg), last menstrual period 06/24/2012, SpO2 91 %. General: No acute distress.  Patient appears well-groomed.     Metta Clines, DO  CC: Domenick Gong, MD

## 2021-10-27 ENCOUNTER — Ambulatory Visit (INDEPENDENT_AMBULATORY_CARE_PROVIDER_SITE_OTHER): Payer: Medicare HMO | Admitting: Neurology

## 2021-10-27 ENCOUNTER — Other Ambulatory Visit: Payer: Self-pay

## 2021-10-27 ENCOUNTER — Encounter: Payer: Self-pay | Admitting: Neurology

## 2021-10-27 VITALS — BP 111/58 | HR 93 | Ht 66.0 in | Wt 185.0 lb

## 2021-10-27 DIAGNOSIS — G43009 Migraine without aura, not intractable, without status migrainosus: Secondary | ICD-10-CM

## 2021-10-27 NOTE — Patient Instructions (Signed)
Use rizatriptan as directed.  Limit use of pain relievers to no more than 2 days out of week to prevent risk of rebound or medication-overuse headache. Keep headache diary Follow up one year.

## 2021-10-28 DIAGNOSIS — F331 Major depressive disorder, recurrent, moderate: Secondary | ICD-10-CM | POA: Diagnosis not present

## 2021-10-28 DIAGNOSIS — F410 Panic disorder [episodic paroxysmal anxiety] without agoraphobia: Secondary | ICD-10-CM | POA: Diagnosis not present

## 2021-10-29 ENCOUNTER — Telehealth: Payer: Self-pay | Admitting: Internal Medicine

## 2021-11-13 DIAGNOSIS — M24812 Other specific joint derangements of left shoulder, not elsewhere classified: Secondary | ICD-10-CM | POA: Diagnosis not present

## 2021-11-15 DIAGNOSIS — E7800 Pure hypercholesterolemia, unspecified: Secondary | ICD-10-CM | POA: Diagnosis not present

## 2021-11-15 DIAGNOSIS — E039 Hypothyroidism, unspecified: Secondary | ICD-10-CM | POA: Diagnosis not present

## 2021-11-15 DIAGNOSIS — I129 Hypertensive chronic kidney disease with stage 1 through stage 4 chronic kidney disease, or unspecified chronic kidney disease: Secondary | ICD-10-CM | POA: Diagnosis not present

## 2021-11-15 DIAGNOSIS — N1831 Chronic kidney disease, stage 3a: Secondary | ICD-10-CM | POA: Diagnosis not present

## 2021-12-07 DIAGNOSIS — F331 Major depressive disorder, recurrent, moderate: Secondary | ICD-10-CM | POA: Diagnosis not present

## 2021-12-07 DIAGNOSIS — F410 Panic disorder [episodic paroxysmal anxiety] without agoraphobia: Secondary | ICD-10-CM | POA: Diagnosis not present

## 2021-12-10 NOTE — Telephone Encounter (Signed)
Nothing noted in message. Will close encounter.  

## 2022-01-04 DIAGNOSIS — F331 Major depressive disorder, recurrent, moderate: Secondary | ICD-10-CM | POA: Diagnosis not present

## 2022-01-04 DIAGNOSIS — F410 Panic disorder [episodic paroxysmal anxiety] without agoraphobia: Secondary | ICD-10-CM | POA: Diagnosis not present

## 2022-01-05 DIAGNOSIS — E039 Hypothyroidism, unspecified: Secondary | ICD-10-CM | POA: Diagnosis not present

## 2022-01-06 DIAGNOSIS — F331 Major depressive disorder, recurrent, moderate: Secondary | ICD-10-CM | POA: Diagnosis not present

## 2022-01-06 DIAGNOSIS — F410 Panic disorder [episodic paroxysmal anxiety] without agoraphobia: Secondary | ICD-10-CM | POA: Diagnosis not present

## 2022-01-13 DIAGNOSIS — M47816 Spondylosis without myelopathy or radiculopathy, lumbar region: Secondary | ICD-10-CM | POA: Diagnosis not present

## 2022-01-18 DIAGNOSIS — M24812 Other specific joint derangements of left shoulder, not elsewhere classified: Secondary | ICD-10-CM | POA: Diagnosis not present

## 2022-01-21 DIAGNOSIS — J302 Other seasonal allergic rhinitis: Secondary | ICD-10-CM | POA: Diagnosis not present

## 2022-01-21 DIAGNOSIS — J029 Acute pharyngitis, unspecified: Secondary | ICD-10-CM | POA: Diagnosis not present

## 2022-01-28 DIAGNOSIS — F331 Major depressive disorder, recurrent, moderate: Secondary | ICD-10-CM | POA: Diagnosis not present

## 2022-01-28 DIAGNOSIS — F410 Panic disorder [episodic paroxysmal anxiety] without agoraphobia: Secondary | ICD-10-CM | POA: Diagnosis not present

## 2022-02-04 ENCOUNTER — Other Ambulatory Visit: Payer: Self-pay | Admitting: Nurse Practitioner

## 2022-02-04 DIAGNOSIS — Z1231 Encounter for screening mammogram for malignant neoplasm of breast: Secondary | ICD-10-CM

## 2022-02-05 DIAGNOSIS — F331 Major depressive disorder, recurrent, moderate: Secondary | ICD-10-CM | POA: Diagnosis not present

## 2022-02-05 DIAGNOSIS — F410 Panic disorder [episodic paroxysmal anxiety] without agoraphobia: Secondary | ICD-10-CM | POA: Diagnosis not present

## 2022-02-10 DIAGNOSIS — Z1152 Encounter for screening for COVID-19: Secondary | ICD-10-CM | POA: Diagnosis not present

## 2022-02-10 DIAGNOSIS — R0981 Nasal congestion: Secondary | ICD-10-CM | POA: Diagnosis not present

## 2022-02-10 DIAGNOSIS — J029 Acute pharyngitis, unspecified: Secondary | ICD-10-CM | POA: Diagnosis not present

## 2022-02-10 DIAGNOSIS — J302 Other seasonal allergic rhinitis: Secondary | ICD-10-CM | POA: Diagnosis not present

## 2022-02-10 DIAGNOSIS — R5383 Other fatigue: Secondary | ICD-10-CM | POA: Diagnosis not present

## 2022-02-10 DIAGNOSIS — J069 Acute upper respiratory infection, unspecified: Secondary | ICD-10-CM | POA: Diagnosis not present

## 2022-02-11 ENCOUNTER — Other Ambulatory Visit (HOSPITAL_COMMUNITY)
Admission: RE | Admit: 2022-02-11 | Discharge: 2022-02-11 | Disposition: A | Discharge status: Home / Self Care | Payer: Medicare HMO | Source: Ambulatory Visit | Attending: Nurse Practitioner | Admitting: Nurse Practitioner

## 2022-02-11 ENCOUNTER — Ambulatory Visit (INDEPENDENT_AMBULATORY_CARE_PROVIDER_SITE_OTHER): Payer: Medicare HMO | Admitting: Nurse Practitioner

## 2022-02-11 ENCOUNTER — Encounter: Payer: Self-pay | Admitting: Nurse Practitioner

## 2022-02-11 VITALS — BP 124/82 | Ht 66.0 in | Wt 181.0 lb

## 2022-02-11 DIAGNOSIS — M8580 Other specified disorders of bone density and structure, unspecified site: Secondary | ICD-10-CM

## 2022-02-11 DIAGNOSIS — Z780 Asymptomatic menopausal state: Secondary | ICD-10-CM

## 2022-02-11 DIAGNOSIS — Z01419 Encounter for gynecological examination (general) (routine) without abnormal findings: Secondary | ICD-10-CM | POA: Insufficient documentation

## 2022-02-11 DIAGNOSIS — Z1151 Encounter for screening for human papillomavirus (HPV): Secondary | ICD-10-CM | POA: Diagnosis not present

## 2022-02-11 NOTE — Progress Notes (Signed)
? ?  [name] 12-10-1955 [id number] ? ? ?History:  66 y.o. G1P0001 presents for breast and pelvic exam. No GYN complaints. Postmenopausal - no HRT, no bleeding. Normal pap and mammogram history. Hypothyroidism managed by endocrinology. Mild osteopenia managed by PCP. Depression managed well with Lamotrigine, also seeing therapist. Husband passed away in 2023/10/10 from lung disease.  ? ?Gynecologic History ?Patient's last menstrual period was 06/24/2012. ?  ?Contraception: post menopausal status ?Sexually active: No ? ?Health Maintenance ?Last Pap: 03/23/2017. Results were: Normal ?Last mammogram: 02/19/2021. Results were: Normal ?Last colonoscopy: 2020. Results were: 11 polyps, 3-year recall ?Last Dexa: 2014. Results were: T-score -1.1 ? ?Past medical history, past surgical history, family history and social history were all reviewed and documented in the EPIC chart. Widowed. Daughter is hospice nurse, 2 granddaughters ages 21 and 99. Father deceased from colon cancer.  ? ?ROS:  A ROS was performed and pertinent positives and negatives are included. ? ?Exam: ? ?Vitals:  ? 02/11/22 1349  ?BP: 124/82  ?Weight: 181 lb (82.1 kg)  ?Height: '5\' 6"'$  (1.676 m)  ? ? ?Body mass index is 29.21 kg/m?. ? ?General appearance:  Normal ?Thyroid:  Symmetrical, normal in size, without palpable masses or nodularity. ?Respiratory ? Auscultation:  Clear without wheezing or rhonchi ?Cardiovascular ? Auscultation:  Regular rate, without rubs, murmurs or gallops ? Edema/varicosities:  Not grossly evident ?Abdominal ? Soft,nontender, without masses, guarding or rebound. ? Liver/spleen:  No organomegaly noted ? Hernia:  None appreciated ? Skin ? Inspection:  Grossly normal ?  ?Breasts: Examined lying and sitting.  ? Right: Without masses, retractions, discharge or axillary adenopathy. ? ? Left: Without masses, retractions, discharge or axillary adenopathy. ?Genitourinary  ? Inguinal/mons:  Normal without inguinal adenopathy ? External genitalia:   Normal appearing vulva with no masses, tenderness, or lesions ? BUS/Urethra/Skene's glands:  Normal ? Vagina:  Normal appearing with normal color and discharge, no lesions ? Cervix:  Normal appearing without discharge or lesions ? Uterus:  Normal in size, shape and contour.  Midline and mobile, nontender ? Adnexa/parametria:   ?  Rt: Normal in size, without masses or tenderness. ?  Lt: Normal in size, without masses or tenderness. ? Anus and perineum: Normal ? Digital rectal exam: Normal sphincter tone without palpated masses or tenderness ? ?Patient informed chaperone available to be present for breast and pelvic exam. Patient has requested no chaperone to be present. Patient has been advised what will be completed during breast and pelvic exam.  ? ?Assessment/Plan:  66 y.o. G1P0001 for breast and pelvic exam.  ? ?Well female exam with routine gynecological exam - Plan: Cytology - PAP( [facility]). Education provided on SBEs, importance of preventative screenings, current guidelines, high calcium diet, regular exercise, and multivitamin daily. Labs with PCP. ? ?Postmenopausal - no HRT, no bleeding ? ?Osteopenia of spine - 2014 T-score -1.1 at spine. PCP managing this. Needs DXA and will schedule with PCP's office. Taking daily Vitamin D supplement.  ? ?Screening for cervical cancer - Normal Pap history.  Pap with HR HPV today.  ? ?Screening for breast cancer - Normal mammogram history.  Continue annual screenings.  Normal breast exam today. ? ?Screening for colon cancer - 2020 colonoscopy - 11 polyps with 3-year recall recommended. Due this 10-May-2023. Father deceased from colon cancer.  ? ?Return in 1 year for annual.  ? ? ? ?Tamela Gammon DNP, 2:19 PM 02/11/2022 ?

## 2022-02-15 LAB — CYTOLOGY - PAP
Comment: NEGATIVE
Diagnosis: NEGATIVE
High risk HPV: NEGATIVE

## 2022-02-18 DIAGNOSIS — G8918 Other acute postprocedural pain: Secondary | ICD-10-CM | POA: Diagnosis not present

## 2022-02-18 DIAGNOSIS — M19012 Primary osteoarthritis, left shoulder: Secondary | ICD-10-CM | POA: Diagnosis not present

## 2022-02-18 DIAGNOSIS — M94262 Chondromalacia, left knee: Secondary | ICD-10-CM | POA: Diagnosis not present

## 2022-02-18 DIAGNOSIS — M7542 Impingement syndrome of left shoulder: Secondary | ICD-10-CM | POA: Diagnosis not present

## 2022-02-18 DIAGNOSIS — M7552 Bursitis of left shoulder: Secondary | ICD-10-CM | POA: Diagnosis not present

## 2022-02-26 DIAGNOSIS — M19012 Primary osteoarthritis, left shoulder: Secondary | ICD-10-CM | POA: Diagnosis not present

## 2022-02-26 DIAGNOSIS — Z9889 Other specified postprocedural states: Secondary | ICD-10-CM | POA: Diagnosis not present

## 2022-03-09 ENCOUNTER — Ambulatory Visit: Payer: Medicare HMO

## 2022-03-10 DIAGNOSIS — H524 Presbyopia: Secondary | ICD-10-CM | POA: Diagnosis not present

## 2022-03-12 DIAGNOSIS — Z800 Family history of malignant neoplasm of digestive organs: Secondary | ICD-10-CM | POA: Diagnosis not present

## 2022-03-12 DIAGNOSIS — Z86010 Personal history of colonic polyps: Secondary | ICD-10-CM | POA: Diagnosis not present

## 2022-03-12 DIAGNOSIS — K5904 Chronic idiopathic constipation: Secondary | ICD-10-CM | POA: Diagnosis not present

## 2022-03-17 ENCOUNTER — Ambulatory Visit
Admission: RE | Admit: 2022-03-17 | Discharge: 2022-03-17 | Disposition: A | Discharge status: Home / Self Care | Payer: Medicare HMO | Source: Ambulatory Visit | Attending: Nurse Practitioner | Admitting: Nurse Practitioner

## 2022-03-17 DIAGNOSIS — Z1231 Encounter for screening mammogram for malignant neoplasm of breast: Secondary | ICD-10-CM | POA: Diagnosis not present

## 2022-04-14 DIAGNOSIS — L821 Other seborrheic keratosis: Secondary | ICD-10-CM | POA: Diagnosis not present

## 2022-04-14 DIAGNOSIS — L820 Inflamed seborrheic keratosis: Secondary | ICD-10-CM | POA: Diagnosis not present

## 2022-04-21 DIAGNOSIS — E039 Hypothyroidism, unspecified: Secondary | ICD-10-CM | POA: Diagnosis not present

## 2022-04-21 DIAGNOSIS — N1831 Chronic kidney disease, stage 3a: Secondary | ICD-10-CM | POA: Diagnosis not present

## 2022-04-21 DIAGNOSIS — E7800 Pure hypercholesterolemia, unspecified: Secondary | ICD-10-CM | POA: Diagnosis not present

## 2022-04-21 DIAGNOSIS — M8580 Other specified disorders of bone density and structure, unspecified site: Secondary | ICD-10-CM | POA: Diagnosis not present

## 2022-04-27 DIAGNOSIS — M8580 Other specified disorders of bone density and structure, unspecified site: Secondary | ICD-10-CM | POA: Diagnosis not present

## 2022-04-27 DIAGNOSIS — J302 Other seasonal allergic rhinitis: Secondary | ICD-10-CM | POA: Diagnosis not present

## 2022-04-27 DIAGNOSIS — E039 Hypothyroidism, unspecified: Secondary | ICD-10-CM | POA: Diagnosis not present

## 2022-04-27 DIAGNOSIS — Z1389 Encounter for screening for other disorder: Secondary | ICD-10-CM | POA: Diagnosis not present

## 2022-04-27 DIAGNOSIS — R82998 Other abnormal findings in urine: Secondary | ICD-10-CM | POA: Diagnosis not present

## 2022-04-27 DIAGNOSIS — I120 Hypertensive chronic kidney disease with stage 5 chronic kidney disease or end stage renal disease: Secondary | ICD-10-CM | POA: Diagnosis not present

## 2022-04-27 DIAGNOSIS — Z0000 Encounter for general adult medical examination without abnormal findings: Secondary | ICD-10-CM | POA: Diagnosis not present

## 2022-04-27 DIAGNOSIS — N1831 Chronic kidney disease, stage 3a: Secondary | ICD-10-CM | POA: Diagnosis not present

## 2022-04-27 DIAGNOSIS — E6609 Other obesity due to excess calories: Secondary | ICD-10-CM | POA: Diagnosis not present

## 2022-04-27 DIAGNOSIS — F411 Generalized anxiety disorder: Secondary | ICD-10-CM | POA: Diagnosis not present

## 2022-04-27 DIAGNOSIS — Z1331 Encounter for screening for depression: Secondary | ICD-10-CM | POA: Diagnosis not present

## 2022-04-27 DIAGNOSIS — F331 Major depressive disorder, recurrent, moderate: Secondary | ICD-10-CM | POA: Diagnosis not present

## 2022-06-03 DIAGNOSIS — Z9689 Presence of other specified functional implants: Secondary | ICD-10-CM | POA: Diagnosis not present

## 2022-06-03 DIAGNOSIS — M961 Postlaminectomy syndrome, not elsewhere classified: Secondary | ICD-10-CM | POA: Diagnosis not present

## 2022-06-03 DIAGNOSIS — M47816 Spondylosis without myelopathy or radiculopathy, lumbar region: Secondary | ICD-10-CM | POA: Diagnosis not present

## 2022-06-03 DIAGNOSIS — Z6829 Body mass index (BMI) 29.0-29.9, adult: Secondary | ICD-10-CM | POA: Diagnosis not present

## 2022-06-08 DIAGNOSIS — F331 Major depressive disorder, recurrent, moderate: Secondary | ICD-10-CM | POA: Diagnosis not present

## 2022-06-08 DIAGNOSIS — F410 Panic disorder [episodic paroxysmal anxiety] without agoraphobia: Secondary | ICD-10-CM | POA: Diagnosis not present

## 2022-06-18 ENCOUNTER — Telehealth: Payer: Self-pay | Admitting: *Deleted

## 2022-06-18 DIAGNOSIS — R7301 Impaired fasting glucose: Secondary | ICD-10-CM | POA: Diagnosis not present

## 2022-06-18 DIAGNOSIS — E039 Hypothyroidism, unspecified: Secondary | ICD-10-CM | POA: Diagnosis not present

## 2022-06-18 MED ORDER — FLUCONAZOLE 150 MG PO TABS: ORAL_TABLET | ORAL | 0 refills | Status: DC

## 2022-06-18 NOTE — Telephone Encounter (Signed)
Patient called and left message questioning if she has UTI. I left message for patient to call.

## 2022-06-18 NOTE — Telephone Encounter (Signed)
Dr.Lavoie replied "Can send Fluconazole 150 mg 1 tab every other day x 3.  If no improvement, urgent care for U/A and U. Culture. "   Patient informed, Rx sent.

## 2022-06-18 NOTE — Telephone Encounter (Signed)
[name] patient)  Called c/o vaginal burning all the time x 1 week. Reports she was treated for UTI 1 month ago with PCP. Patient reports the burning is constant, also reports itching no odor, no discharge, no back pain, no fever. Patient would like relief before the weekend.   Please advise

## 2022-06-22 DIAGNOSIS — K5730 Diverticulosis of large intestine without perforation or abscess without bleeding: Secondary | ICD-10-CM | POA: Diagnosis not present

## 2022-06-22 DIAGNOSIS — D124 Benign neoplasm of descending colon: Secondary | ICD-10-CM | POA: Diagnosis not present

## 2022-06-22 DIAGNOSIS — D125 Benign neoplasm of sigmoid colon: Secondary | ICD-10-CM | POA: Diagnosis not present

## 2022-06-22 DIAGNOSIS — D123 Benign neoplasm of transverse colon: Secondary | ICD-10-CM | POA: Diagnosis not present

## 2022-06-22 DIAGNOSIS — Z86010 Personal history of colonic polyps: Secondary | ICD-10-CM | POA: Diagnosis not present

## 2022-06-22 DIAGNOSIS — Z09 Encounter for follow-up examination after completed treatment for conditions other than malignant neoplasm: Secondary | ICD-10-CM | POA: Diagnosis not present

## 2022-06-22 DIAGNOSIS — K648 Other hemorrhoids: Secondary | ICD-10-CM | POA: Diagnosis not present

## 2022-06-22 LAB — HM COLONOSCOPY

## 2022-06-24 DIAGNOSIS — D125 Benign neoplasm of sigmoid colon: Secondary | ICD-10-CM | POA: Diagnosis not present

## 2022-06-28 DIAGNOSIS — M47816 Spondylosis without myelopathy or radiculopathy, lumbar region: Secondary | ICD-10-CM | POA: Diagnosis not present

## 2022-07-01 DIAGNOSIS — R7301 Impaired fasting glucose: Secondary | ICD-10-CM | POA: Diagnosis not present

## 2022-07-01 DIAGNOSIS — I10 Essential (primary) hypertension: Secondary | ICD-10-CM | POA: Diagnosis not present

## 2022-07-01 DIAGNOSIS — E049 Nontoxic goiter, unspecified: Secondary | ICD-10-CM | POA: Diagnosis not present

## 2022-07-01 DIAGNOSIS — E039 Hypothyroidism, unspecified: Secondary | ICD-10-CM | POA: Diagnosis not present

## 2022-07-08 DIAGNOSIS — F410 Panic disorder [episodic paroxysmal anxiety] without agoraphobia: Secondary | ICD-10-CM | POA: Diagnosis not present

## 2022-07-08 DIAGNOSIS — F331 Major depressive disorder, recurrent, moderate: Secondary | ICD-10-CM | POA: Diagnosis not present

## 2022-08-02 DIAGNOSIS — M7061 Trochanteric bursitis, right hip: Secondary | ICD-10-CM | POA: Diagnosis not present

## 2022-08-02 DIAGNOSIS — M5451 Vertebrogenic low back pain: Secondary | ICD-10-CM | POA: Diagnosis not present

## 2022-08-09 DIAGNOSIS — F331 Major depressive disorder, recurrent, moderate: Secondary | ICD-10-CM | POA: Diagnosis not present

## 2022-08-09 DIAGNOSIS — M961 Postlaminectomy syndrome, not elsewhere classified: Secondary | ICD-10-CM | POA: Diagnosis not present

## 2022-08-09 DIAGNOSIS — Z9689 Presence of other specified functional implants: Secondary | ICD-10-CM | POA: Diagnosis not present

## 2022-08-09 DIAGNOSIS — M47816 Spondylosis without myelopathy or radiculopathy, lumbar region: Secondary | ICD-10-CM | POA: Diagnosis not present

## 2022-08-09 DIAGNOSIS — F410 Panic disorder [episodic paroxysmal anxiety] without agoraphobia: Secondary | ICD-10-CM | POA: Diagnosis not present

## 2022-08-10 ENCOUNTER — Other Ambulatory Visit: Payer: Self-pay | Admitting: Physician Assistant

## 2022-08-10 DIAGNOSIS — M961 Postlaminectomy syndrome, not elsewhere classified: Secondary | ICD-10-CM

## 2022-08-13 DIAGNOSIS — E039 Hypothyroidism, unspecified: Secondary | ICD-10-CM | POA: Diagnosis not present

## 2022-08-18 ENCOUNTER — Ambulatory Visit
Admission: RE | Admit: 2022-08-18 | Discharge: 2022-08-18 | Disposition: A | Discharge status: Home / Self Care | Payer: Medicare HMO | Source: Ambulatory Visit | Attending: Physician Assistant | Admitting: Physician Assistant

## 2022-08-18 DIAGNOSIS — M4326 Fusion of spine, lumbar region: Secondary | ICD-10-CM | POA: Diagnosis not present

## 2022-08-18 DIAGNOSIS — M4316 Spondylolisthesis, lumbar region: Secondary | ICD-10-CM | POA: Diagnosis not present

## 2022-08-18 DIAGNOSIS — M5126 Other intervertebral disc displacement, lumbar region: Secondary | ICD-10-CM | POA: Diagnosis not present

## 2022-08-18 DIAGNOSIS — M961 Postlaminectomy syndrome, not elsewhere classified: Secondary | ICD-10-CM

## 2022-08-18 MED ORDER — ONDANSETRON HCL 4 MG/2ML IJ SOLN
4.0000 mg | Freq: Once | INTRAMUSCULAR | Status: AC | PRN
  Administered 2022-08-18: 4 mg via INTRAMUSCULAR

## 2022-08-18 MED ORDER — MEPERIDINE HCL 50 MG/ML IJ SOLN
50.0000 mg | Freq: Once | INTRAMUSCULAR | Status: AC | PRN
  Administered 2022-08-18: 50 mg via INTRAMUSCULAR

## 2022-08-18 MED ORDER — DIAZEPAM 5 MG PO TABS
5.0000 mg | ORAL_TABLET | Freq: Once | ORAL | Status: AC
  Administered 2022-08-18: 5 mg via ORAL

## 2022-08-18 MED ORDER — IOPAMIDOL (ISOVUE-M 200) INJECTION 41%
15.0000 mL | Freq: Once | INTRAMUSCULAR | Status: AC
  Administered 2022-08-18: 15 mL via INTRATHECAL

## 2022-08-18 NOTE — Progress Notes (Signed)
Pt reports her spinal cord stimulator has been turned off for myelogram procedure

## 2022-08-18 NOTE — Discharge Instructions (Signed)

## 2022-08-18 NOTE — Discharge Instr - Other Info (Addendum)
1104: pt reports pain 10/10 from myelogram procedure in lower back. See MAR 11:23 pain 6/10, improvement noted

## 2022-08-24 ENCOUNTER — Other Ambulatory Visit: Payer: Self-pay | Admitting: Physician Assistant

## 2022-08-24 DIAGNOSIS — M961 Postlaminectomy syndrome, not elsewhere classified: Secondary | ICD-10-CM

## 2022-08-25 ENCOUNTER — Ambulatory Visit (INDEPENDENT_AMBULATORY_CARE_PROVIDER_SITE_OTHER): Payer: Medicare HMO | Admitting: Internal Medicine

## 2022-08-25 ENCOUNTER — Encounter: Payer: Self-pay | Admitting: Internal Medicine

## 2022-08-25 VITALS — BP 118/84 | HR 76 | Temp 97.9°F | Ht 66.0 in | Wt 173.0 lb

## 2022-08-25 DIAGNOSIS — E782 Mixed hyperlipidemia: Secondary | ICD-10-CM | POA: Diagnosis not present

## 2022-08-25 DIAGNOSIS — I10 Essential (primary) hypertension: Secondary | ICD-10-CM

## 2022-08-25 DIAGNOSIS — K219 Gastro-esophageal reflux disease without esophagitis: Secondary | ICD-10-CM

## 2022-08-25 DIAGNOSIS — G4700 Insomnia, unspecified: Secondary | ICD-10-CM | POA: Diagnosis not present

## 2022-08-25 DIAGNOSIS — E890 Postprocedural hypothyroidism: Secondary | ICD-10-CM

## 2022-08-25 DIAGNOSIS — F341 Dysthymic disorder: Secondary | ICD-10-CM

## 2022-08-25 DIAGNOSIS — M961 Postlaminectomy syndrome, not elsewhere classified: Secondary | ICD-10-CM | POA: Diagnosis not present

## 2022-08-25 NOTE — Patient Instructions (Signed)
We will get you in with the back specialist.

## 2022-08-25 NOTE — Progress Notes (Signed)
   Subjective:   Patient ID: [name], female    DOB: 1956-08-03, 66 y.o.   MRN: [id number]  HPI The patient is a 66 YO new patient coming in for ongoing care.   PMH, [facility], social history reviewed and updated  Review of Systems  Constitutional: Negative.   HENT: Negative.    Eyes: Negative.   Respiratory:  Negative for cough, chest tightness and shortness of breath.   Cardiovascular:  Negative for chest pain, palpitations and leg swelling.  Gastrointestinal:  Negative for abdominal distention, abdominal pain, constipation, diarrhea, nausea and vomiting.  Musculoskeletal:  Positive for arthralgias, back pain and myalgias.  Skin: Negative.   Neurological: Negative.   Psychiatric/Behavioral: Negative.      Objective:  Physical Exam Constitutional:      Appearance: She is well-developed.  HENT:     Head: Normocephalic and atraumatic.  Cardiovascular:     Rate and Rhythm: Normal rate and regular rhythm.  Pulmonary:     Effort: Pulmonary effort is normal. No respiratory distress.     Breath sounds: Normal breath sounds. No wheezing or rales.  Abdominal:     General: Bowel sounds are normal. There is no distension.     Palpations: Abdomen is soft.     Tenderness: There is no abdominal tenderness. There is no rebound.  Musculoskeletal:        General: Tenderness present.     Cervical back: Normal range of motion.  Skin:    General: Skin is warm and dry.  Neurological:     Mental Status: She is alert and oriented to person, place, and time.     Coordination: Coordination abnormal.     Vitals:   08/25/22 1100 08/25/22 1131  BP: (!) 140/80 118/84  Pulse: 76   Temp: 97.9 F (36.6 C)   TempSrc: Oral   SpO2: 96%   Weight: 173 lb (78.5 kg)   Height: '5\' 6"'$  (1.676 m)     Assessment & Plan:

## 2022-08-27 ENCOUNTER — Telehealth: Payer: Self-pay | Admitting: Internal Medicine

## 2022-08-27 DIAGNOSIS — E785 Hyperlipidemia, unspecified: Secondary | ICD-10-CM | POA: Insufficient documentation

## 2022-08-27 DIAGNOSIS — K219 Gastro-esophageal reflux disease without esophagitis: Secondary | ICD-10-CM | POA: Insufficient documentation

## 2022-08-27 NOTE — Assessment & Plan Note (Signed)
She wishes second opinion to see options for her back. She had recent CT myelogram.

## 2022-08-27 NOTE — Assessment & Plan Note (Signed)
Taking synthroid 112 mcg daily and seeing Balan for monitoring.

## 2022-08-27 NOTE — Assessment & Plan Note (Signed)
Taking pravastatin 20 mg daily and can refill as needed.

## 2022-08-27 NOTE — Assessment & Plan Note (Signed)
Taking lamictal 200 mg daily and lexapro 20 mg daily and controlled now.

## 2022-08-27 NOTE — Telephone Encounter (Signed)
Caller & Relationship to patient: PT  Call back number: [phone]  Date of last office visit: 08/25/2022     Date of next office visit: N/A  Medication(s) to be refilled:  traZODone (DESYREL) 150 MG tablet       Preferred Pharmacy:  [facility] (This has not been listed yet as one of the PT's preferred pharmacies)

## 2022-08-27 NOTE — Assessment & Plan Note (Signed)
Taking protonix 40 mg daily and okay with refilling when due. Controlled and stable symptoms.

## 2022-08-27 NOTE — Assessment & Plan Note (Signed)
BP at goal on lisinopril/hctz 20/12.5 mg daily and will continue. Can refill as needed.

## 2022-08-27 NOTE — Assessment & Plan Note (Signed)
Uses trazodone 150 mg qhs and doing okay with this.

## 2022-08-30 MED ORDER — TRAZODONE HCL 150 MG PO TABS: 150.0000 mg | ORAL_TABLET | Freq: Every day | ORAL | 3 refills | Status: DC

## 2022-08-30 NOTE — Telephone Encounter (Signed)
Sent in

## 2022-09-02 DIAGNOSIS — K5904 Chronic idiopathic constipation: Secondary | ICD-10-CM | POA: Diagnosis not present

## 2022-09-08 NOTE — Progress Notes (Signed)
[facility]

## 2022-09-13 ENCOUNTER — Ambulatory Visit
Admission: RE | Admit: 2022-09-13 | Discharge: 2022-09-13 | Disposition: A | Discharge status: Home / Self Care | Payer: Medicare HMO | Source: Ambulatory Visit | Attending: Nurse Practitioner | Admitting: Nurse Practitioner

## 2022-09-13 VITALS — BP 94/58 | HR 85 | Temp 98.6°F | Resp 18

## 2022-09-13 DIAGNOSIS — B370 Candidal stomatitis: Secondary | ICD-10-CM

## 2022-09-13 MED ORDER — NYSTATIN 100000 UNIT/ML MT SUSP: 500000.0000 [IU] | Freq: Four times a day (QID) | OROMUCOSAL | 0 refills | Status: AC

## 2022-09-13 NOTE — Discharge Instructions (Signed)
Nystatin solution as prescribed Follow-up with your PCP in 2 to 3 days for recheck Please go to the emergency room for any worsening symptoms I hope you feel better soon!

## 2022-09-13 NOTE — ED Provider Notes (Signed)
[facility]    CSN: [id number] Arrival date & time: 09/13/22  1439      History   Chief Complaint Chief Complaint  Patient presents with   Sore Throat    3:30 APPT     HPI [name] is a 66 y.o. female presents for evaluation of mouth pain.  Patient reports 2 days of a white film on her cheeks and tongue with a burning sensation to her mouth.  Denies sore throat, URI symptoms, cough, fevers, or chills.  She has not been on antibiotics recently denies any recent illness.  She was given chlorhexidine mouth rinse by her dentist but has not used this yet.  No history of thrush.  No other concerns at this time.   Sore Throat    Past Medical History:  Diagnosis Date   Arthritis    DDD (degenerative disc disease), lumbar    Diabetes mellitus 1-13   patient denies and says never made aware;  BORDERLINE DR. BALAN-diet controlled;    GERD (gastroesophageal reflux disease)    Hepatitis    genotype 1b hepatitis C virus, monitored by [facility] infectious disease group   History of bronchitis    Hyperlipidemia    Hypertension    Spinal cord stimulator status    placed 2006   Thyroid disease    takes synthroid    Patient Active Problem List   Diagnosis Date Noted   Hyperlipidemia 08/27/2022   GERD (gastroesophageal reflux disease) 08/27/2022   Snoring 09/27/2016   Other chronic pain 09/27/2016   CKD (chronic kidney disease), stage III ([place]) 09/27/2016   Failed back syndrome, lumbar 09/27/2016   Positive for microalbuminuria 09/27/2016   Essential hypertension 09/27/2016   Vulvodynia 08/13/2014   Vaginal atrophy 02/18/2014   Endometriosis    DEPRESSION/ANXIETY 01/24/2008   CONSTIPATION, CHRONIC 04/27/2007   INSOMNIA, PERSISTENT 04/06/2007   Postoperative hypothyroidism 02/07/2007   OSTEOPENIA 02/07/2007   HEADACHE 02/07/2007    Past Surgical History:  Procedure Laterality Date   Anterior cervical decompression and fusion with a reflex hyper plate  96/11/8364    Q9-U7 and C5-C6 , Dr. Jerilynn Mages. Roy   BREAST EXCISIONAL BIOPSY Left 1982   BREAST SURGERY  1983   left breast cyst removed   cervical fusion w/OASIS system infuse  05/13/2006   C4-C5 and C5-C6 and C6-7,  Dr. Maia Plan   CHOLECYSTECTOMY  ?2002   CHOLECYSTECTOMY  2002   COLONOSCOPY     ESOPHAGEAL DILATION     Dr. Amedeo Plenty   KNEE SURGERY  06/2017   LAPAROSCOPIC ENDOMETRIOSIS FULGURATION  1980's   Dr. Warnell Forester   LIVER BIOPSY  2002   LOWER BACK SURGERY  OCT 2016   LUMBAR [place]  08/13/2003   Diskectomy/fusion L5-S1 rt., Dr. Maia Plan   lumbar hemilaminectomy  01/1989   L5-S1 on right, Dr. Ardeen Jourdain   NASAL SINUS SURGERY  10/30/2007   Left upper Sinus lift,  Dr. Mingo Amber   SHOULDER ARTHROSCOPY WITH SUBACROMIAL DECOMPRESSION Right 12/06/2014   Procedure: SHOULDER ARTHROSCOPY WITH SUBACROMIAL DECOMPRESSION; EXCISION BONE SPUR;  Surgeon: Roseanne Kaufman, MD;  Location: [place];  Service: Orthopedics;  Laterality: Right;   SHOULDER SURGERY Right 12/06/14   "cleaned up bone spurs"   SHOULDER SURGERY Right FEB 2016   SPINAL CORD STIMULATOR INSERTION  12/29/2004   leads placed along spine from T8 to T10, Dr. Jerilynn Mages. Roy   Spinal cord stimulator pulse generator  01/05/2005   placed in right upper  buttocks, Dr. Jerilynn Mages. Roy   THYROID LOBECTOMY Right 02/13/2016   THYROIDECTOMY Right 02/13/2016   Procedure: RIGHT THYROID LOBECTOMY WITH FROZEN SECTION ;  Surgeon: Izora Gala, MD;  Location: [facility];  Service: ENT;  Laterality: Right;   TONSILLECTOMY      OB History     Gravida  1   Para  1   Term  0   Preterm  0   AB  0   Living  1      SAB  0   IAB  0   Ectopic  0   Multiple      Live Births               Home Medications    Prior to Admission medications   Medication Sig Start Date End Date Taking? Authorizing Provider  nystatin (MYCOSTATIN) 100000 UNIT/ML suspension Take 5 mLs (500,000 Units total) by mouth 4 (four) times daily for 7 days. Swish/gargle and spit. Keep in  mouth as long as possible 09/13/22 09/20/22 Yes Efrain Sella R, NP  cyclobenzaprine (FLEXERIL) 10 MG tablet Take 10 mg by mouth every 8 (eight) hours as needed for muscle spasms.  11/16/19   [provider]  escitalopram (LEXAPRO) 20 MG tablet Take 20 mg by mouth daily.    [provider]  fluticasone (FLONASE) 50 MCG/ACT nasal spray Place 2 sprays into the nose daily as needed for allergies.     [provider]  gabapentin (NEURONTIN) 300 MG capsule Take 300 mg by mouth daily. Patient not taking: Reported on 08/25/2022    [provider]  lamoTRIgine (LAMICTAL) 100 MG tablet Take 200 mg by mouth daily.    [provider]  levothyroxine (SYNTHROID) 112 MCG tablet Take 125 mcg by mouth 6 (six) times daily. 100 mg for 4 days a week for a month    [provider]  lisinopril-hydrochlorothiazide (PRINZIDE,ZESTORETIC) 20-12.5 MG per tablet Take 0.5 tablets by mouth every morning.     [provider]  pantoprazole (PROTONIX) 40 MG tablet Take 40 mg by mouth daily. 02/05/22   [provider]  pravastatin (PRAVACHOL) 20 MG tablet Take 20 mg by mouth daily.     [provider]  rizatriptan (MAXALT) 10 MG tablet Take 1 tablet earliest onset of migraine.  May repeat in 2 hours if needed.  Maximum 2 tablets in 24 hours. 06/09/20   Pieter Partridge, DO  traZODone (DESYREL) 150 MG tablet Take 1 tablet (150 mg total) by mouth at bedtime. 08/30/22   Hoyt Koch, MD    Family History Family History  Problem Relation Age of Onset   Hypertension Father    Cancer Father        colon, liver   Atrial fibrillation Father    Hypertension Sister    Hypertension Brother    Cancer Brother        lung, adrenal glands and brain   Hypertension Brother    Aneurysm Mother        brain   Stroke Mother 37    Social History Social History   Tobacco Use   Smoking status: Former    Types: Cigarettes    Quit date: 1992    Years since  quitting: 31.9   Smokeless tobacco: Never   Tobacco comments:    quit smoking in 1992  Vaping Use   Vaping Use: Never used  Substance Use Topics   Alcohol use: Yes    Alcohol/week:  0.0 standard drinks of alcohol    Comment: occ   Drug use: No     Allergies   Codeine, Imitrex [sumatriptan], and Morphine   Review of Systems Review of Systems  HENT:         Mouth film, burning sensation of mouth     Physical Exam Triage Vital Signs ED Triage Vitals  Enc Vitals Group     BP 09/13/22 1538 (!) 94/58     Pulse Rate 09/13/22 1538 85     Resp 09/13/22 1538 18     Temp 09/13/22 1538 98.6 F (37 C)     Temp Source 09/13/22 1538 Oral     SpO2 09/13/22 1538 95 %     Weight --      Height --      Head Circumference --      Peak Flow --      Pain Score 09/13/22 1536 5     Pain Loc --      Pain Edu? --      Excl. in [place]? --    No data found.  Updated Vital Signs BP (!) 94/58 (BP Location: Right Arm)   Pulse 85   Temp 98.6 F (37 C) (Oral)   Resp 18   LMP 06/24/2012   SpO2 95%   Visual Acuity Right Eye Distance:   Left Eye Distance:   Bilateral Distance:    Right Eye Near:   Left Eye Near:    Bilateral Near:     Physical Exam Vitals and nursing note reviewed.  Constitutional:      Appearance: She is well-developed.  HENT:     Head: Normocephalic and atraumatic.     Nose: Nose normal.     Mouth/Throat:     Mouth: Mucous membranes are moist. No injury, oral lesions or angioedema.     Tongue: No lesions.     Pharynx: Oropharynx is clear. Uvula midline. No posterior oropharyngeal erythema.     Comments: Thin, white, removable film to tongue and cheeks.  Eyes:     Pupils: Pupils are equal, round, and reactive to light.  Cardiovascular:     Rate and Rhythm: Normal rate.  Pulmonary:     Effort: Pulmonary effort is normal.  Skin:    General: Skin is warm and dry.  Neurological:     General: No focal deficit present.     Mental Status: She is alert and  oriented to person, place, and time.  Psychiatric:        Mood and Affect: Mood normal.        Behavior: Behavior normal.      UC Treatments / Results  Labs (all labs ordered are listed, but only abnormal results are displayed) Labs Reviewed - No data to display  EKG   Radiology No results found.  Procedures Procedures (including critical care time)  Medications Ordered in UC Medications - No data to display  Initial Impression / Assessment and Plan / UC Course  I have reviewed the triage vital signs and the nursing notes.  Pertinent labs & imaging results that were available during my care of the patient were reviewed by me and considered in my medical decision making (see chart for details).    Exam consistent with thrush Start nystatin  Rest and fluids Follow up with PCP in 2-3 days for re-check  ER precautions reviewed and pt verbalized understanding  Final Clinical Impressions(s) / UC Diagnoses   Final diagnoses:  Oral thrush     Discharge Instructions      Nystatin solution as prescribed Follow-up with your PCP in 2 to 3 days for recheck Please go to the emergency room for any worsening symptoms I hope you feel better soon!   ED Prescriptions     Medication Sig Dispense Auth. Provider   nystatin (MYCOSTATIN) 100000 UNIT/ML suspension Take 5 mLs (500,000 Units total) by mouth 4 (four) times daily for 7 days. Swish/gargle and spit. Keep in mouth as long as possible 60 mL Ardelle Balls, NP      PDMP not reviewed this encounter.   Ardelle Balls, NP 09/13/22 1557

## 2022-09-13 NOTE — ED Triage Notes (Signed)
Pt presents with sore throat, swollen lymph nodes, white and burning tongue x 2 days.

## 2022-09-14 DIAGNOSIS — M961 Postlaminectomy syndrome, not elsewhere classified: Secondary | ICD-10-CM | POA: Diagnosis not present

## 2022-10-04 DIAGNOSIS — F331 Major depressive disorder, recurrent, moderate: Secondary | ICD-10-CM | POA: Diagnosis not present

## 2022-10-04 DIAGNOSIS — F410 Panic disorder [episodic paroxysmal anxiety] without agoraphobia: Secondary | ICD-10-CM | POA: Diagnosis not present

## 2022-10-06 DIAGNOSIS — M5380 Other specified dorsopathies, site unspecified: Secondary | ICD-10-CM | POA: Diagnosis not present

## 2022-10-06 DIAGNOSIS — Z4542 Encounter for adjustment and management of neuropacemaker (brain) (peripheral nerve) (spinal cord): Secondary | ICD-10-CM | POA: Diagnosis not present

## 2022-10-06 DIAGNOSIS — M961 Postlaminectomy syndrome, not elsewhere classified: Secondary | ICD-10-CM | POA: Diagnosis not present

## 2022-10-14 DIAGNOSIS — E039 Hypothyroidism, unspecified: Secondary | ICD-10-CM | POA: Diagnosis not present

## 2022-10-22 DIAGNOSIS — K5904 Chronic idiopathic constipation: Secondary | ICD-10-CM | POA: Diagnosis not present

## 2022-10-26 DIAGNOSIS — M7061 Trochanteric bursitis, right hip: Secondary | ICD-10-CM | POA: Diagnosis not present

## 2022-10-28 DIAGNOSIS — M7061 Trochanteric bursitis, right hip: Secondary | ICD-10-CM | POA: Diagnosis not present

## 2022-10-29 DIAGNOSIS — Z6828 Body mass index (BMI) 28.0-28.9, adult: Secondary | ICD-10-CM | POA: Diagnosis not present

## 2022-10-29 DIAGNOSIS — M961 Postlaminectomy syndrome, not elsewhere classified: Secondary | ICD-10-CM | POA: Diagnosis not present

## 2022-10-29 NOTE — Progress Notes (Unsigned)
NEUROLOGY FOLLOW UP OFFICE NOTE  [name] [id number]  Assessment/Plan:   Migraine without aura, without status migrainosus, not intractable, overall stable.   1.  Migraine prevention:  Medication not indicated 2.  Migraine rescue:  Rizatriptan '10mg'$  3.  Limit use of pain relievers to no more than 2 days out of week to prevent risk of rebound or medication-overuse headache. 4.  Keep headache diary 5.  Follow up 1 year   Subjective:  [name] is a 67 year old right-handed female with HTN, degenerative disc disease of lumbar spine, s/p spinal cord stimulator, and diabetes who follows up for migraines.   UPDATE: Intensity:  Moderate to severe Duration:  Usually 15-20 minutes.  Postdrome fatigue for a day Frequency:  no more than 2 last year.   Current NSAIDS:  none Current analgesics:  acetaminophen, tramadol  Current triptans:  Rizatriptan '10mg'$  (effective but causes drowsiness) Current ergotamine:  none Current anti-emetic:  none Current muscle relaxants:  Flexeril Current anti-anxiolytic:  none Current sleep aide:  none Current Antihypertensive medications:  Lisinopril-HCTZ Current Antidepressant medications:  Pristiq, trazodone Current Anticonvulsant medications:  Gabapentin '300mg'$  TID Current anti-CGRP:  none Current Vitamins/Herbal/Supplements:  none Current Antihistamines/Decongestants:  none Other therapy:  none Hormone/birth control:  none  Other medications:  none   Caffeine:  1 cup decaf coffee, 1 cup decaf tea.  No soda Diet:  Drinks mostly water.  Eats two meals a day.  Sometimes may snack Exercise:  no Depression:  yes; Anxiety:  yes.  [name] husband passed away last month due to complications of interstitial pulmonary fibrosis.  She has had a difficult month. Other pain:  Chronic back pain Sleep hygiene:  Takes a couple of hours to fall asleep.  Gets up a couple of times to go to the bathroom   HISTORY:  In April 2021, she was hospitalized  and diagnosed with transient global amnesia.  She kept repeating questions and couldn't remember having shoulder surgery the prior month.  She didn't remember her activities for that day.  CT of head was unremarkable (unable to have MRI due to spinal cord stimulator).  EEG was normal.     A couple of months later, she developed vertigo.  When she would walk, she felt like her environment was moving.  If she was in bed, it felt like her bed was turning.  She was coming out of the bathroom and fell.  She did not hit her head.  If she was bent over and came back up, she would feel dizzy, lasting 4 to 6 minutes.  She was on meclizine.  This was ongoing for a couple of months.  Dizziness overall improved, but may have a moment of dizziness for a second when she got up.  She reports prior history of vertigo a couple of years ago, which she attributes to receiving both the flu shot and shingles shot on the same day. She was in the bed for a week before it resolved but nothing as severe as this recent event.     She started having migraines in 1980, after the birth of her daughter.  They are severe and throbbing, either mid-frontal to top of head, back of head or temples.  Associated with nausea, photophobia, phonophobia.  It lasts 30 minutes with rizatriptan and occurs about 7 migraines in 30 days.       Past NSAIDS:  Ibuprofen, naproxen Past analgesics:  Tylenol Past abortive triptans:  Sumatriptan [place] (hyperventilating,  near syncope) Past abortive ergotamine:  none Past muscle relaxants:  none Past anti-emetic:  none Past antihypertensive medications:  none Past antidepressant medications:  amitriptyline, Cymbalta, sertraline Past anticonvulsant medications:  topiramate Past anti-CGRP:  Aimovig expensive Past vitamins/Herbal/Supplements:  none Past antihistamines/decongestants: meclizine Other past therapies:  none     Family history of headache:  Mother (brain aneurysm in 1965), brother (migraines),  daughter (migraines), granddaughter (migraines)  PAST MEDICAL HISTORY: Past Medical History:  Diagnosis Date   Arthritis    DDD (degenerative disc disease), lumbar    Diabetes mellitus 1-13   patient denies and says never made aware;  BORDERLINE DR. BALAN-diet controlled;    GERD (gastroesophageal reflux disease)    Hepatitis    genotype 1b hepatitis C virus, monitored by [facility] infectious disease group   History of bronchitis    Hyperlipidemia    Hypertension    Spinal cord stimulator status    placed 2006   Thyroid disease    takes synthroid    MEDICATIONS: Current Outpatient Medications on File Prior to Visit  Medication Sig Dispense Refill   cyclobenzaprine (FLEXERIL) 10 MG tablet Take 10 mg by mouth every 8 (eight) hours as needed for muscle spasms.      escitalopram (LEXAPRO) 20 MG tablet Take 20 mg by mouth daily.     fluticasone (FLONASE) 50 MCG/ACT nasal spray Place 2 sprays into the nose daily as needed for allergies.      gabapentin (NEURONTIN) 300 MG capsule Take 300 mg by mouth daily. (Patient not taking: Reported on 08/25/2022)     lamoTRIgine (LAMICTAL) 100 MG tablet Take 200 mg by mouth daily.     levothyroxine (SYNTHROID) 112 MCG tablet Take 125 mcg by mouth 6 (six) times daily. 100 mg for 4 days a week for a month     lisinopril-hydrochlorothiazide (PRINZIDE,ZESTORETIC) 20-12.5 MG per tablet Take 0.5 tablets by mouth every morning.      pantoprazole (PROTONIX) 40 MG tablet Take 40 mg by mouth daily.     pravastatin (PRAVACHOL) 20 MG tablet Take 20 mg by mouth daily.      rizatriptan (MAXALT) 10 MG tablet Take 1 tablet earliest onset of migraine.  May repeat in 2 hours if needed.  Maximum 2 tablets in 24 hours. 10 tablet 5   traZODone (DESYREL) 150 MG tablet Take 1 tablet (150 mg total) by mouth at bedtime. 90 tablet 3   No current facility-administered medications on file prior to visit.    ALLERGIES: Allergies  Allergen Reactions   Codeine Nausea Only    Imitrex [Sumatriptan] Hives    "get hot and hyperventilate"   Morphine Rash    FAMILY HISTORY: Family History  Problem Relation Age of Onset   Hypertension Father    Cancer Father        colon, liver   Atrial fibrillation Father    Hypertension Sister    Hypertension Brother    Cancer Brother        lung, adrenal glands and brain   Hypertension Brother    Aneurysm Mother        brain   Stroke Mother 74      Objective:  Blood pressure 117/77, pulse 69, height '5\' 6"'$  (1.676 m), weight 176 lb 12.8 oz (80.2 kg), last menstrual period 06/24/2012, SpO2 97 %. General: No acute distress.  Patient appears well-groomed.   Head:  Normocephalic/atraumatic Eyes:  Fundi examined but not visualized Neck: supple, no paraspinal tenderness, full range of motion  Heart:  Regular rate and rhythm Lungs:  Clear to auscultation bilaterally Back: No paraspinal tenderness Neurological Exam: alert and oriented to person, place, and time.  Speech fluent and not dysarthric, language intact.  CN II-XII intact. Bulk and tone normal, muscle strength 5/5 throughout.  Sensation to light touch intact.  Deep tendon reflexes 2+ throughout.  Finger to nose testing intact.  Gait normal, Romberg negative.   Metta Clines, DO  CC: Pricilla Holm, MD

## 2022-11-01 ENCOUNTER — Ambulatory Visit: Payer: Medicare HMO | Admitting: Neurology

## 2022-11-01 ENCOUNTER — Encounter: Payer: Self-pay | Admitting: Neurology

## 2022-11-01 VITALS — BP 117/77 | HR 69 | Ht 66.0 in | Wt 176.8 lb

## 2022-11-01 DIAGNOSIS — G43009 Migraine without aura, not intractable, without status migrainosus: Secondary | ICD-10-CM

## 2022-11-04 DIAGNOSIS — M7061 Trochanteric bursitis, right hip: Secondary | ICD-10-CM | POA: Diagnosis not present

## 2022-11-16 DIAGNOSIS — E039 Hypothyroidism, unspecified: Secondary | ICD-10-CM | POA: Diagnosis not present

## 2022-11-19 ENCOUNTER — Other Ambulatory Visit: Payer: Self-pay | Admitting: Orthopedic Surgery

## 2022-11-19 DIAGNOSIS — M25551 Pain in right hip: Secondary | ICD-10-CM

## 2022-11-22 ENCOUNTER — Other Ambulatory Visit: Payer: Self-pay | Admitting: *Deleted

## 2022-11-22 DIAGNOSIS — E039 Hypothyroidism, unspecified: Secondary | ICD-10-CM | POA: Diagnosis not present

## 2022-11-22 DIAGNOSIS — E049 Nontoxic goiter, unspecified: Secondary | ICD-10-CM | POA: Diagnosis not present

## 2022-11-22 DIAGNOSIS — I10 Essential (primary) hypertension: Secondary | ICD-10-CM | POA: Diagnosis not present

## 2022-11-22 DIAGNOSIS — R7301 Impaired fasting glucose: Secondary | ICD-10-CM | POA: Diagnosis not present

## 2022-11-22 LAB — HEMOGLOBIN A1C: Hemoglobin A1C: 5.5

## 2022-11-22 MED ORDER — LISINOPRIL-HYDROCHLOROTHIAZIDE 20-12.5 MG PO TABS: 0.5000 | ORAL_TABLET | ORAL | 1 refills | Status: DC

## 2022-11-22 NOTE — Telephone Encounter (Signed)
Refill for HTN med: Lisinorpil came in today - refilled = last ov was 08/2022- told to cont and rf PRN

## 2022-11-23 ENCOUNTER — Encounter: Payer: Self-pay | Admitting: Internal Medicine

## 2022-11-24 ENCOUNTER — Ambulatory Visit
Admission: RE | Admit: 2022-11-24 | Discharge: 2022-11-24 | Disposition: A | Discharge status: Home / Self Care | Payer: Medicare HMO | Source: Ambulatory Visit | Attending: Orthopedic Surgery | Admitting: Orthopedic Surgery

## 2022-11-24 DIAGNOSIS — M25551 Pain in right hip: Secondary | ICD-10-CM

## 2022-11-25 DIAGNOSIS — M25561 Pain in right knee: Secondary | ICD-10-CM | POA: Diagnosis not present

## 2022-12-01 DIAGNOSIS — M7061 Trochanteric bursitis, right hip: Secondary | ICD-10-CM | POA: Diagnosis not present

## 2022-12-07 DIAGNOSIS — M7061 Trochanteric bursitis, right hip: Secondary | ICD-10-CM | POA: Diagnosis not present

## 2022-12-09 DIAGNOSIS — M7061 Trochanteric bursitis, right hip: Secondary | ICD-10-CM | POA: Diagnosis not present

## 2022-12-14 DIAGNOSIS — M7061 Trochanteric bursitis, right hip: Secondary | ICD-10-CM | POA: Diagnosis not present

## 2022-12-16 ENCOUNTER — Other Ambulatory Visit: Payer: Medicare HMO

## 2022-12-16 DIAGNOSIS — M7061 Trochanteric bursitis, right hip: Secondary | ICD-10-CM | POA: Diagnosis not present

## 2022-12-17 ENCOUNTER — Other Ambulatory Visit: Payer: Medicare HMO

## 2022-12-21 DIAGNOSIS — M7061 Trochanteric bursitis, right hip: Secondary | ICD-10-CM | POA: Diagnosis not present

## 2022-12-23 DIAGNOSIS — M7061 Trochanteric bursitis, right hip: Secondary | ICD-10-CM | POA: Diagnosis not present

## 2022-12-28 DIAGNOSIS — M7061 Trochanteric bursitis, right hip: Secondary | ICD-10-CM | POA: Diagnosis not present

## 2022-12-30 DIAGNOSIS — M7989 Other specified soft tissue disorders: Secondary | ICD-10-CM | POA: Diagnosis not present

## 2022-12-30 DIAGNOSIS — M7061 Trochanteric bursitis, right hip: Secondary | ICD-10-CM | POA: Diagnosis not present

## 2022-12-30 DIAGNOSIS — L738 Other specified follicular disorders: Secondary | ICD-10-CM | POA: Diagnosis not present

## 2022-12-30 DIAGNOSIS — D485 Neoplasm of uncertain behavior of skin: Secondary | ICD-10-CM | POA: Diagnosis not present

## 2022-12-30 DIAGNOSIS — Z9689 Presence of other specified functional implants: Secondary | ICD-10-CM | POA: Diagnosis not present

## 2023-01-03 DIAGNOSIS — F331 Major depressive disorder, recurrent, moderate: Secondary | ICD-10-CM | POA: Diagnosis not present

## 2023-01-03 DIAGNOSIS — F410 Panic disorder [episodic paroxysmal anxiety] without agoraphobia: Secondary | ICD-10-CM | POA: Diagnosis not present

## 2023-01-06 DIAGNOSIS — M25551 Pain in right hip: Secondary | ICD-10-CM | POA: Diagnosis not present

## 2023-01-07 ENCOUNTER — Ambulatory Visit: Payer: Medicare HMO | Admitting: Internal Medicine

## 2023-01-07 ENCOUNTER — Other Ambulatory Visit: Payer: Self-pay | Admitting: Orthopedic Surgery

## 2023-01-07 DIAGNOSIS — M25551 Pain in right hip: Secondary | ICD-10-CM

## 2023-01-12 ENCOUNTER — Telehealth: Payer: Self-pay | Admitting: Internal Medicine

## 2023-01-12 NOTE — Telephone Encounter (Signed)
Prescription Request  01/12/2023  LOV: 08/25/2022  What is the name of the medication or equipment? pravastatin (PRAVACHOL) 20 MG tablet   Have you contacted your pharmacy to request a refill? Yes   Which pharmacy would you like this sent to?  [facility] [id number]  Patient notified that their request is being sent to the clinical staff for review and that they should receive a response within 2 business days.   Please advise at Mobile [phone] (mobile)

## 2023-01-13 MED ORDER — PRAVASTATIN SODIUM 20 MG PO TABS: 20.0000 mg | ORAL_TABLET | Freq: Every day | ORAL | 3 refills | Status: DC

## 2023-01-13 NOTE — Telephone Encounter (Signed)
Called patient and informed her that medication was sent to her requested pharmacy

## 2023-01-19 ENCOUNTER — Ambulatory Visit
Admission: RE | Admit: 2023-01-19 | Discharge: 2023-01-19 | Disposition: A | Discharge status: Home / Self Care | Payer: Medicare HMO | Source: Ambulatory Visit | Attending: Orthopedic Surgery | Admitting: Orthopedic Surgery

## 2023-01-19 DIAGNOSIS — M25551 Pain in right hip: Secondary | ICD-10-CM | POA: Diagnosis not present

## 2023-01-25 ENCOUNTER — Telehealth: Payer: Self-pay | Admitting: Internal Medicine

## 2023-01-25 DIAGNOSIS — M25551 Pain in right hip: Secondary | ICD-10-CM | POA: Diagnosis not present

## 2023-01-25 DIAGNOSIS — M961 Postlaminectomy syndrome, not elsewhere classified: Secondary | ICD-10-CM | POA: Diagnosis not present

## 2023-01-25 MED ORDER — PANTOPRAZOLE SODIUM 40 MG PO TBEC: 40.0000 mg | DELAYED_RELEASE_TABLET | Freq: Every day | ORAL | 0 refills | Status: DC

## 2023-01-25 NOTE — Telephone Encounter (Signed)
Sent pantoprazole.. Pravastatin already sent to [facility] 01/13/23../lm,b

## 2023-01-25 NOTE — Telephone Encounter (Signed)
Prescription Request  01/25/2023  LOV: 08/25/2022  What is the name of the medication or equipment?  pantoprazole (PROTONIX) 40 MG tablet   pravastatin (PRAVACHOL) 20 MG tablet   Have you contacted your pharmacy to request a refill? Yes   Which pharmacy would you like this sent to?  [id number] [place], [place] - [address] [place] [address] Phone: [phone] Fax: [phone]    Patient notified that their request is being sent to the clinical staff for review and that they should receive a response within 2 business days.   Please advise at [facility] [phone]

## 2023-01-29 ENCOUNTER — Other Ambulatory Visit: Payer: Medicare HMO

## 2023-01-31 ENCOUNTER — Other Ambulatory Visit: Payer: Medicare HMO

## 2023-01-31 NOTE — Telephone Encounter (Signed)
Patient called and said she would pick up the [facility] from [facility], but she would like future refills to go to [facility]. Best callback is [phone].

## 2023-02-14 ENCOUNTER — Encounter: Payer: Self-pay | Admitting: Internal Medicine

## 2023-02-14 ENCOUNTER — Ambulatory Visit (INDEPENDENT_AMBULATORY_CARE_PROVIDER_SITE_OTHER): Payer: Medicare HMO | Admitting: Internal Medicine

## 2023-02-14 VITALS — BP 140/90 | HR 55 | Temp 98.4°F | Ht 66.0 in | Wt 176.4 lb

## 2023-02-14 DIAGNOSIS — M949 Disorder of cartilage, unspecified: Secondary | ICD-10-CM | POA: Diagnosis not present

## 2023-02-14 DIAGNOSIS — K219 Gastro-esophageal reflux disease without esophagitis: Secondary | ICD-10-CM

## 2023-02-14 DIAGNOSIS — G4700 Insomnia, unspecified: Secondary | ICD-10-CM

## 2023-02-14 DIAGNOSIS — M25551 Pain in right hip: Secondary | ICD-10-CM | POA: Insufficient documentation

## 2023-02-14 DIAGNOSIS — B182 Chronic viral hepatitis C: Secondary | ICD-10-CM | POA: Diagnosis not present

## 2023-02-14 DIAGNOSIS — K5909 Other constipation: Secondary | ICD-10-CM | POA: Diagnosis not present

## 2023-02-14 DIAGNOSIS — I10 Essential (primary) hypertension: Secondary | ICD-10-CM

## 2023-02-14 DIAGNOSIS — Z0000 Encounter for general adult medical examination without abnormal findings: Secondary | ICD-10-CM | POA: Insufficient documentation

## 2023-02-14 DIAGNOSIS — E782 Mixed hyperlipidemia: Secondary | ICD-10-CM | POA: Diagnosis not present

## 2023-02-14 DIAGNOSIS — Z23 Encounter for immunization: Secondary | ICD-10-CM

## 2023-02-14 DIAGNOSIS — M899 Disorder of bone, unspecified: Secondary | ICD-10-CM | POA: Diagnosis not present

## 2023-02-14 DIAGNOSIS — N1831 Chronic kidney disease, stage 3a: Secondary | ICD-10-CM

## 2023-02-14 LAB — COMPREHENSIVE METABOLIC PANEL
ALT: 10 U/L (ref 0–35)
AST: 14 U/L (ref 0–37)
Albumin: 4 g/dL (ref 3.5–5.2)
Alkaline Phosphatase: 54 U/L (ref 39–117)
BUN: 14 mg/dL (ref 6–23)
CO2: 30 mEq/L (ref 19–32)
Calcium: 8.9 mg/dL (ref 8.4–10.5)
Chloride: 103 mEq/L (ref 96–112)
Creatinine, Ser: 1.41 mg/dL — ABNORMAL HIGH (ref 0.40–1.20)
GFR: 38.86 mL/min — ABNORMAL LOW (ref >60.00)
Glucose, Bld: 87 mg/dL (ref 70–99)
Potassium: 4 mEq/L (ref 3.5–5.1)
Sodium: 140 mEq/L (ref 135–145)
Total Bilirubin: 0.5 mg/dL (ref 0.2–1.2)
Total Protein: 6.4 g/dL (ref 6.0–8.3)

## 2023-02-14 LAB — LIPID PANEL
Cholesterol: 166 mg/dL (ref 0–200)
HDL: 61.3 mg/dL (ref >39.00)
LDL Cholesterol: 88 mg/dL (ref 0–99)
NonHDL: 104.37
Total CHOL/HDL Ratio: 3
Triglycerides: 82 mg/dL (ref 0.0–149.0)
VLDL: 16.4 mg/dL (ref 0.0–40.0)

## 2023-02-14 LAB — CBC
HCT: 38.8 % (ref 36.0–46.0)
Hemoglobin: 13 g/dL (ref 12.0–15.0)
MCHC: 33.6 g/dL (ref 30.0–36.0)
MCV: 84.6 fl (ref 78.0–100.0)
Platelets: 204 10*3/uL (ref 150.0–400.0)
RBC: 4.59 Mil/uL (ref 3.87–5.11)
RDW: 15 % (ref 11.5–15.5)
WBC: 6.7 10*3/uL (ref 4.0–10.5)

## 2023-02-14 LAB — VITAMIN D 25 HYDROXY (VIT D DEFICIENCY, FRACTURES): VITD: 37.41 ng/mL (ref 30.00–100.00)

## 2023-02-14 MED ORDER — CYCLOBENZAPRINE HCL 5 MG PO TABS: 5.0000 mg | ORAL_TABLET | Freq: Three times a day (TID) | ORAL | 3 refills | Status: DC | PRN

## 2023-02-14 MED ORDER — PANTOPRAZOLE SODIUM 40 MG PO TBEC: 40.0000 mg | DELAYED_RELEASE_TABLET | Freq: Every day | ORAL | 3 refills | Status: DC

## 2023-02-14 NOTE — Assessment & Plan Note (Signed)
Flu shot yearly. Pneumonia 20 given at visit. Shingrix complete. Tetanus due at pharmacy. Colonoscopy due 2033. Mammogram due 2025, pap smear aged out and dexa complete. Counseled about sun safety and mole surveillance. Counseled about the dangers of distracted driving. Given 10 year screening recommendations.

## 2023-02-14 NOTE — Assessment & Plan Note (Signed)
Taking trazodone 150 mg daily and doing well. Refill as needed.

## 2023-02-14 NOTE — Assessment & Plan Note (Signed)
Using otc with reasonable success currently.

## 2023-02-14 NOTE — Assessment & Plan Note (Signed)
BP mildly high today and will continue lisinopril/hctz 10/6.25. Checking CMP and adjust as needed.

## 2023-02-14 NOTE — Patient Instructions (Signed)
We have sent in the flexeril to try for pain. We will get you in for the second opinion.

## 2023-02-14 NOTE — Assessment & Plan Note (Signed)
Checking CMP and adjust as needed. Counseled about need for BP control.

## 2023-02-14 NOTE — Assessment & Plan Note (Signed)
Rx flexeril for pain and she would like second opinion for this. Referral done today.

## 2023-02-14 NOTE — Progress Notes (Signed)
   Subjective:   Patient ID: [name], female    DOB: 10/20/1955, 67 y.o.   MRN: [id number]  HPI The patient is here for physical.  PMH, [facility], social history reviewed and updated  Review of Systems  Constitutional: Negative.   HENT: Negative.    Eyes: Negative.   Respiratory:  Negative for cough, chest tightness and shortness of breath.   Cardiovascular:  Negative for chest pain, palpitations and leg swelling.  Gastrointestinal:  Negative for abdominal distention, abdominal pain, constipation, diarrhea, nausea and vomiting.  Musculoskeletal:  Positive for arthralgias and back pain.  Skin: Negative.   Neurological: Negative.   Psychiatric/Behavioral: Negative.      Objective:  Physical Exam Constitutional:      Appearance: She is well-developed. She is obese.  HENT:     Head: Normocephalic and atraumatic.  Cardiovascular:     Rate and Rhythm: Normal rate and regular rhythm.  Pulmonary:     Effort: Pulmonary effort is normal. No respiratory distress.     Breath sounds: Normal breath sounds. No wheezing or rales.  Abdominal:     General: Bowel sounds are normal. There is no distension.     Palpations: Abdomen is soft.     Tenderness: There is no abdominal tenderness. There is no rebound.  Musculoskeletal:     Cervical back: Normal range of motion.  Skin:    General: Skin is warm and dry.  Neurological:     Mental Status: She is alert and oriented to person, place, and time.     Coordination: Coordination normal.     Vitals:   02/14/23 1104 02/14/23 1158  BP: (!) 132/90 (!) 140/90  Pulse: (!) 55   Temp: 98.4 F (36.9 C)   TempSrc: Oral   SpO2: 96%   Weight: 176 lb 6.4 oz (80 kg)   Height: 5\' 6"  (1.676 m)     Assessment & Plan:  Prevnar 20 given at visit

## 2023-02-14 NOTE — Assessment & Plan Note (Signed)
Using protonix 40 mg daily and can continue.

## 2023-02-14 NOTE — Assessment & Plan Note (Signed)
Checking lipid panel and adjust as needed diet controlled currently.  

## 2023-02-15 ENCOUNTER — Telehealth: Payer: Self-pay | Admitting: Internal Medicine

## 2023-02-15 ENCOUNTER — Other Ambulatory Visit: Payer: Self-pay | Admitting: Internal Medicine

## 2023-02-15 DIAGNOSIS — N644 Mastodynia: Secondary | ICD-10-CM

## 2023-02-15 DIAGNOSIS — Z1231 Encounter for screening mammogram for malignant neoplasm of breast: Secondary | ICD-10-CM

## 2023-02-15 NOTE — Telephone Encounter (Signed)
[facility] is requesting for a diagnostic order for a breast check because pt stating that her breast is sore.  [address], [place], [place] [address] [phone]

## 2023-02-16 ENCOUNTER — Other Ambulatory Visit: Payer: Self-pay | Admitting: Internal Medicine

## 2023-02-16 DIAGNOSIS — N644 Mastodynia: Secondary | ICD-10-CM

## 2023-02-16 NOTE — Telephone Encounter (Signed)
Order entered

## 2023-03-02 ENCOUNTER — Telehealth: Payer: Self-pay | Admitting: Internal Medicine

## 2023-03-02 NOTE — Telephone Encounter (Signed)
Prescription Request  03/02/2023  LOV: 02/14/2023  What is the name of the medication or equipment? pantoprazole  Have you contacted your pharmacy to request a refill? Yes   Which pharmacy would you like this sent to?   [facility]   Patient notified that their request is being sent to the clinical staff for review and that they should receive a response within 2 business days.   Please advise at Mobile [phone] (mobile)

## 2023-03-03 ENCOUNTER — Other Ambulatory Visit: Payer: Self-pay

## 2023-03-03 MED ORDER — PANTOPRAZOLE SODIUM 40 MG PO TBEC: 40.0000 mg | DELAYED_RELEASE_TABLET | Freq: Every day | ORAL | 3 refills | Status: DC

## 2023-03-07 DIAGNOSIS — H524 Presbyopia: Secondary | ICD-10-CM | POA: Diagnosis not present

## 2023-03-20 IMAGING — CT CT KNEE*L* W/CM
1 series · 12 of 14 positions shown, 15 images · IV contrast (agent unspecified)
Comparison: None.

CLINICAL DATA: Left knee pain for the past month. No injury.
History of prior meniscus surgery in 5154.

EXAM:
CT OF THE LEFT KNEE WITH CONTRAST (CT ARTHROGRAM)
TECHNIQUE: Multidetector CT imaging was performed following the standard
protocol after administration of intra-articular contrast.
CONTRAST:  See injection documentation.

[Series 4: knee soft tissue · axial · 0.28mm/px · z∈[-240,-82]mm · 12 of 63 slices shown, 15 images]
[im 5/63  soft-tissue]
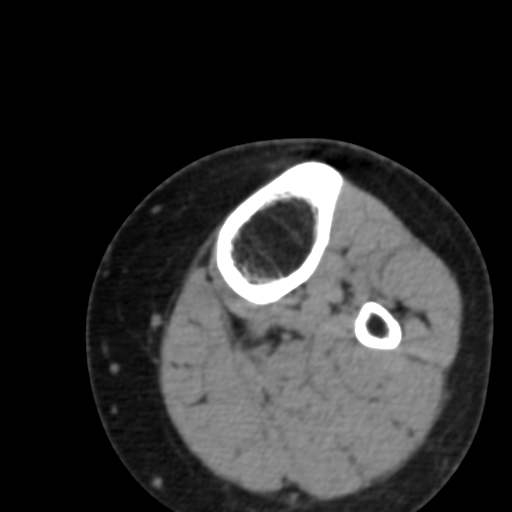
[im 5/63  bone]
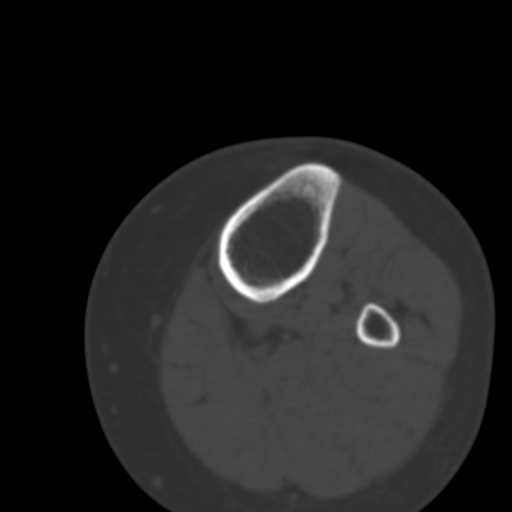
[im 10/63  bone]
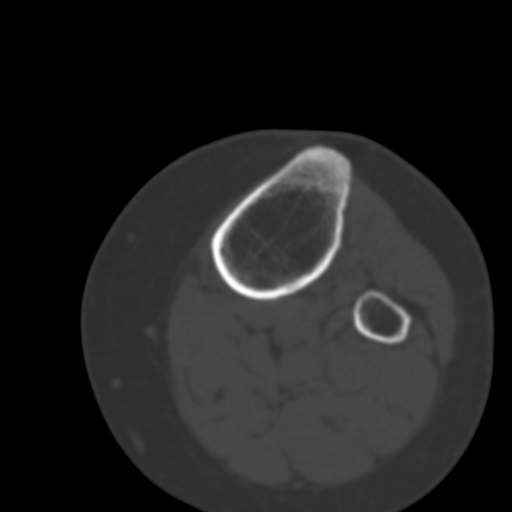
[im 15/63  bone]
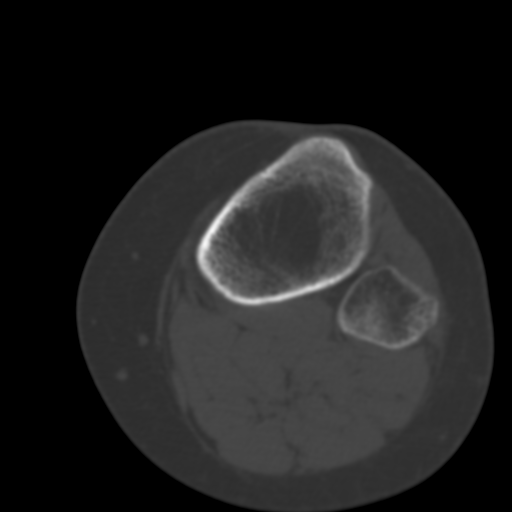
[im 20/63  bone]
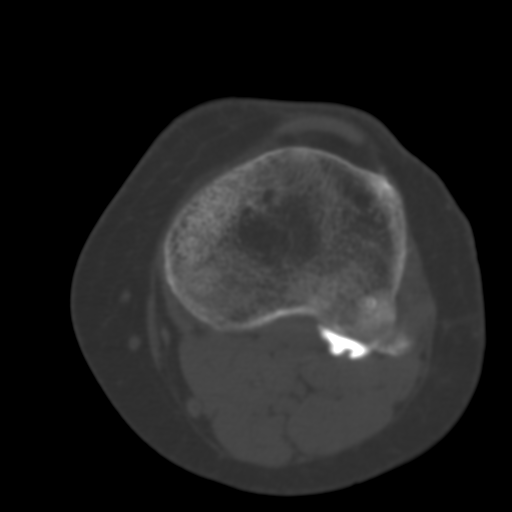
[im 24/63  soft-tissue]
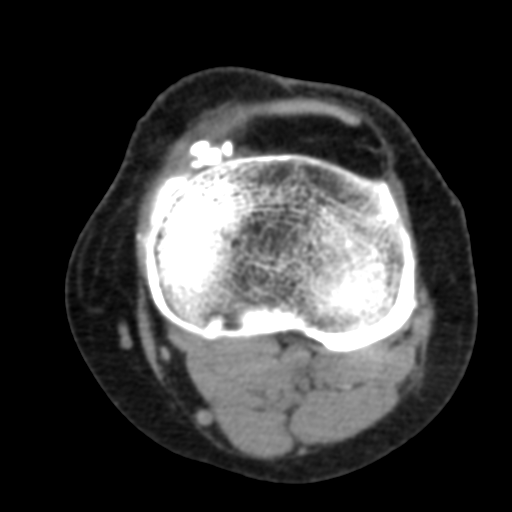
[im 24/63  bone]
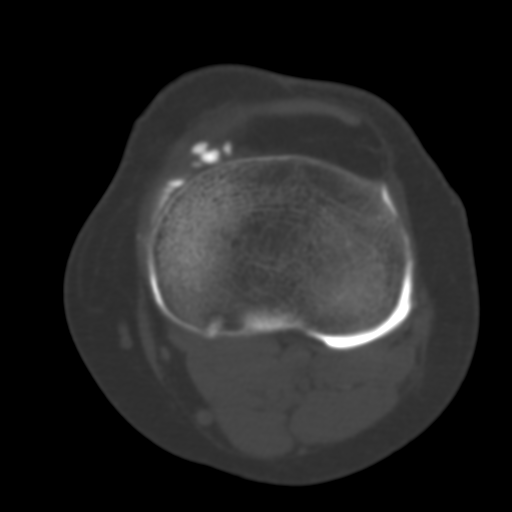
[im 29/63  bone]
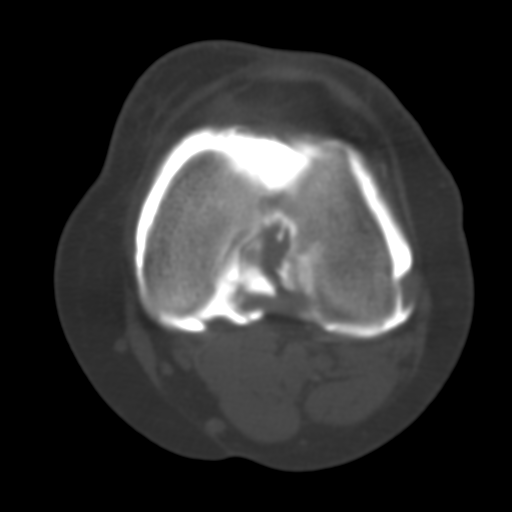
[im 34/63  bone]
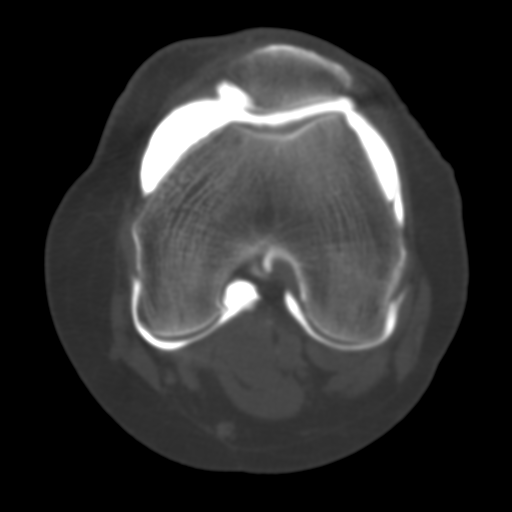
[im 39/63  bone]
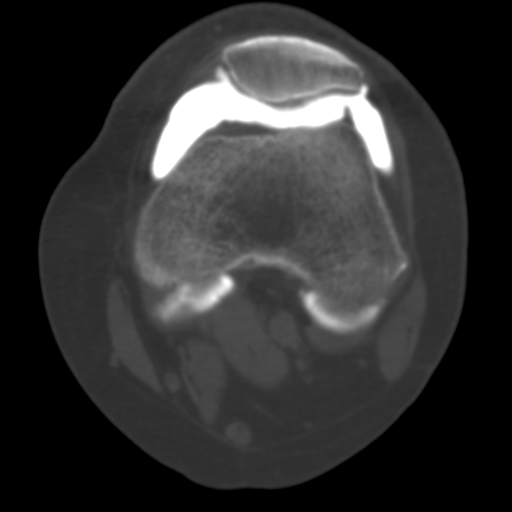
[im 43/63  soft-tissue]
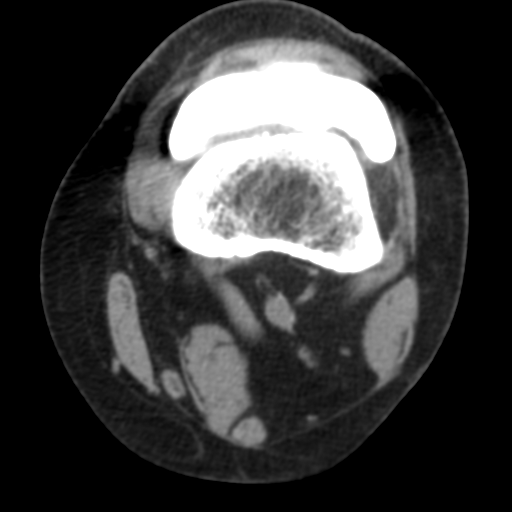
[im 43/63  bone]
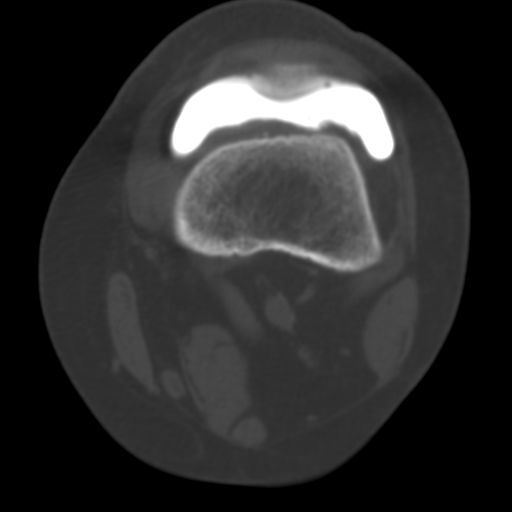
[im 48/63  bone]
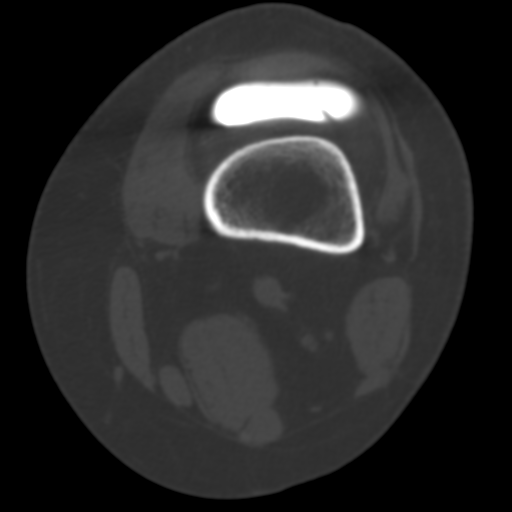
[im 53/63  bone]
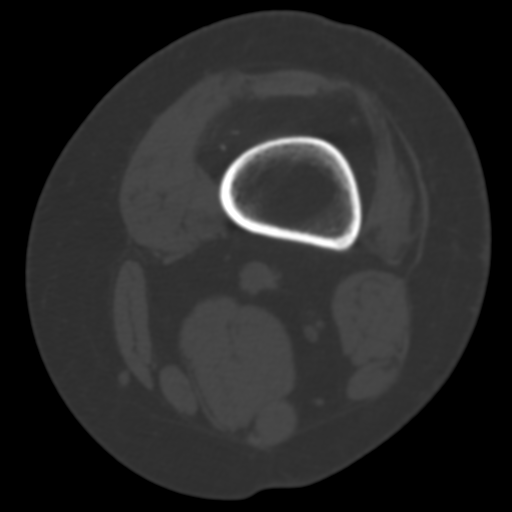
[im 58/63  bone]
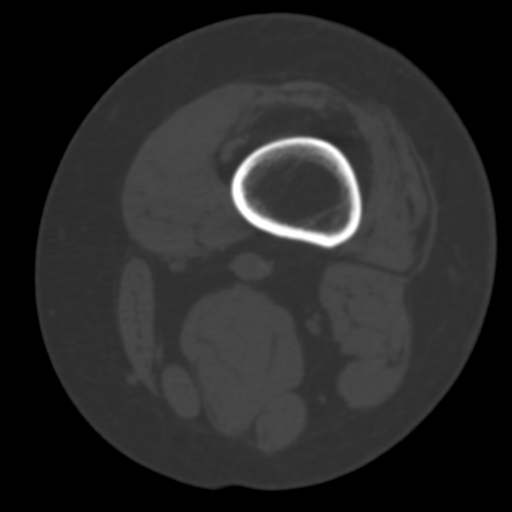

[12 of 14 positions shown; findings below may reference images not displayed]

FINDINGS: MENISCI

Medial meniscus: Small longitudinal undersurface tear of the mid
body with mild extrusion (series 7, image 22).

Lateral meniscus:  Intact.

LIGAMENTS

Cruciates: Intact ACL and PCL.

Collaterals: Intact medial collateral ligament and lateral
collateral ligament complex.

CARTILAGE

Patellofemoral: Mild partial-thickness cartilage loss and fissuring
over the patella.

Medial: Mild diffuse cartilage thinning without focal defect.

Lateral: Small focal near full-thickness cartilage defect over the
posterior weight-bearing lateral femoral condyle (series 8, image
35; series 7, image 29).

MISCELLANEOUS

Joint: Distended with intra-articular contrast. Focal fat stranding
in the superomedial aspect of Hoffa's fat.

Popliteal Fossa: No Baker cyst. Intact popliteus tendon.

Extensor Mechanism: Intact quadriceps and patellar tendons.

Bones:  No acute or significant extra-articular osseous findings.

Other: None.
IMPRESSION: 1. Small longitudinal undersurface tear of the medial meniscus mid
body with mild extrusion.
2. Mild tricompartmental degenerative changes.

## 2023-03-25 ENCOUNTER — Ambulatory Visit
Admission: RE | Admit: 2023-03-25 | Discharge: 2023-03-25 | Disposition: A | Discharge status: Home / Self Care | Payer: Medicare HMO | Source: Ambulatory Visit | Attending: Internal Medicine | Admitting: Internal Medicine

## 2023-03-25 DIAGNOSIS — N644 Mastodynia: Secondary | ICD-10-CM | POA: Diagnosis not present

## 2023-04-08 DIAGNOSIS — M25551 Pain in right hip: Secondary | ICD-10-CM | POA: Diagnosis not present

## 2023-04-14 DIAGNOSIS — M545 Low back pain, unspecified: Secondary | ICD-10-CM | POA: Diagnosis not present

## 2023-05-03 DIAGNOSIS — Z9689 Presence of other specified functional implants: Secondary | ICD-10-CM | POA: Diagnosis not present

## 2023-05-04 DIAGNOSIS — M25551 Pain in right hip: Secondary | ICD-10-CM | POA: Diagnosis not present

## 2023-05-16 NOTE — Progress Notes (Signed)
GYNECOLOGY  VISIT   HPI: 67 y.o.   Widowed  Caucasian  female   G1P0001 with Patient's last menstrual period was 06/24/2012.   here for   left side discomfort.  Pt mentioned sharp pain in the ovaries. Pt does mention vaginal dryness.  Can occur 2 - 3 times a month for about 8 months. Lasts 10 minutes and is sharp and excrutiating.   No back pain.   No dysuria.  No hematuria.  Hx renal stones.   No vaginal bleeding.   Notes vaginal dryness and burning.  Used vaginal estrogen cream in the past.   Vulvar lesions for years.   Sees GI at [facility] for chronic constipation.  Takes Senna, not daily.  Having uncontrolled bowel movements and needs to wear protection.   Has a spinal cord stimulator for chronic back pain.  Battery replaced in December.  Not sexually active since husband passed 2 years ago.   GYNECOLOGIC HISTORY: Patient's last menstrual period was 06/24/2012. Contraception:  PMP Menopausal hormone therapy:  n/a Last mammogram:  03/25/23 Breast density Cat B, BI-RADS CAT 1 neg Last pap smear:   02/11/22 neg: HR HPV neg        OB History     Gravida  1   Para  1   Term  0   Preterm  0   AB  0   Living  1      SAB  0   IAB  0   Ectopic  0   Multiple      Live Births                 Patient Active Problem List   Diagnosis Date Noted   Routine general medical examination at a health care facility 02/14/2023   Right hip pain 02/14/2023   Hyperlipidemia 08/27/2022   GERD (gastroesophageal reflux disease) 08/27/2022   Pain in right hand 05/07/2020   Lumbar facet joint syndrome 11/15/2019   Lumbar spondylosis 11/15/2019   Pain in left knee 06/07/2017   Snoring 09/27/2016   Other chronic pain 09/27/2016   CKD (chronic kidney disease), stage III (HCC) 09/27/2016   Failed back syndrome, lumbar 09/27/2016   Positive for microalbuminuria 09/27/2016   Essential hypertension 09/27/2016   Vaginal atrophy 02/18/2014   Endometriosis    Chronic  hepatitis C (HCC) 09/15/2011   DEPRESSION/ANXIETY 01/24/2008   CONSTIPATION, CHRONIC 04/27/2007   INSOMNIA, PERSISTENT 04/06/2007   Postoperative hypothyroidism 02/07/2007   OSTEOPENIA 02/07/2007    Past Medical History:  Diagnosis Date   Arthritis    DDD (degenerative disc disease), lumbar    Diabetes mellitus 1-13   patient denies and says never made aware;  BORDERLINE DR. BALAN-diet controlled;    GERD (gastroesophageal reflux disease)    Hepatitis    genotype 1b hepatitis C virus, monitored by [facility] infectious disease group   History of bronchitis    Hyperlipidemia    Hypertension    Spinal cord stimulator status    placed 2006   Thyroid disease    takes synthroid    Past Surgical History:  Procedure Laterality Date   Anterior cervical decompression and fusion with a reflex hyper plate  40/06/8118   C4-C5 and C5-C6 , Dr. Judie Petit. Roy   BREAST EXCISIONAL BIOPSY Left 1982   BREAST SURGERY  1983   left breast cyst removed   cervical fusion w/OASIS system infuse  05/13/2006   C4-C5 and C5-C6 and C6-7,  Dr. Marnee Spring  CHOLECYSTECTOMY  ?2002   CHOLECYSTECTOMY  2002   COLONOSCOPY     ESOPHAGEAL DILATION     Dr. Madilyn Fireman   KNEE SURGERY  06/2017   LAPAROSCOPIC ENDOMETRIOSIS FULGURATION  1980's   Dr. Randell Patient   LIVER BIOPSY  2002   LOWER BACK SURGERY  OCT 2016   LUMBAR DISC SURGERY  08/13/2003   Diskectomy/fusion L5-S1 rt., Dr. Marnee Spring   lumbar hemilaminectomy  01/1989   L5-S1 on right, Dr. Shela Commons   NASAL SINUS SURGERY  10/30/2007   Left upper Sinus lift,  Dr. Clemetine Marker   SHOULDER ARTHROSCOPY WITH SUBACROMIAL DECOMPRESSION Right 12/06/2014   Procedure: SHOULDER ARTHROSCOPY WITH SUBACROMIAL DECOMPRESSION; EXCISION BONE SPUR;  Surgeon: Dominica Severin, MD;  Location: [facility];  Service: Orthopedics;  Laterality: Right;   SHOULDER SURGERY Right 12/06/14   "cleaned up bone spurs"   SHOULDER SURGERY Right FEB 2016   SPINAL CORD STIMULATOR INSERTION  12/29/2004   leads placed  along spine from T8 to T10, Dr. Judie Petit. Roy   Spinal cord stimulator pulse generator  01/05/2005   placed in right upper buttocks, Dr. Judie Petit. Roy   THYROID LOBECTOMY Right 02/13/2016   THYROIDECTOMY Right 02/13/2016   Procedure: RIGHT THYROID LOBECTOMY WITH FROZEN SECTION ;  Surgeon: Serena Colonel, MD;  Location: [facility] OR;  Service: ENT;  Laterality: Right;   TONSILLECTOMY      Current Outpatient Medications  Medication Sig Dispense Refill   cyclobenzaprine (FLEXERIL) 5 MG tablet Take 1 tablet (5 mg total) by mouth 3 (three) times daily as needed for muscle spasms. 60 tablet 3   escitalopram (LEXAPRO) 20 MG tablet Take 20 mg by mouth at bedtime.     fluticasone (FLONASE) 50 MCG/ACT nasal spray Place 2 sprays into the nose daily as needed for allergies.      lamoTRIgine (LAMICTAL) 100 MG tablet Take 200 mg by mouth daily.     levothyroxine (SYNTHROID) 112 MCG tablet Take 125 mcg by mouth 6 (six) times daily. 100 mg daily     lisinopril-hydrochlorothiazide (ZESTORETIC) 20-12.5 MG tablet Take 0.5 tablets by mouth every morning. 90 tablet 1   pantoprazole (PROTONIX) 40 MG tablet Take 1 tablet (40 mg total) by mouth daily. Annual appt is due must see provider for future refills 90 tablet 3   pravastatin (PRAVACHOL) 20 MG tablet Take 1 tablet (20 mg total) by mouth daily. 90 tablet 3   rizatriptan (MAXALT) 10 MG tablet Take 1 tablet earliest onset of migraine.  May repeat in 2 hours if needed.  Maximum 2 tablets in 24 hours. 10 tablet 5   traZODone (DESYREL) 150 MG tablet Take 1 tablet (150 mg total) by mouth at bedtime. 90 tablet 3   No current facility-administered medications for this visit.     ALLERGIES: Codeine, Imitrex [sumatriptan], and Morphine  Family History  Problem Relation Age of Onset   Hypertension Father    Cancer Father        colon, liver   Atrial fibrillation Father    Hypertension Sister    Hypertension Brother    Cancer Brother        lung, adrenal glands and brain   Hypertension  Brother    Aneurysm Mother        brain   Stroke Mother 35    Social History   Socioeconomic History   Marital status: Widowed    Spouse name: Not on file   Number of children: Not on file   Years  of education: Not on file   Highest education level: Not on file  Occupational History   Not on file  Tobacco Use   Smoking status: Former    Current packs/day: 0.00    Types: Cigarettes    Quit date: 60    Years since quitting: 32.6   Smokeless tobacco: Never   Tobacco comments:    quit smoking in 1992  Vaping Use   Vaping status: Never Used  Substance and Sexual Activity   Alcohol use: Yes    Alcohol/week: 0.0 standard drinks of alcohol    Comment: occ   Drug use: No   Sexual activity: Not Currently    Birth control/protection: Post-menopausal  Other Topics Concern   Not on file  Social History Narrative   Walks the dog exercise.  Has to stop breath while walking the dog.    Drinks about 3 caffeine drinks a week    Currently disabled after 8 back surgeries with spinal cord/injury   Right handed   Lives with husband one story home   Social Determinants of Health   Financial Resource Strain: Not on file  Food Insecurity: Not on file  Transportation Needs: Not on file  Physical Activity: Not on file  Stress: Not on file  Social Connections: Not on file  Intimate Partner Violence: Not on file    Review of Systems  Genitourinary:  Positive for pelvic pain.    PHYSICAL EXAMINATION:    BP 118/80 (BP Location: Left Arm, Patient Position: Sitting, Cuff Size: Normal)   Pulse 74   Ht 5\' 6"  (1.676 m)   Wt 176 lb (79.8 kg)   LMP 06/24/2012   SpO2 98%   BMI 28.41 kg/m     General appearance: alert, cooperative and appears stated age   Abdomen: soft, mildy tender to palpation in RLQ, no pain in LLQ, no masses,  no organomegaly Back:  no CVA tenderness.  No abnormal inguinal nodes palpated   Pelvic: External genitalia:  right and left verrucous lesions.                Urethra:  normal appearing urethra with no masses, tenderness or lesions              Bartholins and Skenes: normal                 Vagina: normal appearing vagina with normal color and discharge, no lesions.   Speculum exam painful.                Cervix: no lesions                Bimanual Exam:  Uterus:  normal size, contour, position, consistency, mobility, non-tender              Adnexa: no mass, fullness, tenderness              Rectal exam: yes.  Confirms.              Anus:  normal sphincter tone, no lesions  Chaperone was present for exam:  Warren Lacy, CMA  ASSESSMENT  Pelvic pain, LLQ pain.  Hx renal stones.  Chronic constipation.  Vulvar lesions.  Vaginal atrophy.  Hx spinal cord stimulator.   PLAN  Pelvic US. Likely will have CT of abdomen and pelvis. UA and UC.  Pap and HR HPV collected. Return for colposcopy.  Use vaginal estradiol cream.  Instructed in use.   35 min  total  time was spent for this patient encounter, including preparation, face-to-face counseling with the patient, coordination of care, and documentation of the encounter.

## 2023-05-23 ENCOUNTER — Encounter: Payer: Self-pay | Admitting: Obstetrics and Gynecology

## 2023-05-23 ENCOUNTER — Other Ambulatory Visit (HOSPITAL_COMMUNITY)
Admission: RE | Admit: 2023-05-23 | Discharge: 2023-05-23 | Disposition: A | Discharge status: Home / Self Care | Payer: Medicare HMO | Source: Ambulatory Visit | Attending: Obstetrics and Gynecology | Admitting: Obstetrics and Gynecology

## 2023-05-23 ENCOUNTER — Ambulatory Visit (INDEPENDENT_AMBULATORY_CARE_PROVIDER_SITE_OTHER): Payer: Medicare HMO | Admitting: Obstetrics and Gynecology

## 2023-05-23 ENCOUNTER — Telehealth: Payer: Self-pay | Admitting: Obstetrics and Gynecology

## 2023-05-23 VITALS — BP 118/80 | HR 74 | Ht 66.0 in | Wt 176.0 lb

## 2023-05-23 DIAGNOSIS — Z1151 Encounter for screening for human papillomavirus (HPV): Secondary | ICD-10-CM | POA: Insufficient documentation

## 2023-05-23 DIAGNOSIS — R1032 Left lower quadrant pain: Secondary | ICD-10-CM | POA: Diagnosis not present

## 2023-05-23 DIAGNOSIS — R102 Pelvic and perineal pain: Secondary | ICD-10-CM

## 2023-05-23 DIAGNOSIS — Z124 Encounter for screening for malignant neoplasm of cervix: Secondary | ICD-10-CM

## 2023-05-23 DIAGNOSIS — Z01411 Encounter for gynecological examination (general) (routine) with abnormal findings: Secondary | ICD-10-CM | POA: Diagnosis not present

## 2023-05-23 DIAGNOSIS — N9089 Other specified noninflammatory disorders of vulva and perineum: Secondary | ICD-10-CM | POA: Insufficient documentation

## 2023-05-23 DIAGNOSIS — N952 Postmenopausal atrophic vaginitis: Secondary | ICD-10-CM | POA: Diagnosis not present

## 2023-05-23 LAB — URINALYSIS, COMPLETE W/RFL CULTURE
Bacteria, UA: NONE SEEN /HPF
Bilirubin Urine: NEGATIVE
Glucose, UA: NEGATIVE
Hgb urine dipstick: NEGATIVE
Hyaline Cast: NONE SEEN /LPF
Ketones, ur: NEGATIVE
Nitrites, Initial: NEGATIVE
Protein, ur: NEGATIVE
Specific Gravity, Urine: 1.01 (ref 1.001–1.035)
pH: 6 (ref 5.0–8.0)

## 2023-05-23 LAB — CULTURE INDICATED

## 2023-05-23 MED ORDER — ESTRADIOL 0.1 MG/GM VA CREA: TOPICAL_CREAM | VAGINAL | 1 refills | Status: DC

## 2023-05-23 NOTE — Telephone Encounter (Signed)
Order placed for PUS at [facility].

## 2023-05-23 NOTE — Telephone Encounter (Signed)
Please schedule pelvic ultrasound for my patient who has LLQ pain.

## 2023-05-24 ENCOUNTER — Encounter: Payer: Self-pay | Admitting: Internal Medicine

## 2023-05-24 ENCOUNTER — Ambulatory Visit (INDEPENDENT_AMBULATORY_CARE_PROVIDER_SITE_OTHER): Payer: Medicare HMO | Admitting: Internal Medicine

## 2023-05-24 VITALS — BP 126/80 | HR 71 | Temp 98.2°F | Ht 66.0 in | Wt 176.0 lb

## 2023-05-24 DIAGNOSIS — N1831 Chronic kidney disease, stage 3a: Secondary | ICD-10-CM | POA: Diagnosis not present

## 2023-05-24 DIAGNOSIS — I10 Essential (primary) hypertension: Secondary | ICD-10-CM | POA: Diagnosis not present

## 2023-05-24 LAB — COMPREHENSIVE METABOLIC PANEL
ALT: 10 U/L (ref 0–35)
AST: 11 U/L (ref 0–37)
Albumin: 3.9 g/dL (ref 3.5–5.2)
Alkaline Phosphatase: 48 U/L (ref 39–117)
BUN: 22 mg/dL (ref 6–23)
CO2: 25 mEq/L (ref 19–32)
Calcium: 8.9 mg/dL (ref 8.4–10.5)
Chloride: 104 mEq/L (ref 96–112)
Creatinine, Ser: 1.29 mg/dL — ABNORMAL HIGH (ref 0.40–1.20)
GFR: 43.15 mL/min — ABNORMAL LOW (ref >60.00)
Glucose, Bld: 87 mg/dL (ref 70–99)
Potassium: 4 mEq/L (ref 3.5–5.1)
Sodium: 138 mEq/L (ref 135–145)
Total Bilirubin: 0.4 mg/dL (ref 0.2–1.2)
Total Protein: 6.4 g/dL (ref 6.0–8.3)

## 2023-05-24 LAB — MAGNESIUM: Magnesium: 2.1 mg/dL (ref 1.5–2.5)

## 2023-05-24 NOTE — Patient Instructions (Signed)
We will check the labs today.    

## 2023-05-24 NOTE — Assessment & Plan Note (Signed)
Checking renal function today and adjust regimen if persistently elevated will hold ACE-I for 2-3 months and monitor renal function.

## 2023-05-24 NOTE — Progress Notes (Signed)
   Subjective:   Patient ID: [name], female    DOB: [birth year], 67 y.o.   MRN: [id number]  HPI The patient is a 67 YO female coming in for elevated creatinine follow up. Last labs prior to that 2021.   Review of Systems  Constitutional: Negative.   HENT: Negative.    Eyes: Negative.   Respiratory:  Negative for cough, chest tightness and shortness of breath.   Cardiovascular:  Negative for chest pain, palpitations and leg swelling.  Gastrointestinal:  Negative for abdominal distention, abdominal pain, constipation, diarrhea, nausea and vomiting.  Musculoskeletal: Negative.   Skin: Negative.   Neurological: Negative.   Psychiatric/Behavioral: Negative.      Objective:  Physical Exam Constitutional:      Appearance: She is well-developed.  HENT:     Head: Normocephalic and atraumatic.  Cardiovascular:     Rate and Rhythm: Normal rate and regular rhythm.  Pulmonary:     Effort: Pulmonary effort is normal. No respiratory distress.     Breath sounds: Normal breath sounds. No wheezing or rales.  Abdominal:     General: Bowel sounds are normal. There is no distension.     Palpations: Abdomen is soft.     Tenderness: There is no abdominal tenderness. There is no rebound.  Musculoskeletal:     Cervical back: Normal range of motion.  Skin:    General: Skin is warm and dry.  Neurological:     Mental Status: She is alert and oriented to person, place, and time.     Coordination: Coordination normal.     Vitals:   05/24/23 1056  BP: 126/80  Pulse: 71  Temp: 98.2 F (36.8 C)  TempSrc: Oral  SpO2: 98%  Weight: 176 lb (79.8 kg)  Height: 5\' 6"  (1.676 m)    Assessment & Plan:

## 2023-05-24 NOTE — Assessment & Plan Note (Signed)
BP at goal on lisinopril/hydrochlorothiazide 20/12.5 (takes 1/2 pill daily). Checking CMP and if persistently elevated creatinine will hold ACE-I for 2-3 months and recheck. Discussed this plan with her.

## 2023-05-25 NOTE — Telephone Encounter (Signed)
Patient scheduled for PUS at [facility] on 05/30/23 at 1100, arrive at 1045 with full bladder, drink 32 oz water 1 hr prior.   Spoke with patient, advised as seen above. Patient verbalizes understanding and is agreeable.

## 2023-05-30 ENCOUNTER — Ambulatory Visit (HOSPITAL_BASED_OUTPATIENT_CLINIC_OR_DEPARTMENT_OTHER)
Admission: RE | Admit: 2023-05-30 | Discharge: 2023-05-30 | Disposition: A | Discharge status: Home / Self Care | Payer: Medicare HMO | Source: Ambulatory Visit | Attending: Obstetrics and Gynecology | Admitting: Obstetrics and Gynecology

## 2023-05-30 DIAGNOSIS — R1032 Left lower quadrant pain: Secondary | ICD-10-CM | POA: Diagnosis not present

## 2023-05-30 DIAGNOSIS — N888 Other specified noninflammatory disorders of cervix uteri: Secondary | ICD-10-CM | POA: Diagnosis not present

## 2023-06-02 ENCOUNTER — Other Ambulatory Visit: Payer: Self-pay | Admitting: *Deleted

## 2023-06-02 DIAGNOSIS — R1032 Left lower quadrant pain: Secondary | ICD-10-CM

## 2023-06-07 NOTE — Progress Notes (Signed)
GYNECOLOGY  VISIT   HPI: 68 y.o.   Widowed  Caucasian  female   G1P0001 with Patient's last menstrual period was 06/24/2012.   here for   colposcopy and biopsy of the vulva due to  She has areas raised present for several years.  The right sided area is getting larger.    Has used lidocaine to the vulva in the past to treat discomfort.   She has a CT scan today due to LLQ pain.  GYNECOLOGIC HISTORY: Patient's last menstrual period was 06/24/2012. Contraception:  PMP Menopausal hormone therapy:  n/a Last mammogram:  03/25/23 Breast density Cat B, BI-RADS CAT 1 neg  Last pap smear: 05/23/23 neg: HR HPV neg,  02/11/22 neg: HR HPV neg         OB History     Gravida  1   Para  1   Term  0   Preterm  0   AB  0   Living  1      SAB  0   IAB  0   Ectopic  0   Multiple      Live Births                 Patient Active Problem List   Diagnosis Date Noted   Routine general medical examination at a health care facility 02/14/2023   Right hip pain 02/14/2023   Hyperlipidemia 08/27/2022   GERD (gastroesophageal reflux disease) 08/27/2022   Pain in right hand 05/07/2020   Lumbar facet joint syndrome 11/15/2019   Lumbar spondylosis 11/15/2019   Pain in left knee 06/07/2017   Snoring 09/27/2016   Other chronic pain 09/27/2016   CKD (chronic kidney disease), stage III (HCC) 09/27/2016   Failed back syndrome, lumbar 09/27/2016   Positive for microalbuminuria 09/27/2016   Essential hypertension 09/27/2016   Vaginal atrophy 02/18/2014   Endometriosis    Chronic hepatitis C (HCC) 09/15/2011   DEPRESSION/ANXIETY 01/24/2008   CONSTIPATION, CHRONIC 04/27/2007   INSOMNIA, PERSISTENT 04/06/2007   Postoperative hypothyroidism 02/07/2007   OSTEOPENIA 02/07/2007    Past Medical History:  Diagnosis Date   Arthritis    DDD (degenerative disc disease), lumbar    Diabetes mellitus 1-13   patient denies and says never made aware;  BORDERLINE DR. BALAN-diet controlled;    GERD  (gastroesophageal reflux disease)    Hepatitis    genotype 1b hepatitis C virus, monitored by [facility] infectious disease group   History of bronchitis    Hyperlipidemia    Hypertension    Spinal cord stimulator status    placed 2006   Thyroid disease    takes synthroid    Past Surgical History:  Procedure Laterality Date   Anterior cervical decompression and fusion with a reflex hyper plate  78/11/9560   C4-C5 and C5-C6 , Dr. Judie Petit. Roy   BREAST EXCISIONAL BIOPSY Left 1982   BREAST SURGERY  1983   left breast cyst removed   cervical fusion w/OASIS system infuse  05/13/2006   C4-C5 and C5-C6 and C6-7,  Dr. Marnee Spring   CHOLECYSTECTOMY  ?2002   CHOLECYSTECTOMY  2002   COLONOSCOPY     ESOPHAGEAL DILATION     Dr. Madilyn Fireman   KNEE SURGERY  06/2017   LAPAROSCOPIC ENDOMETRIOSIS FULGURATION  1980's   Dr. Randell Patient   LIVER BIOPSY  2002   LOWER BACK SURGERY  OCT 2016   LUMBAR DISC SURGERY  08/13/2003   Diskectomy/fusion L5-S1 rt., Dr. Marnee Spring   lumbar  hemilaminectomy  01/1989   L5-S1 on right, Dr. Shela Commons   NASAL SINUS SURGERY  10/30/2007   Left upper Sinus lift,  Dr. Clemetine Marker   SHOULDER ARTHROSCOPY WITH SUBACROMIAL DECOMPRESSION Right 12/06/2014   Procedure: SHOULDER ARTHROSCOPY WITH SUBACROMIAL DECOMPRESSION; EXCISION BONE SPUR;  Surgeon: Dominica Severin, MD;  Location: [facility];  Service: Orthopedics;  Laterality: Right;   SHOULDER SURGERY Right 12/06/14   "cleaned up bone spurs"   SHOULDER SURGERY Right FEB 2016   SPINAL CORD STIMULATOR INSERTION  12/29/2004   leads placed along spine from T8 to T10, Dr. Judie Petit. Roy   Spinal cord stimulator pulse generator  01/05/2005   placed in right upper buttocks, Dr. Judie Petit. Roy   THYROID LOBECTOMY Right 02/13/2016   THYROIDECTOMY Right 02/13/2016   Procedure: RIGHT THYROID LOBECTOMY WITH FROZEN SECTION ;  Surgeon: Serena Colonel, MD;  Location: [facility] OR;  Service: ENT;  Laterality: Right;   TONSILLECTOMY      Current Outpatient Medications  Medication Sig  Dispense Refill   cyclobenzaprine (FLEXERIL) 5 MG tablet Take 1 tablet (5 mg total) by mouth 3 (three) times daily as needed for muscle spasms. 60 tablet 3   escitalopram (LEXAPRO) 20 MG tablet Take 20 mg by mouth at bedtime.     estradiol (ESTRACE) 0.1 MG/GM vaginal cream Use 1/2 g vaginally every night for the first 2 weeks, then use 1/2 g vaginally two or three times per week as needed to maintain symptom relief. 42.5 g 1   fluticasone (FLONASE) 50 MCG/ACT nasal spray Place 2 sprays into the nose daily as needed for allergies.      lamoTRIgine (LAMICTAL) 100 MG tablet Take 200 mg by mouth daily.     levothyroxine (SYNTHROID) 112 MCG tablet Take 125 mcg by mouth 6 (six) times daily. 100 mg daily     lisinopril-hydrochlorothiazide (ZESTORETIC) 20-12.5 MG tablet Take 0.5 tablets by mouth every morning. 90 tablet 1   pantoprazole (PROTONIX) 40 MG tablet Take 1 tablet (40 mg total) by mouth daily. Annual appt is due must see provider for future refills 90 tablet 3   pravastatin (PRAVACHOL) 20 MG tablet Take 1 tablet (20 mg total) by mouth daily. 90 tablet 3   rizatriptan (MAXALT) 10 MG tablet Take 1 tablet earliest onset of migraine.  May repeat in 2 hours if needed.  Maximum 2 tablets in 24 hours. 10 tablet 5   traZODone (DESYREL) 150 MG tablet Take 1 tablet (150 mg total) by mouth at bedtime. 90 tablet 3   No current facility-administered medications for this visit.     ALLERGIES: Codeine, Imitrex [sumatriptan], and Morphine  Family History  Problem Relation Age of Onset   Hypertension Father    Cancer Father        colon, liver   Atrial fibrillation Father    Hypertension Sister    Hypertension Brother    Cancer Brother        lung, adrenal glands and brain   Hypertension Brother    Aneurysm Mother        brain   Stroke Mother 40    Social History   Socioeconomic History   Marital status: Widowed    Spouse name: Not on file   Number of children: Not on file   Years of  education: Not on file   Highest education level: Not on file  Occupational History   Not on file  Tobacco Use   Smoking status: Former    Current  packs/day: 0.00    Types: Cigarettes    Quit date: 24    Years since quitting: 32.6   Smokeless tobacco: Never   Tobacco comments:    quit smoking in 1992  Vaping Use   Vaping status: Never Used  Substance and Sexual Activity   Alcohol use: Yes    Alcohol/week: 0.0 standard drinks of alcohol    Comment: occ   Drug use: No   Sexual activity: Not Currently    Birth control/protection: Post-menopausal  Other Topics Concern   Not on file  Social History Narrative   Walks the dog exercise.  Has to stop breath while walking the dog.    Drinks about 3 caffeine drinks a week    Currently disabled after 8 back surgeries with spinal cord/injury   Right handed   Lives with husband one story home   Social Determinants of Health   Financial Resource Strain: Not on file  Food Insecurity: Not on file  Transportation Needs: Not on file  Physical Activity: Not on file  Stress: Not on file  Social Connections: Not on file  Intimate Partner Violence: Not on file    Review of Systems  All other systems reviewed and are negative.   PHYSICAL EXAMINATION:    Ht 5\' 6"  (1.676 m)   Wt 176 lb (79.8 kg)   LMP 06/24/2012   BMI 28.41 kg/m     General appearance: alert, cooperative and appears stated age   Colposcopy - vulva.  Consent for procedure.  3% acetic acid used on vulva. Findings:    Sterile prep with Hibiclens.  Local 1% lidocaine, lot [id number], exp July 2026.  Right labia minora with verrucous raised lesion approx 1.5 cm total in length.  4 mm punch biopsy done of this location.   Tissue to pathology.  Simple suture of 3/0 Vicryl. Left labia majora with pink coloration of tissue approx 2.5 cm area with 2 raised verrucous lesions on the the lower portion of this area.  Biopsy done of more flat area.  Tissue to pathology.  Simple  suture of 3/0 Vicryl.  Smaller sub millimeter raised area of verrucous change closer to the introitus.   Anus no lesions noted.  Minimal EBL. No complications.   Chaperone was present for exam:  Warren Lacy. CMA.  ASSESSMENT  Vulvar lesions.   Possible condyloma versus dysplasia. LLQ pain.      PLAN  Fu biopsies.  Final plan to follow.  Consider Aldara cream if appropriate. CT scan of abd and pelvis today.

## 2023-06-08 DIAGNOSIS — K582 Mixed irritable bowel syndrome: Secondary | ICD-10-CM | POA: Diagnosis not present

## 2023-06-09 ENCOUNTER — Other Ambulatory Visit: Payer: Self-pay | Admitting: Obstetrics and Gynecology

## 2023-06-21 ENCOUNTER — Encounter: Payer: Self-pay | Admitting: Obstetrics and Gynecology

## 2023-06-21 ENCOUNTER — Ambulatory Visit (INDEPENDENT_AMBULATORY_CARE_PROVIDER_SITE_OTHER): Payer: Medicare HMO | Admitting: Obstetrics and Gynecology

## 2023-06-21 ENCOUNTER — Other Ambulatory Visit (HOSPITAL_COMMUNITY)
Admission: RE | Admit: 2023-06-21 | Discharge: 2023-06-21 | Disposition: A | Discharge status: Home / Self Care | Payer: Medicare HMO | Source: Ambulatory Visit | Attending: Obstetrics and Gynecology | Admitting: Obstetrics and Gynecology

## 2023-06-21 ENCOUNTER — Ambulatory Visit
Admission: RE | Admit: 2023-06-21 | Discharge: 2023-06-21 | Disposition: A | Discharge status: Home / Self Care | Payer: Medicare HMO | Source: Ambulatory Visit | Attending: Obstetrics and Gynecology | Admitting: Obstetrics and Gynecology

## 2023-06-21 VITALS — BP 126/80 | HR 68 | Ht 66.0 in | Wt 176.0 lb

## 2023-06-21 DIAGNOSIS — N9089 Other specified noninflammatory disorders of vulva and perineum: Secondary | ICD-10-CM | POA: Insufficient documentation

## 2023-06-21 DIAGNOSIS — N900 Mild vulvar dysplasia: Secondary | ICD-10-CM | POA: Diagnosis not present

## 2023-06-21 DIAGNOSIS — R1032 Left lower quadrant pain: Secondary | ICD-10-CM

## 2023-06-21 MED ORDER — IOPAMIDOL (ISOVUE-370) INJECTION 76%
500.0000 mL | Freq: Once | INTRAVENOUS | Status: AC | PRN
  Administered 2023-06-21: 75 mL via INTRAVENOUS

## 2023-06-21 NOTE — Patient Instructions (Signed)
 Vulva Biopsy, Care After The following information offers guidance on how to care for yourself after your procedure. Your health care provider may also give you more specific instructions. If you have problems or questions, contact your health care provider. What can I expect after the procedure? After the procedure, it is common to have: Slight bleeding from the biopsy site. Slight pain or discomfort at the biopsy site. Follow these instructions at home: Biopsy site care  Follow instructions from your health care provider about how to take care of your biopsy site. Make sure you: Clean the area using water and mild soap twice a day or as told by your health care provider. Gently pat the area dry. You may shower 24 hours after the procedure. If you were prescribed an antibiotic ointment, apply it as told by your health care provider. Do not stop using the antibiotic even if your condition improves. If told by your health care provider, take a sitz bath to help with pain and discomfort. This is a warm water bath that you take while sitting down. Do this as often as told by your health care provider. The water should only come up to your hips and cover your buttocks. You may pat the area dry with a soft, clean towel. Leave stitches (sutures), skin glue, or adhesive strips in place. These skin closures may need to stay in place for 2 weeks or longer. If adhesive strip edges start to loosen and curl up, you may trim the loose edges. Do not remove adhesive strips completely unless your health care provider tells you to do that. Check your biopsy site every day for signs of infection. It may be helpful to use a handheld mirror to do this. Check for: Redness, swelling, or more pain. More fluid or blood. Warmth. Pus or a bad smell. Do not rub the biopsy area after urinating. Gently pat the area dry or use a bottle filled with warm water (peri bottle) to clean the area. Gently wipe from front to  back. Lifestyle Wear loose, cotton underwear. Do not wear tight pants. For at least 1 week or until your health care provider approves: Do not use tampons, douche, or put anything inside your vagina. Do not have sex. Until your health care provider approves: Do not exercise, such as running or biking. Do not swim or use a hot tub. General instructions Take over-the-counter and prescription medicines only as told by your health care provider. Drink enough fluid to keep your urine pale yellow. Use a sanitary napkin until the bleeding stops. If told, put ice on the biopsy site. To do this: Place ice in a plastic bag. Place a towel between your skin and the bag. Leave the ice on for 20 minutes, 2-3 times a day. Remove the ice if your skin turns bright red. This is very important. If you cannot feel pain, heat, or cold, you have a greater risk of damage to the area. Keep all follow-up visits. This is important. Contact a health care provider if: You have redness, swelling, or more pain around your biopsy site. You have more fluid or blood coming from your biopsy site. Your biopsy site feels warm to the touch. Your pain is not controlled with medicine or ice packs. You have a fever or chills. Get help right away if: You have heavy bleeding from the vulva. You have pus or a bad smell coming from the biopsy site. You have abdominal pain. Summary After the procedure, it  is common to have slight bleeding and discomfort at the biopsy site. Follow instructions from your health care provider after your biopsy. Take sitz baths as told by your health care provider to help with pain and discomfort. Leave any sutures in place. Contact your health care provider if you notice any signs of infection around the biopsy site, including redness, swelling, more pain, more fluid or blood, or warmth. Keep all follow-up visits. This is important. This information is not intended to replace advice given to you  by your health care provider. Make sure you discuss any questions you have with your health care provider. Document Revised: 06/23/2021 Document Reviewed: 06/23/2021 Elsevier Patient Education  2024 [employer].

## 2023-06-23 LAB — SURGICAL PATHOLOGY

## 2023-07-06 DIAGNOSIS — F331 Major depressive disorder, recurrent, moderate: Secondary | ICD-10-CM | POA: Diagnosis not present

## 2023-07-06 DIAGNOSIS — F410 Panic disorder [episodic paroxysmal anxiety] without agoraphobia: Secondary | ICD-10-CM | POA: Diagnosis not present

## 2023-07-08 NOTE — Progress Notes (Unsigned)
GYNECOLOGY  VISIT   HPI: 67 y.o.   Widowed  Caucasian  female   G1P0001 with Patient's last menstrual period was 06/24/2012.   here for   discuss bx results  GYNECOLOGIC HISTORY: Patient's last menstrual period was 06/24/2012. Contraception:  PMP Menopausal hormone therapy:  n/a Last mammogram:  03/25/23 Breast density Cat B, BI-RADS CAT 1 neg  Last pap smear:   05/23/23 neg: HR HPV neg,  02/11/22 neg: HR HPV neg         OB History     Gravida  1   Para  1   Term  0   Preterm  0   AB  0   Living  1      SAB  0   IAB  0   Ectopic  0   Multiple      Live Births                 Patient Active Problem List   Diagnosis Date Noted   Routine general medical examination at a health care facility 02/14/2023   Right hip pain 02/14/2023   Hyperlipidemia 08/27/2022   GERD (gastroesophageal reflux disease) 08/27/2022   Pain in right hand 05/07/2020   Lumbar facet joint syndrome 11/15/2019   Lumbar spondylosis 11/15/2019   Pain in left knee 06/07/2017   Snoring 09/27/2016   Other chronic pain 09/27/2016   CKD (chronic kidney disease), stage III (HCC) 09/27/2016   Failed back syndrome, lumbar 09/27/2016   Positive for microalbuminuria 09/27/2016   Essential hypertension 09/27/2016   Vaginal atrophy 02/18/2014   Endometriosis    Chronic hepatitis C (HCC) 09/15/2011   DEPRESSION/ANXIETY 01/24/2008   CONSTIPATION, CHRONIC 04/27/2007   INSOMNIA, PERSISTENT 04/06/2007   Postoperative hypothyroidism 02/07/2007   OSTEOPENIA 02/07/2007    Past Medical History:  Diagnosis Date   Arthritis    DDD (degenerative disc disease), lumbar    Diabetes mellitus 1-13   patient denies and says never made aware;  BORDERLINE DR. BALAN-diet controlled;    GERD (gastroesophageal reflux disease)    Hepatitis    genotype 1b hepatitis C virus, monitored by [facility] infectious disease group   History of bronchitis    Hyperlipidemia    Hypertension    Spinal cord stimulator status     placed 2006   Thyroid disease    takes synthroid    Past Surgical History:  Procedure Laterality Date   Anterior cervical decompression and fusion with a reflex hyper plate  40/06/8118   C4-C5 and C5-C6 , Dr. Judie Petit. Roy   BREAST EXCISIONAL BIOPSY Left 1982   BREAST SURGERY  1983   left breast cyst removed   cervical fusion w/OASIS system infuse  05/13/2006   C4-C5 and C5-C6 and C6-7,  Dr. Marnee Spring   CHOLECYSTECTOMY  ?2002   CHOLECYSTECTOMY  2002   COLONOSCOPY     ESOPHAGEAL DILATION     Dr. Madilyn Fireman   KNEE SURGERY  06/2017   LAPAROSCOPIC ENDOMETRIOSIS FULGURATION  1980's   Dr. Randell Patient   LIVER BIOPSY  2002   LOWER BACK SURGERY  OCT 2016   LUMBAR DISC SURGERY  08/13/2003   Diskectomy/fusion L5-S1 rt., Dr. Marnee Spring   lumbar hemilaminectomy  01/1989   L5-S1 on right, Dr. Shela Commons   NASAL SINUS SURGERY  10/30/2007   Left upper Sinus lift,  Dr. Clemetine Marker   SHOULDER ARTHROSCOPY WITH SUBACROMIAL DECOMPRESSION Right 12/06/2014   Procedure: SHOULDER ARTHROSCOPY WITH SUBACROMIAL DECOMPRESSION; EXCISION BONE SPUR;  Surgeon: Dominica Severin, MD;  Location: [facility];  Service: Orthopedics;  Laterality: Right;   SHOULDER SURGERY Right 12/06/14   "cleaned up bone spurs"   SHOULDER SURGERY Right FEB 2016   SPINAL CORD STIMULATOR INSERTION  12/29/2004   leads placed along spine from T8 to T10, Dr. Judie Petit. Roy   Spinal cord stimulator pulse generator  01/05/2005   placed in right upper buttocks, Dr. Judie Petit. Roy   THYROID LOBECTOMY Right 02/13/2016   THYROIDECTOMY Right 02/13/2016   Procedure: RIGHT THYROID LOBECTOMY WITH FROZEN SECTION ;  Surgeon: Serena Colonel, MD;  Location: [facility] OR;  Service: ENT;  Laterality: Right;   TONSILLECTOMY      Current Outpatient Medications  Medication Sig Dispense Refill   cyclobenzaprine (FLEXERIL) 5 MG tablet Take 1 tablet (5 mg total) by mouth 3 (three) times daily as needed for muscle spasms. 60 tablet 3   escitalopram (LEXAPRO) 20 MG tablet Take 20 mg by mouth at bedtime.      estradiol (ESTRACE) 0.1 MG/GM vaginal cream Use 1/2 g vaginally every night for the first 2 weeks, then use 1/2 g vaginally two or three times per week as needed to maintain symptom relief. 42.5 g 1   fluticasone (FLONASE) 50 MCG/ACT nasal spray Place 2 sprays into the nose daily as needed for allergies.      lamoTRIgine (LAMICTAL) 100 MG tablet Take 200 mg by mouth daily.     levothyroxine (SYNTHROID) 112 MCG tablet Take 125 mcg by mouth 6 (six) times daily. 100 mg daily     lisinopril-hydrochlorothiazide (ZESTORETIC) 20-12.5 MG tablet Take 0.5 tablets by mouth every morning. 90 tablet 1   pantoprazole (PROTONIX) 40 MG tablet Take 1 tablet (40 mg total) by mouth daily. Annual appt is due must see provider for future refills 90 tablet 3   pravastatin (PRAVACHOL) 20 MG tablet Take 1 tablet (20 mg total) by mouth daily. 90 tablet 3   rizatriptan (MAXALT) 10 MG tablet Take 1 tablet earliest onset of migraine.  May repeat in 2 hours if needed.  Maximum 2 tablets in 24 hours. 10 tablet 5   traZODone (DESYREL) 150 MG tablet Take 1 tablet (150 mg total) by mouth at bedtime. 90 tablet 3   No current facility-administered medications for this visit.     ALLERGIES: Iodinated contrast media, Codeine, Imitrex [sumatriptan], and Morphine  Family History  Problem Relation Age of Onset   Hypertension Father    Cancer Father        colon, liver   Atrial fibrillation Father    Hypertension Sister    Hypertension Brother    Cancer Brother        lung, adrenal glands and brain   Hypertension Brother    Aneurysm Mother        brain   Stroke Mother 31    Social History   Socioeconomic History   Marital status: Widowed    Spouse name: Not on file   Number of children: Not on file   Years of education: Not on file   Highest education level: Not on file  Occupational History   Not on file  Tobacco Use   Smoking status: Former    Current packs/day: 0.00    Types: Cigarettes    Quit date:  37    Years since quitting: 32.7   Smokeless tobacco: Never   Tobacco comments:    quit smoking in 1992  Vaping Use   Vaping status: Never Used  Substance and Sexual Activity   Alcohol use: Yes    Alcohol/week: 0.0 standard drinks of alcohol    Comment: occ   Drug use: No   Sexual activity: Not Currently    Birth control/protection: Post-menopausal  Other Topics Concern   Not on file  Social History Narrative   Walks the dog exercise.  Has to stop breath while walking the dog.    Drinks about 3 caffeine drinks a week    Currently disabled after 8 back surgeries with spinal cord/injury   Right handed   Lives with husband one story home   Social Determinants of Health   Financial Resource Strain: Not on file  Food Insecurity: Not on file  Transportation Needs: Not on file  Physical Activity: Not on file  Stress: Not on file  Social Connections: Not on file  Intimate Partner Violence: Not on file    Review of Systems  PHYSICAL EXAMINATION:    LMP 06/24/2012     General appearance: alert, cooperative and appears stated age Head: Normocephalic, without obvious abnormality, atraumatic Neck: no adenopathy, supple, symmetrical, trachea midline and thyroid normal to inspection and palpation Lungs: clear to auscultation bilaterally Breasts: normal appearance, no masses or tenderness, No nipple retraction or dimpling, No nipple discharge or bleeding, No axillary or supraclavicular adenopathy Heart: regular rate and rhythm Abdomen: soft, non-tender, no masses,  no organomegaly Extremities: extremities normal, atraumatic, no cyanosis or edema Skin: Skin color, texture, turgor normal. No rashes or lesions Lymph nodes: Cervical, supraclavicular, and axillary nodes normal. No abnormal inguinal nodes palpated Neurologic: Grossly normal  Pelvic: External genitalia:  no lesions              Urethra:  normal appearing urethra with no masses, tenderness or lesions               Bartholins and Skenes: normal                 Vagina: normal appearing vagina with normal color and discharge, no lesions              Cervix: no lesions                Bimanual Exam:  Uterus:  normal size, contour, position, consistency, mobility, non-tender              Adnexa: no mass, fullness, tenderness              Rectal exam: {yes no:[id number]}.  Confirms.              Anus:  normal sphincter tone, no lesions  Chaperone was present for exam:  ***  ASSESSMENT     PLAN     An After Visit Summary was printed and given to the patient.  ______ minutes face to face time of which over 50% was spent in counseling.

## 2023-07-12 ENCOUNTER — Encounter: Payer: Self-pay | Admitting: Obstetrics and Gynecology

## 2023-07-12 ENCOUNTER — Ambulatory Visit (INDEPENDENT_AMBULATORY_CARE_PROVIDER_SITE_OTHER): Payer: Medicare HMO | Admitting: Obstetrics and Gynecology

## 2023-07-12 VITALS — BP 110/64 | HR 79 | Wt 177.4 lb

## 2023-07-12 DIAGNOSIS — A630 Anogenital (venereal) warts: Secondary | ICD-10-CM

## 2023-07-12 DIAGNOSIS — Z634 Disappearance and death of family member: Secondary | ICD-10-CM | POA: Diagnosis not present

## 2023-07-12 MED ORDER — IMIQUIMOD 5 % EX CREA: TOPICAL_CREAM | CUTANEOUS | 3 refills | Status: DC

## 2023-07-12 NOTE — Patient Instructions (Signed)
Imiquimod Cream What is this medication? IMIQUIMOD (i mi KWI mod) treats rough, scaly spots on the skin caused by sun exposure. It may also treat warts on and around the genital and rectal areas caused by a virus. It does not kill the virus and it may still be possible to spread the virus to others. It will not treat infections caused by bacteria. This medicine may be used for other purposes; ask your health care provider or pharmacist if you have questions. COMMON BRAND NAME(S): Aldara, Zyclara What should I tell my care team before I take this medication? They need to know if you have any of these conditions: Immune system problems Large areas of burned or damaged skin An unusual or allergic reaction to imiquimod, other medications, foods, dyes, or preservatives Pregnant or trying to get pregnant Breast-feeding How should I use this medication? This medication is for external use only. Do not take by mouth. Wash hands before and after use. Do not get it in your eyes. If you do, rinse your eyes with plenty of cool tap water. Use it as directed on the prescription label. Do not use it more often than directed. Use the medication for the full course as directed by your care team, even if you think you are better. Do not stop using it unless your care team tells you to stop it early. Apply a thin film of medication to the affected area. Talk to your care team about the use of this medication in children. While it may be prescribed for children as young as 12 years for selected conditions, precautions do apply. Overdosage: If you think you have taken too much of this medicine contact a poison control center or emergency room at once. NOTE: This medicine is only for you. Do not share this medicine with others. What if I miss a dose? If you miss a dose, use it as soon as you can. If it is almost time for your next dose, use only that dose. Do not use double or extra doses. What may interact with this  medication? Interactions are not expected. Do not use any other skin products on the same area of skin without talking to your care team. This list may not describe all possible interactions. Give your health care provider a list of all the medicines, herbs, non-prescription drugs, or dietary supplements you use. Also tell them if you smoke, drink alcohol, or use illegal drugs. Some items may interact with your medicine. What should I watch for while using this medication? Visit your care team for regular checks on your progress. Tell your care team if your symptoms do not start to get better or if they get worse. If you are being treated for a sexually transmitted infection (STI), avoid sexual contact until you have finished your treatment. Your partner may also need treatment. This medication can damage and reduce the effect of latex-containing products, such as condoms and diaphragms. Avoid contact of this medication with latex-containing products; throw away any products that are exposed to this medication. This medication can make you more sensitive to the sun. Keep out of the sun. If you cannot avoid being in the sun, wear protective clothing and sunscreen. Do not use sun lamps or tanning beds/booths. What side effects may I notice from receiving this medication? Side effects that you should report to your care team as soon as possible: Allergic reactions--skin rash, itching, hives, swelling of the face, lips, tongue, or throat Burning, itching, crusting,  or peeling of treated skin Flu-like symptoms--fever, chills, muscle pain, cough, headache, fatigue Vaginal irritation at application site Painful swelling, warmth, or redness of the skin, blisters or sores Side effects that usually do not require medical attention (report to your care team if they continue or are bothersome): Change in skin color Mild skin irritation, redness, or dryness This list may not describe all possible side effects.  Call your doctor for medical advice about side effects. You may report side effects to FDA at 1-800-FDA-1088. Where should I keep my medication? Keep out of the reach of children and pets. See product for storage information. Each product may have different instructions. Get rid of any unused medication after the expiration date. To get rid of medications that are no longer needed or have expired: Take the medication to a medication take-back program. Check with your pharmacy or law enforcement to find a location. If you cannot return the medication, check the label or package insert to see if the medication should be thrown out in the garbage or flushed down the toilet. If you are not sure, ask your care team. If it is safe to put it in the trash, empty the medication out of the container. Mix the medication with cat litter, dirt, coffee grounds, or other unwanted substance. Seal the mixture in a bag or container. Put it in the trash. NOTE: This sheet is a summary. It may not cover all possible information. If you have questions about this medicine, talk to your doctor, pharmacist, or health care provider.  2024 Elsevier/Gold Standard (2022-01-26 00:00:00)

## 2023-08-01 ENCOUNTER — Other Ambulatory Visit: Payer: Self-pay | Admitting: Internal Medicine

## 2023-08-05 ENCOUNTER — Ambulatory Visit (INDEPENDENT_AMBULATORY_CARE_PROVIDER_SITE_OTHER): Payer: Medicare HMO | Admitting: Radiology

## 2023-08-05 VITALS — BP 122/72

## 2023-08-05 DIAGNOSIS — L309 Dermatitis, unspecified: Secondary | ICD-10-CM

## 2023-08-05 DIAGNOSIS — R1032 Left lower quadrant pain: Secondary | ICD-10-CM

## 2023-08-05 NOTE — Progress Notes (Signed)
[name] 06-26-1956 [id number]   History: Postmenopausal 67 y.o. presents to discuss results of CT scan for LLQ pain completed 06/2023. Also has some red raised bumps she can feel on her buttock. Previously treated with aldara for genital warts, reports no new warts she has noticed.   Gynecologic History Postmenopausal   Obstetric History OB History  Gravida Para Term Preterm AB Living  1 1 0 0 0 1  SAB IAB Ectopic Multiple Live Births  0 0 0        # Outcome Date GA Lbr Len/2nd Weight Sex Type Anes PTL Lv  1 Para            Narrative & Impression  CLINICAL DATA:  Left lower quadrant pain for 6 months.   EXAM: CT ABDOMEN AND PELVIS WITH CONTRAST   TECHNIQUE: Multidetector CT imaging of the abdomen and pelvis was performed using the standard protocol following bolus administration of intravenous contrast.   RADIATION DOSE REDUCTION: This exam was performed according to the departmental dose-optimization program which includes automated exposure control, adjustment of the mA and/or kV according to patient size and/or use of iterative reconstruction technique.   CONTRAST:  75mL ISOVUE-370 IOPAMIDOL (ISOVUE-370) INJECTION 76%   COMPARISON:  12/24/2017   FINDINGS: Lower Chest: No acute findings.   Hepatobiliary: No suspicious hepatic masses identified. Probable tiny sub-cm cyst seen in the posterior right hepatic lobe. Prior cholecystectomy. No evidence of biliary obstruction.   Pancreas:  No mass or inflammatory changes.   Spleen: Within normal limits in size and appearance.   Adrenals/Urinary Tract: No suspicious masses identified. No evidence of ureteral calculi or hydronephrosis.Unremarkable unopacified urinary bladder.   Stomach/Bowel: No evidence of obstruction, inflammatory process or abnormal fluid collections. Normal appendix visualized. Large colonic stool burden noted.   Vascular/Lymphatic: No pathologically enlarged lymph nodes. No  acute vascular findings.   Reproductive:  No mass or other significant abnormality.   Other:  None.   Musculoskeletal: No suspicious bone lesions identified. Interbody ray cages is seen at L5-S1. Neurostimulator noted with leads in the thoracic spinal canal.   IMPRESSION: No acute findings within the abdomen or pelvis.   Large stool burden noted; recommend clinical correlation for possible constipation.     Electronically Signed   By: Danae Orleans M.D.   On: 06/23/2023 08:54     The following portions of the patient's history were reviewed and updated as appropriate: allergies, current medications, past family history, past medical history, past social history, past surgical history, and problem list.  Review of Systems Pertinent items noted in HPI and remainder of comprehensive ROS otherwise negative.  Past medical history, past surgical history, family history and social history were all reviewed and documented in the EPIC chart.  Exam:  Vitals:   08/05/23 1452  BP: 122/72   There is no height or weight on file to calculate BMI.  Physical Exam Vitals and nursing note reviewed. Exam conducted with a chaperone present.  Constitutional:      Appearance: Normal appearance. She is normal weight.  Pulmonary:     Effort: Pulmonary effort is normal.  Genitourinary:   Neurological:     Mental Status: She is alert.  Psychiatric:        Mood and Affect: Mood normal.        Thought Content: Thought content normal.      Raynelle Fanning, CMA present for exam  Assessment/Plan:   1. LLQ pain Reassured normal CT, likely constipation causing  symptoms  2. Dermatitis Reassured no warts seen on exam, appears to be mild dermatitis     Rashel Okeefe B WHNP-BC, 3:05 PM 08/05/2023

## 2023-08-22 ENCOUNTER — Telehealth: Payer: Self-pay | Admitting: Neurology

## 2023-08-22 MED ORDER — RIZATRIPTAN BENZOATE 10 MG PO TABS: ORAL_TABLET | ORAL | 5 refills | Status: DC

## 2023-08-22 NOTE — Telephone Encounter (Signed)
1. Which medications need to be refilled? (please list name of each medication and dose if known) rizatriptan (MAXALT) 10 MG tablet [[id number]]   2. Which pharmacy/location (including street and city if local pharmacy) is medication to be sent to? [facility] [address], [place], [place] [address]  3. Do they need a 30 day or 90 day supply?

## 2023-09-28 DIAGNOSIS — M25551 Pain in right hip: Secondary | ICD-10-CM | POA: Diagnosis not present

## 2023-10-05 DIAGNOSIS — E039 Hypothyroidism, unspecified: Secondary | ICD-10-CM | POA: Diagnosis not present

## 2023-10-14 NOTE — Progress Notes (Signed)
 GYNECOLOGY  VISIT   HPI: 67 y.o.   Widowed  Caucasian female   G1P0001 with Patient's last menstrual period was 06/24/2012.   here for: recheck of condyloma. Does want to discuss recent hot flashes.    States one of the condyloma is now gone.  Not using Aldara  anymore.  No hot flashes for 3 - 4 years until recently.  Normal thyroid  function, checked in December.   Now off Lexapro  since the end of November, and doing OK.  Patient is asking about results of her pelvic US  05/30/23.  Normal uterus and nabothian cysts seen.  Ovaries not seen.  Her CT scan 06/21/23 showed large stool burden.   She states she has chronic constipation and is working on dietary changes to improve this by increasing her fiber and water intake.   She is asking about scheduling a bone density.  Her last BMD 03/01/13 showed T score -1.1 of her spine.   Having a knee replacement in April, 2025.   GYNECOLOGIC HISTORY: Patient's last menstrual period was 06/24/2012. Contraception:  PMP Menopausal hormone therapy:  n/a Last 2 paps:  05/23/23 neg: HR HPV neg, 02/11/22 neg: HR HPV neg  History of abnormal Pap or positive HPV:  yes Mammogram:  03/25/23 Breast density Cat B, BI-RADS CAT 1 neg         OB History     Gravida  1   Para  1   Term  0   Preterm  0   AB  0   Living  1      SAB  0   IAB  0   Ectopic  0   Multiple      Live Births                 Patient Active Problem List   Diagnosis Date Noted   Routine general medical examination at a health care facility 02/14/2023   Right hip pain 02/14/2023   Hyperlipidemia 08/27/2022   GERD (gastroesophageal reflux disease) 08/27/2022   Pain in right hand 05/07/2020   Lumbar facet joint syndrome 11/15/2019   Lumbar spondylosis 11/15/2019   Pain in left knee 06/07/2017   Snoring 09/27/2016   Other chronic pain 09/27/2016   CKD (chronic kidney disease), stage III (HCC) 09/27/2016   Failed back syndrome, lumbar 09/27/2016   Positive  for microalbuminuria 09/27/2016   Essential hypertension 09/27/2016   Vaginal atrophy 02/18/2014   Endometriosis    Chronic hepatitis C (HCC) 09/15/2011   DEPRESSION/ANXIETY 01/24/2008   CONSTIPATION, CHRONIC 04/27/2007   INSOMNIA, PERSISTENT 04/06/2007   Postoperative hypothyroidism 02/07/2007   OSTEOPENIA 02/07/2007    Past Medical History:  Diagnosis Date   Arthritis    DDD (degenerative disc disease), lumbar    Diabetes mellitus 1-13   patient denies and says never made aware;  BORDERLINE DR. BALAN-diet controlled;    GERD (gastroesophageal reflux disease)    Hepatitis    genotype 1b hepatitis C virus, monitored by [facility] infectious disease group   History of bronchitis    Hyperlipidemia    Hypertension    Spinal cord stimulator status    placed 2006   Thyroid  disease    takes synthroid     Past Surgical History:  Procedure Laterality Date   Anterior cervical decompression and fusion with a reflex hyper plate  89/12/7991   C4-C5 and C5-C6 , Dr. CHRISTELLA. Roy   BREAST EXCISIONAL BIOPSY Left 1982   BREAST SURGERY  1983  left breast cyst removed   cervical fusion w/OASIS system infuse  05/13/2006   C4-C5 and C5-C6 and C6-7,  Dr. emerson Carwin   CHOLECYSTECTOMY  ?2002   CHOLECYSTECTOMY  2002   COLONOSCOPY     ESOPHAGEAL DILATION     Dr. Dyane   KNEE SURGERY  06/2017   LAPAROSCOPIC ENDOMETRIOSIS FULGURATION  1980's   Dr. Christophe   LIVER BIOPSY  2002   LOWER BACK SURGERY  OCT 2016   LUMBAR DISC SURGERY  08/13/2003   Diskectomy/fusion L5-S1 rt., Dr. EMERSON Carwin   lumbar hemilaminectomy  01/1989   L5-S1 on right, Dr. Candance   NASAL SINUS SURGERY  10/30/2007   Left upper Sinus lift,  Dr. CANDIE Lloyd   SHOULDER ARTHROSCOPY WITH SUBACROMIAL DECOMPRESSION Right 12/06/2014   Procedure: SHOULDER ARTHROSCOPY WITH SUBACROMIAL DECOMPRESSION; EXCISION BONE SPUR;  Surgeon: Elsie Mussel, MD;  Location: [facility];  Service: Orthopedics;  Laterality: Right;   SHOULDER SURGERY Right 12/06/14    cleaned up bone spurs   SHOULDER SURGERY Right FEB 2016   SPINAL CORD STIMULATOR INSERTION  12/29/2004   leads placed along spine from T8 to T10, Dr. CHRISTELLA. Roy   Spinal cord stimulator pulse generator  01/05/2005   placed in right upper buttocks, Dr. CHRISTELLA. Roy   THYROID  LOBECTOMY Right 02/13/2016   THYROIDECTOMY Right 02/13/2016   Procedure: RIGHT THYROID  LOBECTOMY WITH FROZEN SECTION ;  Surgeon: Ida Loader, MD;  Location: [facility];  Service: ENT;  Laterality: Right;   TONSILLECTOMY      Current Outpatient Medications  Medication Sig Dispense Refill   estradiol  (ESTRACE ) 0.1 MG/GM vaginal cream Use 1/2 g vaginally every night for the first 2 weeks, then use 1/2 g vaginally two or three times per week as needed to maintain symptom relief. 42.5 g 1   fluticasone  (FLONASE ) 50 MCG/ACT nasal spray Place 2 sprays into the nose daily as needed for allergies.      imiquimod  (ALDARA ) 5 % cream Apply topically 3 (three) times a week. Apply entire packet of cream to affected areas 3 times weekly.  Apply at night and leave on during sleeping hours.  Wash off completely after 8 hours. 12 each 3   lamoTRIgine  (LAMICTAL ) 100 MG tablet Take 200 mg by mouth daily.     levothyroxine  (SYNTHROID ) 112 MCG tablet Take 125 mcg by mouth 6 (six) times daily. 100 mg daily     lisinopril -hydrochlorothiazide  (ZESTORETIC ) 20-12.5 MG tablet Take 0.5 tablets by mouth every morning. 90 tablet 1   pantoprazole  (PROTONIX ) 40 MG tablet Take 1 tablet (40 mg total) by mouth daily. Annual appt is due must see provider for future refills 90 tablet 3   pravastatin  (PRAVACHOL ) 20 MG tablet Take 1 tablet (20 mg total) by mouth daily. 90 tablet 3   rizatriptan  (MAXALT ) 10 MG tablet Take 1 tablet earliest onset of migraine.  May repeat in 2 hours if needed.  Maximum 2 tablets in 24 hours. 10 tablet 5   traZODone  (DESYREL ) 150 MG tablet TAKE 1 TABLET AT BEDTIME 90 tablet 3   No current facility-administered medications for this visit.      ALLERGIES: Iodinated contrast media, Codeine, Imitrex  [sumatriptan ], and Morphine   Family History  Problem Relation Age of Onset   Hypertension Father    Cancer Father        colon, liver   Atrial fibrillation Father    Hypertension Sister    Hypertension Brother    Cancer Brother  lung, adrenal glands and brain   Hypertension Brother    Aneurysm Mother        brain   Stroke Mother 72    Social History   Socioeconomic History   Marital status: Widowed    Spouse name: Not on file   Number of children: Not on file   Years of education: Not on file   Highest education level: Not on file  Occupational History   Not on file  Tobacco Use   Smoking status: Former    Current packs/day: 0.00    Types: Cigarettes    Quit date: 78    Years since quitting: 33.0   Smokeless tobacco: Never   Tobacco comments:    quit smoking in 1992  Vaping Use   Vaping status: Never Used  Substance and Sexual Activity   Alcohol  use: Yes    Alcohol /week: 0.0 standard drinks of alcohol     Comment: occ   Drug use: No   Sexual activity: Not Currently    Birth control/protection: Post-menopausal  Other Topics Concern   Not on file  Social History Narrative   Walks the dog exercise.  Has to stop breath while walking the dog.    Drinks about 3 caffeine drinks a week    Currently disabled after 8 back surgeries with spinal cord/injury   Right handed   Lives with husband one story home   Social Drivers of Health   Financial Resource Strain: Not on file  Food Insecurity: Not on file  Transportation Needs: Not on file  Physical Activity: Not on file  Stress: Not on file  Social Connections: Not on file  Intimate Partner Violence: Not on file    Review of Systems  All other systems reviewed and are negative.   PHYSICAL EXAMINATION:   BP 116/62 (BP Location: Right Arm, Patient Position: Sitting, Cuff Size: Small)   Pulse 76   Ht 5' 6 (1.676 m)   Wt 176 lb (79.8 kg)   LMP  06/24/2012   SpO2 98%   BMI 28.41 kg/m     General appearance: alert, cooperative and appears stated age  Pelvic: External genitalia:  very subtle slightly raised epithelium of the perineum left side greater than right side.  No obvious condyloma.               Urethra:  normal appearing urethra with no masses, tenderness or lesions              Bartholins and Skenes: normal                 Vagina: normal appearing vagina with normal color and discharge, no lesions.  Atrophy noted.               Cervix: no lesions                Bimanual Exam:  Uterus:  normal size, contour, position, consistency, mobility, non-tender              Adnexa: no mass, fullness, tenderness             Chaperone was present for exam:  Damien FALCON, CMA  ASSESSMENT:  Vulvar condyloma.  Treated with Aldara .  Constipation.  Osteopenia of spine.  Menopausal female.  Hot flashes.  Uncertain etiology.  I do not expect this is due to menopausal change and would look to other etiologies.   PLAN:  No further Aldara  needed at this time.  Pelvic US   and CT results reviewed.  [facility]  Patient will see her PCP regarding her sensation of increased heat. Follow up for breast and pelvic exam in July, 2025 after recovering from hip surgery.   35 min  total time was spent for this patient encounter, including preparation, face-to-face counseling with the patient, coordination of care, and documentation of the encounter.

## 2023-10-19 DIAGNOSIS — M810 Age-related osteoporosis without current pathological fracture: Secondary | ICD-10-CM

## 2023-10-19 HISTORY — DX: Age-related osteoporosis without current pathological fracture: M81.0

## 2023-10-21 ENCOUNTER — Telehealth: Payer: Self-pay | Admitting: Internal Medicine

## 2023-10-21 NOTE — Telephone Encounter (Signed)
 We have received surgical clearance forms from [employer] and they have been placed in the providers box.  Please fax to: [phone]

## 2023-10-22 DIAGNOSIS — M25511 Pain in right shoulder: Secondary | ICD-10-CM | POA: Diagnosis not present

## 2023-10-24 NOTE — Telephone Encounter (Signed)
Needs visit please schedule 

## 2023-10-24 NOTE — Telephone Encounter (Signed)
 Placed inside providers office box

## 2023-10-25 ENCOUNTER — Ambulatory Visit: Payer: Medicare HMO | Admitting: Obstetrics and Gynecology

## 2023-10-25 ENCOUNTER — Encounter: Payer: Self-pay | Admitting: Obstetrics and Gynecology

## 2023-10-25 VITALS — BP 116/62 | HR 76 | Ht 66.0 in | Wt 176.0 lb

## 2023-10-25 DIAGNOSIS — R232 Flushing: Secondary | ICD-10-CM

## 2023-10-25 DIAGNOSIS — M949 Disorder of cartilage, unspecified: Secondary | ICD-10-CM | POA: Diagnosis not present

## 2023-10-25 DIAGNOSIS — A630 Anogenital (venereal) warts: Secondary | ICD-10-CM | POA: Diagnosis not present

## 2023-10-25 DIAGNOSIS — Z780 Asymptomatic menopausal state: Secondary | ICD-10-CM

## 2023-10-25 DIAGNOSIS — M899 Disorder of bone, unspecified: Secondary | ICD-10-CM | POA: Diagnosis not present

## 2023-10-31 DIAGNOSIS — M7918 Myalgia, other site: Secondary | ICD-10-CM | POA: Diagnosis not present

## 2023-10-31 DIAGNOSIS — M542 Cervicalgia: Secondary | ICD-10-CM | POA: Diagnosis not present

## 2023-11-04 ENCOUNTER — Ambulatory Visit
Admission: RE | Admit: 2023-11-04 | Discharge: 2023-11-04 | Disposition: A | Discharge status: Home / Self Care | Payer: Medicare HMO | Source: Ambulatory Visit | Attending: Obstetrics and Gynecology | Admitting: Obstetrics and Gynecology

## 2023-11-04 DIAGNOSIS — Z780 Asymptomatic menopausal state: Secondary | ICD-10-CM

## 2023-11-04 DIAGNOSIS — M899 Disorder of bone, unspecified: Secondary | ICD-10-CM

## 2023-11-04 DIAGNOSIS — E2839 Other primary ovarian failure: Secondary | ICD-10-CM | POA: Diagnosis not present

## 2023-11-04 DIAGNOSIS — M8588 Other specified disorders of bone density and structure, other site: Secondary | ICD-10-CM | POA: Diagnosis not present

## 2023-11-04 DIAGNOSIS — N958 Other specified menopausal and perimenopausal disorders: Secondary | ICD-10-CM | POA: Diagnosis not present

## 2023-11-05 ENCOUNTER — Encounter: Payer: Self-pay | Admitting: Obstetrics and Gynecology

## 2023-11-07 NOTE — Progress Notes (Unsigned)
NEUROLOGY FOLLOW UP OFFICE NOTE  [name] [id number]  Assessment/Plan:   Migraine without aura, without status migrainosus, not intractable, overall stable. Tension-type headache, not intractable - aggravated by stress, now improving   1.  Migraine prevention:  Medication not indicated  2.  Migraine rescue:  Rizatriptan 10mg   3.  Limit use of pain relievers to no more than 2 days out of week to prevent risk of rebound or medication-overuse headache. 4.  Keep headache diary 5.  Follow up 1 year    Subjective:  [name] is a 68 year old right-handed female with HTN, degenerative disc disease of lumbar spine, s/p spinal cord stimulator, and diabetes who follows up for migraines.   UPDATE: Intensity:  Moderate to severe Duration:  Usually 15-20 minutes.  Postdrome fatigue for a day Frequency:  no more than 2 last year. Last couple of weeks - on prednisone for shoulder and had headaches - stopped - stress - hip replacement  Wakes up in morning with them.  During the day slowly goes away - rizatriptan helps, not motrin or aleve  1-2 a week.     Current NSAIDS:  none Current analgesics:  acetaminophen, tramadol  Current triptans:  Rizatriptan 10mg  (effective but causes drowsiness) Current ergotamine:  none Current anti-emetic:  none Current muscle relaxants:  Flexeril Current anti-anxiolytic:  none Current sleep aide:  none Current Antihypertensive medications:  Lisinopril-HCTZ Current Antidepressant medications:  Pristiq, trazodone Current Anticonvulsant medications:  Gabapentin 300mg  TID Current anti-CGRP:  none Current Vitamins/Herbal/Supplements:  none Current Antihistamines/Decongestants:  none Other therapy:  none Hormone/birth control:  none  Other medications:  none   Caffeine:  1 cup decaf coffee, 1 cup decaf tea.  No soda Diet:  Drinks mostly water.  Eats two meals a day.  Sometimes may snack Exercise:  no Depression:  yes; Anxiety:  yes.  [name]  husband passed away last month due to complications of interstitial pulmonary fibrosis.  She has had a difficult month. Other pain:  Chronic back pain Sleep hygiene:  Takes a couple of hours to fall asleep.  Gets up a couple of times to go to the bathroom   HISTORY:  In April 2021, she was hospitalized and diagnosed with transient global amnesia.  She kept repeating questions and couldn't remember having shoulder surgery the prior month.  She didn't remember her activities for that day.  CT of head was unremarkable (unable to have MRI due to spinal cord stimulator).  EEG was normal.     A couple of months later, she developed vertigo.  When she would walk, she felt like her environment was moving.  If she was in bed, it felt like her bed was turning.  She was coming out of the bathroom and fell.  She did not hit her head.  If she was bent over and came back up, she would feel dizzy, lasting 4 to 6 minutes.  She was on meclizine.  This was ongoing for a couple of months.  Dizziness overall improved, but may have a moment of dizziness for a second when she got up.  She reports prior history of vertigo a couple of years ago, which she attributes to receiving both the flu shot and shingles shot on the same day. She was in the bed for a week before it resolved but nothing as severe as this recent event.     She started having migraines in 1980, after the birth of her daughter.  They are severe and throbbing, either mid-frontal to top of head, back of head or temples.  Associated with nausea, photophobia, phonophobia.  It lasts 30 minutes with rizatriptan and occurs about 7 migraines in 30 days.       Past NSAIDS:  Ibuprofen, naproxen Past analgesics:  Tylenol Past abortive triptans:  Sumatriptan [place] (hyperventilating, near syncope) Past abortive ergotamine:  none Past muscle relaxants:  none Past anti-emetic:  none Past antihypertensive medications:  none Past antidepressant medications:  amitriptyline,  Cymbalta, sertraline Past anticonvulsant medications:  topiramate Past anti-CGRP:  Aimovig expensive Past vitamins/Herbal/Supplements:  none Past antihistamines/decongestants: meclizine Other past therapies:  none     Family history of headache:  Mother (brain aneurysm in 1965), brother (migraines), daughter (migraines), granddaughter (migraines)  PAST MEDICAL HISTORY: Past Medical History:  Diagnosis Date   Arthritis    DDD (degenerative disc disease), lumbar    Diabetes mellitus 10/2011   patient denies and says never made aware;  BORDERLINE DR. BALAN-diet controlled;    GERD (gastroesophageal reflux disease)    Hepatitis    genotype 1b hepatitis C virus, monitored by [facility] infectious disease group   History of bronchitis    Hyperlipidemia    Hypertension    Osteoporosis 2025   forearm   Spinal cord stimulator status    placed 2006   Thyroid disease    takes synthroid    MEDICATIONS: Current Outpatient Medications on File Prior to Visit  Medication Sig Dispense Refill   estradiol (ESTRACE) 0.1 MG/GM vaginal cream Use 1/2 g vaginally every night for the first 2 weeks, then use 1/2 g vaginally two or three times per week as needed to maintain symptom relief. 42.5 g 1   fluticasone (FLONASE) 50 MCG/ACT nasal spray Place 2 sprays into the nose daily as needed for allergies.      imiquimod (ALDARA) 5 % cream Apply topically 3 (three) times a week. Apply entire packet of cream to affected areas 3 times weekly.  Apply at night and leave on during sleeping hours.  Wash off completely after 8 hours. 12 each 3   lamoTRIgine (LAMICTAL) 100 MG tablet Take 200 mg by mouth daily.     levothyroxine (SYNTHROID) 112 MCG tablet Take 125 mcg by mouth 6 (six) times daily. 100 mg daily     lisinopril-hydrochlorothiazide (ZESTORETIC) 20-12.5 MG tablet Take 0.5 tablets by mouth every morning. 90 tablet 1   pantoprazole (PROTONIX) 40 MG tablet Take 1 tablet (40 mg total) by mouth daily. Annual appt  is due must see provider for future refills 90 tablet 3   pravastatin (PRAVACHOL) 20 MG tablet Take 1 tablet (20 mg total) by mouth daily. 90 tablet 3   rizatriptan (MAXALT) 10 MG tablet Take 1 tablet earliest onset of migraine.  May repeat in 2 hours if needed.  Maximum 2 tablets in 24 hours. 10 tablet 5   traZODone (DESYREL) 150 MG tablet TAKE 1 TABLET AT BEDTIME 90 tablet 3   No current facility-administered medications on file prior to visit.    ALLERGIES: Allergies  Allergen Reactions   Iodinated Contrast Media Itching and Cough    Patient experienced itching of ears, coughing and scratchy throat after contrast injection.  Dr.Grady had patient observed for 10 minutes, patient discharged with symptoms resolved and no medicine administered.Dr Mosetta Putt instructed premeds prior to any future contrast study    Codeine Nausea Only   Imitrex [Sumatriptan] Hives    "get hot and hyperventilate"   Morphine Rash  FAMILY HISTORY: Family History  Problem Relation Age of Onset   Hypertension Father    Cancer Father        colon, liver   Atrial fibrillation Father    Hypertension Sister    Hypertension Brother    Cancer Brother        lung, adrenal glands and brain   Hypertension Brother    Aneurysm Mother        brain   Stroke Mother 71      Objective:  Blood pressure 110/72, pulse 76, height 5\' 5"  (1.651 m), weight 178 lb 3.2 oz (80.8 kg), last menstrual period 06/24/2012, SpO2 96%. General: No acute distress.  Patient appears well-groomed.   Head:  Normocephalic/atraumatic Eyes:  Fundi examined but not visualized Neck: supple, no paraspinal tenderness, full range of motion Heart:  Regular rate and rhythm Neurological Exam: alert and oriented.  Speech fluent and not dysarthric, language intact.  CN II-XII intact. Bulk and tone normal, muscle strength 5/5 throughout.  Sensation to light touch intact.  Deep tendon reflexes 2+ throughout.  Finger to nose testing intact.  Gait normal,  Romberg negative.   Shon Millet, DO  CC: Hillard Danker, MD

## 2023-11-08 ENCOUNTER — Ambulatory Visit: Payer: Medicare HMO | Admitting: Neurology

## 2023-11-08 ENCOUNTER — Encounter: Payer: Self-pay | Admitting: Neurology

## 2023-11-08 VITALS — BP 110/72 | HR 76 | Ht 65.0 in | Wt 178.2 lb

## 2023-11-08 DIAGNOSIS — G43009 Migraine without aura, not intractable, without status migrainosus: Secondary | ICD-10-CM | POA: Diagnosis not present

## 2023-11-08 DIAGNOSIS — G44219 Episodic tension-type headache, not intractable: Secondary | ICD-10-CM | POA: Diagnosis not present

## 2023-11-11 ENCOUNTER — Encounter: Payer: Self-pay | Admitting: Internal Medicine

## 2023-11-11 ENCOUNTER — Ambulatory Visit (INDEPENDENT_AMBULATORY_CARE_PROVIDER_SITE_OTHER): Payer: Medicare HMO | Admitting: Internal Medicine

## 2023-11-11 VITALS — BP 128/84 | HR 70 | Temp 97.7°F | Wt 179.0 lb

## 2023-11-11 DIAGNOSIS — Z01810 Encounter for preprocedural cardiovascular examination: Secondary | ICD-10-CM | POA: Diagnosis not present

## 2023-11-11 NOTE — Assessment & Plan Note (Signed)
EKG done and stable from prior. BP at goal. No known diabetes. Non-smoker. She is able to exert adequate mets and should proceed with surgery without further testing low risk. Forms filled out for surgeon. Advised given her chronic constipation she will need to be aggressive regarding bowel regimen post-operatively.

## 2023-11-11 NOTE — Progress Notes (Signed)
   Subjective:   Patient ID: [name], female    DOB: 1955-10-31, 68 y.o.   MRN: [id number]  HPI The patient is a 68 YO female coming in for pre-op for planned right total hip. Denies SOB or chest pains. Is able to walk up flight of stairs but walking is limited due to pain in the hip  Review of Systems  Constitutional: Negative.   HENT: Negative.    Eyes: Negative.   Respiratory:  Negative for cough, chest tightness and shortness of breath.   Cardiovascular:  Negative for chest pain, palpitations and leg swelling.  Gastrointestinal:  Positive for constipation. Negative for abdominal distention, abdominal pain, diarrhea, nausea and vomiting.  Musculoskeletal:  Positive for arthralgias.  Skin: Negative.   Neurological: Negative.   Psychiatric/Behavioral: Negative.      Objective:  Physical Exam Constitutional:      Appearance: She is well-developed.  HENT:     Head: Normocephalic and atraumatic.  Cardiovascular:     Rate and Rhythm: Normal rate and regular rhythm.  Pulmonary:     Effort: Pulmonary effort is normal. No respiratory distress.     Breath sounds: Normal breath sounds. No wheezing or rales.  Abdominal:     General: Bowel sounds are normal. There is no distension.     Palpations: Abdomen is soft.     Tenderness: There is no abdominal tenderness. There is no rebound.  Musculoskeletal:     Cervical back: Normal range of motion.  Skin:    General: Skin is warm and dry.  Neurological:     Mental Status: She is alert and oriented to person, place, and time.     Coordination: Coordination normal.     Vitals:   11/11/23 1045  BP: 128/84  Pulse: 70  Temp: 97.7 F (36.5 C)  TempSrc: Oral  SpO2: 98%  Weight: 179 lb (81.2 kg)   EKG: Rate 66, axis normal, interval normal, sinus, no st or t wave changes, no significant change compared to prior 2021   Assessment & Plan:  Visit time 20 minutes in face to face communication with patient and coordination of  care, additional 10 minutes spent in record review, coordination or care, ordering tests, communicating/referring to other healthcare professionals, documenting in medical records all on the same day of the visit for total time 30 minutes spent on the visit.

## 2023-11-24 DIAGNOSIS — F410 Panic disorder [episodic paroxysmal anxiety] without agoraphobia: Secondary | ICD-10-CM | POA: Diagnosis not present

## 2023-11-24 DIAGNOSIS — F331 Major depressive disorder, recurrent, moderate: Secondary | ICD-10-CM | POA: Diagnosis not present

## 2023-11-28 DIAGNOSIS — H18413 Arcus senilis, bilateral: Secondary | ICD-10-CM | POA: Diagnosis not present

## 2023-11-28 DIAGNOSIS — H2511 Age-related nuclear cataract, right eye: Secondary | ICD-10-CM | POA: Diagnosis not present

## 2023-11-28 DIAGNOSIS — H2513 Age-related nuclear cataract, bilateral: Secondary | ICD-10-CM | POA: Diagnosis not present

## 2023-11-28 DIAGNOSIS — H25043 Posterior subcapsular polar age-related cataract, bilateral: Secondary | ICD-10-CM | POA: Diagnosis not present

## 2023-11-28 DIAGNOSIS — I10 Essential (primary) hypertension: Secondary | ICD-10-CM | POA: Diagnosis not present

## 2023-12-02 DIAGNOSIS — M1611 Unilateral primary osteoarthritis, right hip: Secondary | ICD-10-CM | POA: Diagnosis not present

## 2023-12-07 ENCOUNTER — Other Ambulatory Visit: Payer: Self-pay | Admitting: Internal Medicine

## 2023-12-07 NOTE — Telephone Encounter (Signed)
Copied from CRM [id number]. Topic: Clinical - Medication Refill >> Dec 07, 2023  9:53 AM Corin V wrote: Most Recent Primary Care Visit:  Provider: Hillard Danker A  Department: [facility]  Visit Type: OFFICE VISIT  Date: 11/11/2023  Medication: fluticasone (FLONASE) 50 MCG/ACT nasal spray  Has the patient contacted their pharmacy? Yes (Agent: If no, request that the patient contact the pharmacy for the refill. If patient does not wish to contact the pharmacy document the reason why and proceed with request.) (Agent: If yes, when and what did the pharmacy advise?)  Is this the correct pharmacy for this prescription? Yes If no, delete pharmacy and type the correct one.  This is the patient's preferred pharmacy:  [facility] - [place], [place] - [address] [place] [address] Phone: [phone] Fax: [phone]   Has the prescription been filled recently? No  Is the patient out of the medication? No  Has the patient been seen for an appointment in the last year OR does the patient have an upcoming appointment? Yes  Can we respond through MyChart? Yes  Agent: Please be advised that Rx refills may take up to 3 business days. We ask that you follow-up with your pharmacy.

## 2023-12-21 ENCOUNTER — Other Ambulatory Visit: Payer: Self-pay | Admitting: Internal Medicine

## 2023-12-23 DIAGNOSIS — Z860100 Personal history of colon polyps, unspecified: Secondary | ICD-10-CM | POA: Diagnosis not present

## 2023-12-23 DIAGNOSIS — R194 Change in bowel habit: Secondary | ICD-10-CM | POA: Diagnosis not present

## 2023-12-27 NOTE — H&P (Signed)
 TOTAL HIP ADMISSION H&P  Patient is admitted for right total hip arthroplasty.  Subjective:  Chief Complaint: Right hip pain  HPI: [name], 68 y.o. female, has a history of pain and functional disability in the right hip due to arthritis and patient has failed non-surgical conservative treatments for greater than 12 weeks to include NSAID's and/or analgesics and weight reduction as appropriate. Onset of symptoms was abrupt, starting several years ago with gradually worsening course since that time. The patient noted no past surgery on the right hip. Patient currently rates pain in the right hip at 8 out of 10 with activity. Patient has pain that interfers with activities of daily living. Patient has evidence of subchondral sclerosis and joint space narrowing by imaging studies. This condition presents safety issues increasing the risk of falls. There is no current active infection.  Patient Active Problem List   Diagnosis Date Noted   Pre-operative cardiovascular examination 11/11/2023   Routine general medical examination at a health care facility 02/14/2023   Right hip pain 02/14/2023   Hyperlipidemia 08/27/2022   GERD (gastroesophageal reflux disease) 08/27/2022   Pain in right hand 05/07/2020   Lumbar facet joint syndrome 11/15/2019   Lumbar spondylosis 11/15/2019   Pain in left knee 06/07/2017   Snoring 09/27/2016   Other chronic pain 09/27/2016   CKD (chronic kidney disease), stage III (HCC) 09/27/2016   Failed back syndrome, lumbar 09/27/2016   Positive for microalbuminuria 09/27/2016   Essential hypertension 09/27/2016   Vaginal atrophy 02/18/2014   Endometriosis    Chronic hepatitis C (HCC) 09/15/2011   DEPRESSION/ANXIETY 01/24/2008   CONSTIPATION, CHRONIC 04/27/2007   INSOMNIA, PERSISTENT 04/06/2007   Postoperative hypothyroidism 02/07/2007   OSTEOPENIA 02/07/2007    Past Medical History:  Diagnosis Date   Arthritis    DDD (degenerative disc disease), lumbar     Diabetes mellitus 10/2011   patient denies and says never made aware;  BORDERLINE DR. BALAN-diet controlled;    GERD (gastroesophageal reflux disease)    Hepatitis    genotype 1b hepatitis C virus, monitored by [facility] infectious disease group   History of bronchitis    Hyperlipidemia    Hypertension    Osteoporosis 2025   forearm   Spinal cord stimulator status    placed 2006   Thyroid disease    takes synthroid    Past Surgical History:  Procedure Laterality Date   Anterior cervical decompression and fusion with a reflex hyper plate  16/10/958   C4-C5 and C5-C6 , Dr. Judie Petit. [place]   BREAST EXCISIONAL BIOPSY Left 1982   BREAST SURGERY  1983   left breast cyst removed   cervical fusion w/OASIS system infuse  05/13/2006   C4-C5 and C5-C6 and C6-7,  Dr. Marnee Spring   CHOLECYSTECTOMY  ?2002   CHOLECYSTECTOMY  2002   COLONOSCOPY     ESOPHAGEAL DILATION     Dr. Madilyn Fireman   KNEE SURGERY  06/2017   LAPAROSCOPIC ENDOMETRIOSIS FULGURATION  1980's   Dr. Randell Patient   LIVER BIOPSY  2002   LOWER BACK SURGERY  OCT 2016   LUMBAR DISC SURGERY  08/13/2003   Diskectomy/fusion L5-S1 rt., Dr. Marnee Spring   lumbar hemilaminectomy  01/1989   L5-S1 on right, Dr. Shela Commons   NASAL SINUS SURGERY  10/30/2007   Left upper Sinus lift,  Dr. Clemetine Marker   SHOULDER ARTHROSCOPY WITH SUBACROMIAL DECOMPRESSION Right 12/06/2014   Procedure: SHOULDER ARTHROSCOPY WITH SUBACROMIAL DECOMPRESSION; EXCISION BONE SPUR;  Surgeon: Dominica Severin, MD;  Location:  SURGERY CENTER;  Service: Orthopedics;  Laterality: Right;   SHOULDER SURGERY Right 12/06/14   "cleaned up bone spurs"   SHOULDER SURGERY Right FEB 2016   SPINAL CORD STIMULATOR INSERTION  12/29/2004   leads placed along spine from T8 to T10, Dr. Judie Petit. Roy   Spinal cord stimulator pulse generator  01/05/2005   placed in right upper buttocks, Dr. Judie Petit. Roy   THYROID LOBECTOMY Right 02/13/2016   THYROIDECTOMY Right 02/13/2016   Procedure: RIGHT THYROID LOBECTOMY WITH FROZEN SECTION ;   Surgeon: Serena Colonel, MD;  Location: [facility] OR;  Service: ENT;  Laterality: Right;   TONSILLECTOMY      Prior to Admission medications   Medication Sig Start Date End Date Taking? Authorizing Provider  fluticasone (FLONASE) 50 MCG/ACT nasal spray Place 2 sprays into the nose daily as needed for allergies.     [provider]  gabapentin (NEURONTIN) 300 MG capsule Take 300 mg by mouth 3 (three) times daily. 10/31/23   [provider]  lamoTRIgine (LAMICTAL) 100 MG tablet Take 200 mg by mouth daily.    [provider]  levothyroxine (SYNTHROID) 112 MCG tablet Take 125 mcg by mouth 6 (six) times daily. 100 mg daily    [provider]  lisinopril-hydrochlorothiazide (ZESTORETIC) 20-12.5 MG tablet Take 0.5 tablets by mouth every morning. 11/22/22   Myrlene Broker, MD  pantoprazole (PROTONIX) 40 MG tablet Take 1 tablet (40 mg total) by mouth daily. Annual appt is due must see provider for future refills 03/03/23   Myrlene Broker, MD  pravastatin (PRAVACHOL) 20 MG tablet TAKE 1 TABLET EVERY DAY 12/21/23   Myrlene Broker, MD  rizatriptan (MAXALT) 10 MG tablet Take 1 tablet earliest onset of migraine.  May repeat in 2 hours if needed.  Maximum 2 tablets in 24 hours. 08/22/23   Drema Dallas, DO  traZODone (DESYREL) 150 MG tablet TAKE 1 TABLET AT BEDTIME 08/01/23   Myrlene Broker, MD    Allergies  Allergen Reactions   Iodinated Contrast Media Itching and Cough    Patient experienced itching of ears, coughing and scratchy throat after contrast injection.  Dr.Grady had patient observed for 10 minutes, patient discharged with symptoms resolved and no medicine administered.Dr Mosetta Putt instructed premeds prior to any future contrast study    Codeine Nausea Only   Imitrex [Sumatriptan] Hives    "get hot and hyperventilate"   Morphine Rash    Social History   Socioeconomic History   Marital status: Widowed    Spouse name: Not on file   Number of  children: Not on file   Years of education: Not on file   Highest education level: Not on file  Occupational History   Not on file  Tobacco Use   Smoking status: Former    Current packs/day: 0.00    Types: Cigarettes    Quit date: 84    Years since quitting: 33.2   Smokeless tobacco: Never   Tobacco comments:    quit smoking in 1992  Vaping Use   Vaping status: Never Used  Substance and Sexual Activity   Alcohol use: Yes    Alcohol/week: 0.0 standard drinks of alcohol    Comment: occ   Drug use: No   Sexual activity: Not Currently    Birth control/protection: Post-menopausal  Other Topics Concern   Not on file  Social History Narrative   Walks the dog exercise.  Has to stop breath while walking the dog.  Drinks about 3 caffeine drinks a week    Currently disabled after 8 back surgeries with spinal cord/injury   Right handed   Lives with husband one story home   Social Drivers of Health   Financial Resource Strain: Patient Declined (11/07/2023)   Overall Financial Resource Strain (CARDIA)    Difficulty of Paying Living Expenses: Patient declined  Food Insecurity: Patient Declined (11/07/2023)   Hunger Vital Sign    Worried About Running Out of Food in the Last Year: Patient declined    Ran Out of Food in the Last Year: Patient declined  Transportation Needs: Patient Declined (11/07/2023)   PRAPARE - Administrator, Civil Service (Medical): Patient declined    Lack of Transportation (Non-Medical): Patient declined  Physical Activity: Unknown (11/07/2023)   Exercise Vital Sign    Days of Exercise per Week: 0 days    Minutes of Exercise per Session: Not on file  Stress: Stress Concern Present (11/07/2023)   [employer] of Occupational Health - Occupational Stress Questionnaire    Feeling of Stress : To some extent  Social Connections: Unknown (11/07/2023)   Social Connection and Isolation Panel [NHANES]    Frequency of Communication with Friends and  Family: Three times a week    Frequency of Social Gatherings with Friends and Family: Patient declined    Attends Religious Services: Patient declined    Active Member of Clubs or Organizations: Patient declined    Attends Banker Meetings: Not on file    Marital Status: Widowed  Intimate Partner Violence: Not on file    Tobacco Use: Medium Risk (11/11/2023)   Patient History    Smoking Tobacco Use: Former    Smokeless Tobacco Use: Never    Passive Exposure: Not on file   Social History   Substance and Sexual Activity  Alcohol Use Yes   Alcohol/week: 0.0 standard drinks of alcohol   Comment: occ    Family History  Problem Relation Age of Onset   Hypertension Father    Cancer Father        colon, liver   Atrial fibrillation Father    Hypertension Sister    Hypertension Brother    Cancer Brother        lung, adrenal glands and brain   Hypertension Brother    Aneurysm Mother        brain   Stroke Mother 50    ROS   Objective:  Physical Exam: - Well-developed female alert and oriented in no apparent distress  - Evaluation of the left hip shows normal range of motion with no discomfort.  - Right hip can be flexed up to approximately 110 degrees with minimal internal rotation, approximately 20 degrees of external rotation, and 20 degrees of abduction. She has significant pain on flexion and rotation  - Tenderness noted over the greater trochanter.  - Gait pattern is significantly antalgic on the right.    IMAGING:   Radiograph of the right hip demonstrates significant joint space narrowing.   MRI of the right hip demonstrates a significant labral tear and subchondral cyst.  Assessment/Plan:  End stage arthritis, right hip  The patient history, physical examination, clinical judgement of the provider and imaging studies are consistent with end stage degenerative joint disease of the right hip and total hip arthroplasty is deemed medically necessary. The  treatment options including medical management, injection therapy, arthroscopy and arthroplasty were discussed at length. The risks and benefits of total hip arthroplasty  were presented and reviewed. The risks due to aseptic loosening, infection, stiffness, dislocation/subluxation, thromboembolic complications and other imponderables were discussed. The patient acknowledged the explanation, agreed to proceed with the plan and consent was signed. Patient is being admitted for inpatient treatment for surgery, pain control, PT, OT, prophylactic antibiotics, VTE prophylaxis, progressive ambulation and ADLs and discharge planning.The patient is planning to be discharged home.   Patient's anticipated LOS is less than 2 midnights, meeting these requirements: - Younger than 42 - Lives within 1 hour of care - Has a competent adult at home to recover with post-op recover - NO history of  - Chronic pain requiring opiods  - Diabetes  - Coronary Artery Disease  - Heart failure  - Heart attack  - Stroke  - DVT/VTE  - Cardiac arrhythmia  - Respiratory Failure/COPD  - Renal failure  - Anemia  - Advanced Liver disease   Therapy Plans: HEP Disposition: Home with daughter Planned DVT Prophylaxis: Aspirin 81mg  BID  DME Needed: RW ordered PCP: Hillard Danker, MD (clearance received) TXA: IV Allergies: codeine (nausea, itching), hydrocodone (nausea, itching), morphine (severe substernal pain), sumatriptan (hives), imitrex (hyperventilation, burning sensation)  Anesthesia Concerns: None BMI: 29 Last HgbA1c: Not diabetic Pharmacy: [facility], [address]  Other: -Discussed dilaudid for post-op use  - Patient was instructed on what medications to stop prior to surgery. - Follow-up visit in 2 weeks with Dr. Lequita Halt - Begin physical therapy following surgery - Pre-operative lab work as pre-surgical testing - Prescriptions will be provided in hospital at time of discharge  Weston Brass,  PA-C Orthopedic Surgery [facility] [place]

## 2024-01-04 ENCOUNTER — Encounter (HOSPITAL_COMMUNITY): Payer: Self-pay

## 2024-01-04 NOTE — Progress Notes (Addendum)
 PCP - Hillard Danker, MD LOV preop eval 11-11-23 epic Cardiologist - Bryan Lemma ,MD LOV 02-22-2018 epic  NEURO- Shon Millet, DO LOV 11-08-23 epic  PPM/ICD -  Device Orders -  Rep Notified -   Chest x-ray -  EKG - 11-11-23 epic Stress Test - 2019 epic ECHO - 2019 epic Cardiac Cath -   Sleep Study -  CPAP -   Fasting Blood Sugar -  Checks Blood Sugar _____ times a day  Blood Thinner Instructions: Aspirin Instructions:  ERAS Protcol - PRE-SURGERY G2-    COVID vaccine -yes  Activity--able to climb a flight of stairs without CP or SOB Anesthesia review: HTN , hepatitis  treated  , spinal cord stimulator instructed to bring remote  Patient denies shortness of breath, fever, cough and chest pain at PAT appointment   All instructions explained to the patient, with a verbal understanding of the material. Patient agrees to go over the instructions while at home for a better understanding. Patient also instructed to self quarantine after being tested for COVID-19. The opportunity to ask questions was provided.

## 2024-01-04 NOTE — Patient Instructions (Signed)
 SURGICAL WAITING ROOM VISITATION  Patients having surgery or a procedure may have no more than 2 support people in the waiting area - these visitors may rotate.    Children under the age of 19 must have an adult with them who is not the patient.  Due to an increase in RSV and influenza rates and associated hospitalizations, children ages 66 and under may not visit patients in [facility] hospitals.  Visitors with respiratory illnesses are discouraged from visiting and should remain at home.  If the patient needs to stay at the hospital during part of their recovery, the visitor guidelines for inpatient rooms apply. Pre-op nurse will coordinate an appropriate time for 1 support person to accompany patient in pre-op.  This support person may not rotate.    Please refer to the [facility] website for the visitor guidelines for Inpatients (after your surgery is over and you are in a regular room).       Your procedure is scheduled on: 01-18-24   Report to [facility] Main Entrance    Report to admitting at       0800  AM   Call this number if you have problems the morning of surgery [phone]   Do not eat food :After Midnight.   After Midnight you may have the following liquids until __0730 ____ AM/  DAY OF SURGERY  then nothing by mouth  Water Non-Citrus Juices (without pulp, NO RED-Apple, White grape, White cranberry) Black Coffee (NO MILK/CREAM OR CREAMERS, sugar ok)  Clear Tea (NO MILK/CREAM OR CREAMERS, sugar ok) regular and decaf                             Plain Jell-O (NO RED)                                           Fruit ices (not with fruit pulp, NO RED)                                     Popsicles (NO RED)                                                               Sports drinks like Gatorade (NO RED)                     The day of surgery:  Drink ONE (1) Pre-Surgery G2 by 0730  AM the morning of surgery. Drink in one sitting. Do not sip.  This drink  was given to you during your hospital  pre-op appointment visit. Nothing else to drink after completing the  Pre-Surgery  G2.          If you have questions, please contact your surgeon's office.   FOLLOW BOWEL PREP AND ANY ADDITIONAL PRE OP INSTRUCTIONS YOU RECEIVED FROM YOUR SURGEON'S OFFICE!!!     Oral Hygiene is also important to reduce your risk of infection.  Remember - BRUSH YOUR TEETH THE MORNING OF SURGERY WITH YOUR REGULAR TOOTHPASTE  DENTURES WILL BE REMOVED PRIOR TO SURGERY PLEASE DO NOT APPLY "Poly grip" OR ADHESIVES!!!   Do NOT smoke after Midnight   Stop all vitamins and herbal supplements 7 days before surgery.   Take these medicines the morning of surgery with A SIP OF WATER: prevastatin, pantoprazole, levothyroxine, escitalopram, lamotrigine                                  You may not have any metal on your body including hair pins, jewelry, and body piercing             Do not wear make-up, lotions, powders, perfumes or deodorant  Do not wear nail polish including gel and S&S, artificial/acrylic nails, or any other type of covering on natural nails including finger and toenails. If you have artificial nails, gel coating, etc. that needs to be removed by a nail salon please have this removed prior to surgery or surgery may need to be canceled/ delayed if the surgeon/ anesthesia feels like they are unable to be safely monitored.   Do not shave  48 hours prior to surgery.     Do not bring valuables to the hospital. [place] IS NOT             RESPONSIBLE   FOR VALUABLES.   Contacts, glasses, dentures or bridgework may not be worn into surgery.   Bring small overnight bag day of surgery.   DO NOT BRING YOUR HOME MEDICATIONS TO THE HOSPITAL. PHARMACY WILL DISPENSE MEDICATIONS LISTED ON YOUR MEDICATION LIST TO YOU DURING YOUR ADMISSION IN THE HOSPITAL!    Patients discharged on the day of surgery will not be allowed to  drive home.  Someone NEEDS to stay with you for the first 24 hours after anesthesia.   Special Instructions: Bring a copy of your healthcare power of attorney and living will documents the day of surgery if you haven't scanned them before.              Please read over the following fact sheets you were given: IF YOU HAVE QUESTIONS ABOUT YOUR PRE-OP INSTRUCTIONS PLEASE CALL [phone]    If you test positive for Covid or have been in contact with anyone that has tested positive in the last 10 days please notify you surgeon.      Pre-operative 5 CHG Bath Instructions   You can play a key role in reducing the risk of infection after surgery. Your skin needs to be as free of germs as possible. You can reduce the number of germs on your skin by washing with CHG (chlorhexidine gluconate) soap before surgery. CHG is an antiseptic soap that kills germs and continues to kill germs even after washing.   DO NOT use if you have an allergy to chlorhexidine/CHG or antibacterial soaps. If your skin becomes reddened or irritated, stop using the CHG and notify one of our RNs at [phone].   Please shower with the CHG soap starting 4 days before surgery using the following schedule:     Please keep in mind the following:  DO NOT shave, including legs and underarms, starting the day of your first shower.   You may shave your face at any point before/day of surgery.  Place clean sheets on your bed the day you start using CHG soap. Use a clean  washcloth (not used since being washed) for each shower. DO NOT sleep with pets once you start using the CHG.   CHG Shower Instructions:  If you choose to wash your hair and private area, wash first with your normal shampoo/soap.  After you use shampoo/soap, rinse your hair and body thoroughly to remove shampoo/soap residue.  Turn the water OFF and apply about 3 tablespoons (45 ml) of CHG soap to a CLEAN washcloth.  Apply CHG soap ONLY FROM YOUR NECK DOWN TO  YOUR TOES (washing for 3-5 minutes)  DO NOT use CHG soap on face, private areas, open wounds, or sores.  Pay special attention to the area where your surgery is being performed.  If you are having back surgery, having someone wash your back for you may be helpful. Wait 2 minutes after CHG soap is applied, then you may rinse off the CHG soap.  Pat dry with a clean towel  Put on clean clothes/pajamas   If you choose to wear lotion, please use ONLY the CHG-compatible lotions on the back of this paper.     Additional instructions for the day of surgery: DO NOT APPLY any lotions, deodorants, cologne, or perfumes.   Put on clean/comfortable clothes.  Brush your teeth.  Ask your nurse before applying any prescription medications to the skin.      CHG Compatible Lotions   Aveeno Moisturizing lotion  Cetaphil Moisturizing Cream  Cetaphil Moisturizing Lotion  Clairol Herbal Essence Moisturizing Lotion, Dry Skin  Clairol Herbal Essence Moisturizing Lotion, Extra Dry Skin  Clairol Herbal Essence Moisturizing Lotion, Normal Skin  Curel Age Defying Therapeutic Moisturizing Lotion with Alpha Hydroxy  Curel Extreme Care Body Lotion  Curel Soothing Hands Moisturizing Hand Lotion  Curel Therapeutic Moisturizing Cream, Fragrance-Free  Curel Therapeutic Moisturizing Lotion, Fragrance-Free  Curel Therapeutic Moisturizing Lotion, Original Formula  Eucerin Daily Replenishing Lotion  Eucerin Dry Skin Therapy Plus Alpha Hydroxy Crme  Eucerin Dry Skin Therapy Plus Alpha Hydroxy Lotion  Eucerin Original Crme  Eucerin Original Lotion  Eucerin Plus Crme Eucerin Plus Lotion  Eucerin TriLipid Replenishing Lotion  Keri Anti-Bacterial Hand Lotion  Keri Deep Conditioning Original Lotion Dry Skin Formula Softly Scented  Keri Deep Conditioning Original Lotion, Fragrance Free Sensitive Skin Formula  Keri Lotion Fast Absorbing Fragrance Free Sensitive Skin Formula  Keri Lotion Fast Absorbing Softly Scented  Dry Skin Formula  Keri Original Lotion  Keri Skin Renewal Lotion Keri Silky Smooth Lotion  Keri Silky Smooth Sensitive Skin Lotion  Nivea Body Creamy Conditioning Oil  Nivea Body Extra Enriched Teacher, adult education Moisturizing Lotion Nivea Crme  Nivea Skin Firming Lotion  NutraDerm 30 Skin Lotion  NutraDerm Skin Lotion  NutraDerm Therapeutic Skin Cream  NutraDerm Therapeutic Skin Lotion  ProShield Protective Hand Cream  Provon moisturizing lotion  WHAT IS A BLOOD TRANSFUSION? Blood Transfusion Information  A transfusion is the replacement of blood or some of its parts. Blood is made up of multiple cells which provide different functions. Red blood cells carry oxygen and are used for blood loss replacement. White blood cells fight against infection. Platelets control bleeding. Plasma helps clot blood. Other blood products are available for specialized needs, such as hemophilia or other clotting disorders. BEFORE THE TRANSFUSION  Who gives blood for transfusions?  Healthy volunteers who are fully evaluated to make sure their blood is safe. This is blood bank blood. Transfusion therapy is the safest it has ever been in the practice of medicine. Before  blood is taken from a donor, a complete history is taken to make sure that person has no history of diseases nor engages in risky social behavior (examples are intravenous drug use or sexual activity with multiple partners). The donor's travel history is screened to minimize risk of transmitting infections, such as malaria. The donated blood is tested for signs of infectious diseases, such as HIV and hepatitis. The blood is then tested to be sure it is compatible with you in order to minimize the chance of a transfusion reaction. If you or a relative donates blood, this is often done in anticipation of surgery and is not appropriate for emergency situations. It takes many days to process the donated blood. RISKS  AND COMPLICATIONS Although transfusion therapy is very safe and saves many lives, the main dangers of transfusion include:  Getting an infectious disease. Developing a transfusion reaction. This is an allergic reaction to something in the blood you were given. Every precaution is taken to prevent this. The decision to have a blood transfusion has been considered carefully by your caregiver before blood is given. Blood is not given unless the benefits outweigh the risks. AFTER THE TRANSFUSION Right after receiving a blood transfusion, you will usually feel much better and more energetic. This is especially true if your red blood cells have gotten low (anemic). The transfusion raises the level of the red blood cells which carry oxygen, and this usually causes an energy increase. The nurse administering the transfusion will monitor you carefully for complications. HOME CARE INSTRUCTIONS  No special instructions are needed after a transfusion. You may find your energy is better. Speak with your caregiver about any limitations on activity for underlying diseases you may have. SEEK MEDICAL CARE IF:  Your condition is not improving after your transfusion. You develop redness or irritation at the intravenous (IV) site. SEEK IMMEDIATE MEDICAL CARE IF:  Any of the following symptoms occur over the next 12 hours: Shaking chills. You have a temperature by mouth above 102 F (38.9 C), not controlled by medicine. Chest, back, or muscle pain. People around you feel you are not acting correctly or are confused. Shortness of breath or difficulty breathing. Dizziness and fainting. You get a rash or develop hives. You have a decrease in urine output. Your urine turns a dark color or changes to pink, red, or brown. Any of the following symptoms occur over the next 10 days: You have a temperature by mouth above 102 F (38.9 C), not controlled by medicine. Shortness of breath. Weakness after normal  activity. The white part of the eye turns yellow (jaundice). You have a decrease in the amount of urine or are urinating less often. Your urine turns a dark color or changes to pink, red, or brown. Document Released: 10/01/2000 Document Revised: 12/27/2011 Document Reviewed: 05/20/2008 Exit[employer] Patient Information 2014 [place], [place].  _______________________________________________________________________  Incentive Spirometer  An incentive spirometer is a tool that can help keep your lungs clear and active. This tool measures how well you are filling your lungs with each breath. Taking long deep breaths may help reverse or decrease the chance of developing breathing (pulmonary) problems (especially infection) following: A long period of time when you are unable to move or be active. BEFORE THE PROCEDURE  If the spirometer includes an indicator to show your best effort, your nurse or respiratory therapist will set it to a desired goal. If possible, sit up straight or lean slightly forward. Try not to slouch. Hold the incentive  spirometer in an upright position. INSTRUCTIONS FOR USE  Sit on the edge of your bed if possible, or sit up as far as you can in bed or on a chair. Hold the incentive spirometer in an upright position. Breathe out normally. Place the mouthpiece in your mouth and seal your lips tightly around it. Breathe in slowly and as deeply as possible, raising the piston or the ball toward the top of the column. Hold your breath for 3-5 seconds or for as long as possible. Allow the piston or ball to fall to the bottom of the column. Remove the mouthpiece from your mouth and breathe out normally. Rest for a few seconds and repeat Steps 1 through 7 at least 10 times every 1-2 hours when you are awake. Take your time and take a few normal breaths between deep breaths. The spirometer may include an indicator to show your best effort. Use the indicator as a goal to work toward during  each repetition. After each set of 10 deep breaths, practice coughing to be sure your lungs are clear. If you have an incision (the cut made at the time of surgery), support your incision when coughing by placing a pillow or rolled up towels firmly against it. Once you are able to get out of bed, walk around indoors and cough well. You may stop using the incentive spirometer when instructed by your caregiver.  RISKS AND COMPLICATIONS Take your time so you do not get dizzy or light-headed. If you are in pain, you may need to take or ask for pain medication before doing incentive spirometry. It is harder to take a deep breath if you are having pain. AFTER USE Rest and breathe slowly and easily. It can be helpful to keep track of a log of your progress. Your caregiver can provide you with a simple table to help with this. If you are using the spirometer at home, follow these instructions: SEEK MEDICAL CARE IF:  You are having difficultly using the spirometer. You have trouble using the spirometer as often as instructed. Your pain medication is not giving enough relief while using the spirometer. You develop fever of 100.5 F (38.1 C) or higher. SEEK IMMEDIATE MEDICAL CARE IF:  You cough up bloody sputum that had not been present before. You develop fever of 102 F (38.9 C) or greater. You develop worsening pain at or near the incision site. MAKE SURE YOU:  Understand these instructions. Will watch your condition. Will get help right away if you are not doing well or get worse. Document Released: 02/14/2007 Document Revised: 12/27/2011 Document Reviewed: 04/17/2007 [facility] Patient Information 2014 [place], [place].   ________________________________________________________________________

## 2024-01-05 ENCOUNTER — Encounter (HOSPITAL_COMMUNITY): Payer: Self-pay

## 2024-01-05 ENCOUNTER — Other Ambulatory Visit: Payer: Self-pay

## 2024-01-05 ENCOUNTER — Encounter (HOSPITAL_COMMUNITY)
Admission: RE | Admit: 2024-01-05 | Discharge: 2024-01-05 | Disposition: A | Discharge status: Home / Self Care | Payer: Medicare HMO | Source: Ambulatory Visit | Attending: Orthopedic Surgery | Admitting: Orthopedic Surgery

## 2024-01-05 VITALS — BP 139/81 | HR 67 | Temp 98.3°F | Resp 16 | Ht 66.0 in | Wt 173.0 lb

## 2024-01-05 DIAGNOSIS — Z01818 Encounter for other preprocedural examination: Secondary | ICD-10-CM

## 2024-01-05 DIAGNOSIS — I10 Essential (primary) hypertension: Secondary | ICD-10-CM | POA: Diagnosis not present

## 2024-01-05 DIAGNOSIS — Z01812 Encounter for preprocedural laboratory examination: Secondary | ICD-10-CM | POA: Insufficient documentation

## 2024-01-05 HISTORY — DX: Personal history of urinary calculi: Z87.442

## 2024-01-05 HISTORY — DX: Prediabetes: R73.03

## 2024-01-05 HISTORY — DX: Hypothyroidism, unspecified: E03.9

## 2024-01-05 HISTORY — DX: Personal history of other diseases of the digestive system: Z87.19

## 2024-01-05 LAB — CBC
HCT: 41.4 % (ref 36.0–46.0)
Hemoglobin: 13.2 g/dL (ref 12.0–15.0)
MCH: 28.2 pg (ref 26.0–34.0)
MCHC: 31.9 g/dL (ref 30.0–36.0)
MCV: 88.5 fL (ref 80.0–100.0)
Platelets: 229 10*3/uL (ref 150–400)
RBC: 4.68 MIL/uL (ref 3.87–5.11)
RDW: 13.4 % (ref 11.5–15.5)
WBC: 6.8 10*3/uL (ref 4.0–10.5)
nRBC: 0 % (ref 0.0–0.2)

## 2024-01-05 LAB — SURGICAL PCR SCREEN
MRSA, PCR: NEGATIVE
Staphylococcus aureus: NEGATIVE

## 2024-01-05 LAB — TYPE AND SCREEN
ABO/RH(D): AB POS
Antibody Screen: NEGATIVE

## 2024-01-05 LAB — BASIC METABOLIC PANEL
Anion gap: 9 (ref 5–15)
BUN: 21 mg/dL (ref 8–23)
CO2: 24 mmol/L (ref 22–32)
Calcium: 8.9 mg/dL (ref 8.9–10.3)
Chloride: 102 mmol/L (ref 98–111)
Creatinine, Ser: 1.26 mg/dL — ABNORMAL HIGH (ref 0.44–1.00)
GFR, Estimated: 47 mL/min — ABNORMAL LOW (ref >60)
Glucose, Bld: 94 mg/dL (ref 70–99)
Potassium: 3.8 mmol/L (ref 3.5–5.1)
Sodium: 135 mmol/L (ref 135–145)

## 2024-01-06 DIAGNOSIS — L309 Dermatitis, unspecified: Secondary | ICD-10-CM | POA: Diagnosis not present

## 2024-01-18 ENCOUNTER — Ambulatory Visit (HOSPITAL_COMMUNITY)

## 2024-01-18 ENCOUNTER — Observation Stay (HOSPITAL_COMMUNITY)

## 2024-01-18 ENCOUNTER — Ambulatory Visit (HOSPITAL_BASED_OUTPATIENT_CLINIC_OR_DEPARTMENT_OTHER): Payer: Self-pay | Admitting: Certified Registered Nurse Anesthetist

## 2024-01-18 ENCOUNTER — Ambulatory Visit (HOSPITAL_COMMUNITY): Payer: Self-pay | Admitting: Certified Registered Nurse Anesthetist

## 2024-01-18 ENCOUNTER — Observation Stay (HOSPITAL_COMMUNITY)
Admission: RE | Admit: 2024-01-18 | Discharge: 2024-01-19 | Disposition: A | Discharge status: Home / Self Care | Payer: Medicare HMO | Attending: Orthopedic Surgery | Admitting: Orthopedic Surgery

## 2024-01-18 ENCOUNTER — Other Ambulatory Visit: Payer: Self-pay

## 2024-01-18 ENCOUNTER — Encounter (HOSPITAL_COMMUNITY): Payer: Self-pay | Admitting: Orthopedic Surgery

## 2024-01-18 ENCOUNTER — Encounter (HOSPITAL_COMMUNITY)
Admission: RE | Disposition: A | Discharge status: Home / Self Care | Payer: Self-pay | Source: Home / Self Care | Attending: Orthopedic Surgery

## 2024-01-18 DIAGNOSIS — I129 Hypertensive chronic kidney disease with stage 1 through stage 4 chronic kidney disease, or unspecified chronic kidney disease: Secondary | ICD-10-CM | POA: Insufficient documentation

## 2024-01-18 DIAGNOSIS — N183 Chronic kidney disease, stage 3 unspecified: Secondary | ICD-10-CM | POA: Insufficient documentation

## 2024-01-18 DIAGNOSIS — Z79899 Other long term (current) drug therapy: Secondary | ICD-10-CM | POA: Insufficient documentation

## 2024-01-18 DIAGNOSIS — Z87891 Personal history of nicotine dependence: Secondary | ICD-10-CM | POA: Insufficient documentation

## 2024-01-18 DIAGNOSIS — M169 Osteoarthritis of hip, unspecified: Secondary | ICD-10-CM | POA: Diagnosis present

## 2024-01-18 DIAGNOSIS — M1611 Unilateral primary osteoarthritis, right hip: Secondary | ICD-10-CM | POA: Diagnosis not present

## 2024-01-18 DIAGNOSIS — E039 Hypothyroidism, unspecified: Secondary | ICD-10-CM | POA: Diagnosis not present

## 2024-01-18 DIAGNOSIS — M25551 Pain in right hip: Principal | ICD-10-CM

## 2024-01-18 DIAGNOSIS — Z96641 Presence of right artificial hip joint: Secondary | ICD-10-CM | POA: Diagnosis not present

## 2024-01-18 HISTORY — PX: TOTAL HIP ARTHROPLASTY: SHX124

## 2024-01-18 LAB — ABO/RH: ABO/RH(D): AB POS

## 2024-01-18 SURGERY — ARTHROPLASTY, HIP, TOTAL, ANTERIOR APPROACH
Anesthesia: Spinal | Site: Hip | Laterality: Right

## 2024-01-18 MED ORDER — TRANEXAMIC ACID-NACL 1000-0.7 MG/100ML-% IV SOLN
1000.0000 mg | INTRAVENOUS | Status: AC
  Administered 2024-01-18: 1000 mg via INTRAVENOUS
  Filled 2024-01-18: qty 100

## 2024-01-18 MED ORDER — METOCLOPRAMIDE HCL 5 MG/ML IJ SOLN
5.0000 mg | Freq: Three times a day (TID) | INTRAMUSCULAR | Status: DC | PRN
  Administered 2024-01-18: 5 mg via INTRAVENOUS
  Filled 2024-01-18: qty 2

## 2024-01-18 MED ORDER — HYDROMORPHONE HCL 2 MG PO TABS
ORAL_TABLET | ORAL | Status: AC
  Filled 2024-01-18: qty 1

## 2024-01-18 MED ORDER — PROPOFOL 1000 MG/100ML IV EMUL
INTRAVENOUS | Status: AC
  Filled 2024-01-18: qty 100

## 2024-01-18 MED ORDER — PHENOL 1.4 % MT LIQD: 1.0000 | OROMUCOSAL | Status: DC | PRN

## 2024-01-18 MED ORDER — ASPIRIN 81 MG PO CHEW
81.0000 mg | CHEWABLE_TABLET | Freq: Two times a day (BID) | ORAL | Status: DC
  Administered 2024-01-19: 81 mg via ORAL
  Filled 2024-01-18: qty 1

## 2024-01-18 MED ORDER — ALBUMIN HUMAN 5 % IV SOLN
INTRAVENOUS | Status: AC
Start: 2024-01-18 — End: 2024-01-19
  Filled 2024-01-18: qty 250

## 2024-01-18 MED ORDER — ESCITALOPRAM OXALATE 10 MG PO TABS
10.0000 mg | ORAL_TABLET | Freq: Every day | ORAL | Status: DC
  Administered 2024-01-18: 10 mg via ORAL
  Filled 2024-01-18: qty 1

## 2024-01-18 MED ORDER — ALBUMIN HUMAN 5 % IV SOLN
12.5000 g | Freq: Once | INTRAVENOUS | Status: AC
  Administered 2024-01-18: 12.5 g via INTRAVENOUS

## 2024-01-18 MED ORDER — MAGNESIUM CITRATE PO SOLN: 1.0000 | Freq: Once | ORAL | Status: DC | PRN

## 2024-01-18 MED ORDER — METHOCARBAMOL 500 MG PO TABS
ORAL_TABLET | ORAL | Status: AC
Start: 2024-01-18 — End: 2024-01-18
  Filled 2024-01-18: qty 1

## 2024-01-18 MED ORDER — BUPIVACAINE-EPINEPHRINE (PF) 0.25% -1:200000 IJ SOLN
INTRAMUSCULAR | Status: AC
  Filled 2024-01-18: qty 30

## 2024-01-18 MED ORDER — HYDROMORPHONE HCL 2 MG PO TABS
2.0000 mg | ORAL_TABLET | ORAL | Status: DC | PRN
  Administered 2024-01-19 (×2): 3 mg via ORAL
  Filled 2024-01-18 (×2): qty 2

## 2024-01-18 MED ORDER — DEXAMETHASONE SODIUM PHOSPHATE 10 MG/ML IJ SOLN
8.0000 mg | Freq: Once | INTRAMUSCULAR | Status: AC
  Administered 2024-01-18: 8 mg via INTRAVENOUS

## 2024-01-18 MED ORDER — METOCLOPRAMIDE HCL 5 MG PO TABS: 5.0000 mg | ORAL_TABLET | Freq: Three times a day (TID) | ORAL | Status: DC | PRN

## 2024-01-18 MED ORDER — POVIDONE-IODINE 10 % EX SWAB: 2.0000 | Freq: Once | CUTANEOUS | Status: DC

## 2024-01-18 MED ORDER — SODIUM CHLORIDE 0.9 % IV SOLN: INTRAVENOUS | Status: DC

## 2024-01-18 MED ORDER — EPHEDRINE SULFATE-NACL 50-0.9 MG/10ML-% IV SOSY
PREFILLED_SYRINGE | INTRAVENOUS | Status: DC | PRN
Start: 2024-01-18 — End: 2024-01-18
  Administered 2024-01-18 (×4): 5 mg via INTRAVENOUS

## 2024-01-18 MED ORDER — HYDROCHLOROTHIAZIDE 12.5 MG PO TABS
12.5000 mg | ORAL_TABLET | Freq: Every day | ORAL | Status: DC
  Administered 2024-01-19: 12.5 mg via ORAL
  Filled 2024-01-18: qty 1

## 2024-01-18 MED ORDER — DEXAMETHASONE SODIUM PHOSPHATE 10 MG/ML IJ SOLN
10.0000 mg | Freq: Once | INTRAMUSCULAR | Status: AC
  Administered 2024-01-19: 10 mg via INTRAVENOUS
  Filled 2024-01-18: qty 1

## 2024-01-18 MED ORDER — METHOCARBAMOL 1000 MG/10ML IJ SOLN: 500.0000 mg | Freq: Four times a day (QID) | INTRAMUSCULAR | Status: DC | PRN

## 2024-01-18 MED ORDER — BISACODYL 10 MG RE SUPP: 10.0000 mg | Freq: Every day | RECTAL | Status: DC | PRN

## 2024-01-18 MED ORDER — HYDROMORPHONE HCL 1 MG/ML IJ SOLN
0.5000 mg | INTRAMUSCULAR | Status: DC | PRN
  Administered 2024-01-18 (×2): 1 mg via INTRAVENOUS
  Administered 2024-01-18: 0.5 mg via INTRAVENOUS
  Administered 2024-01-19 (×2): 1 mg via INTRAVENOUS
  Filled 2024-01-18 (×5): qty 1

## 2024-01-18 MED ORDER — OXYCODONE HCL 5 MG PO TABS
ORAL_TABLET | ORAL | Status: AC
  Filled 2024-01-18: qty 1

## 2024-01-18 MED ORDER — PANTOPRAZOLE SODIUM 40 MG PO TBEC
40.0000 mg | DELAYED_RELEASE_TABLET | Freq: Every day | ORAL | Status: DC
  Administered 2024-01-19: 40 mg via ORAL
  Filled 2024-01-18: qty 1

## 2024-01-18 MED ORDER — LACTATED RINGERS IV SOLN: INTRAVENOUS | Status: DC

## 2024-01-18 MED ORDER — BUPIVACAINE-EPINEPHRINE (PF) 0.25% -1:200000 IJ SOLN
INTRAMUSCULAR | Status: DC | PRN
  Administered 2024-01-18: 30 mL

## 2024-01-18 MED ORDER — CYCLOBENZAPRINE HCL 10 MG PO TABS
10.0000 mg | ORAL_TABLET | Freq: Three times a day (TID) | ORAL | Status: DC | PRN
  Administered 2024-01-19 (×2): 10 mg via ORAL
  Filled 2024-01-18 (×2): qty 1

## 2024-01-18 MED ORDER — ONDANSETRON HCL 4 MG PO TABS: 4.0000 mg | ORAL_TABLET | Freq: Four times a day (QID) | ORAL | Status: DC | PRN

## 2024-01-18 MED ORDER — DOCUSATE SODIUM 100 MG PO CAPS
100.0000 mg | ORAL_CAPSULE | Freq: Two times a day (BID) | ORAL | Status: DC
  Administered 2024-01-18 – 2024-01-19 (×2): 100 mg via ORAL
  Filled 2024-01-18 (×2): qty 1

## 2024-01-18 MED ORDER — CHLORHEXIDINE GLUCONATE 0.12 % MT SOLN
15.0000 mL | Freq: Once | OROMUCOSAL | Status: AC
  Administered 2024-01-18: 15 mL via OROMUCOSAL

## 2024-01-18 MED ORDER — DEXMEDETOMIDINE HCL IN NACL 80 MCG/20ML IV SOLN
INTRAVENOUS | Status: DC | PRN
  Administered 2024-01-18 (×2): 4 ug via INTRAVENOUS

## 2024-01-18 MED ORDER — CEFAZOLIN SODIUM-DEXTROSE 2-4 GM/100ML-% IV SOLN
2.0000 g | Freq: Four times a day (QID) | INTRAVENOUS | Status: AC
  Administered 2024-01-18 (×2): 2 g via INTRAVENOUS
  Filled 2024-01-18 (×2): qty 100

## 2024-01-18 MED ORDER — LAMOTRIGINE 25 MG PO TABS
150.0000 mg | ORAL_TABLET | Freq: Every day | ORAL | Status: DC
  Filled 2024-01-18: qty 2

## 2024-01-18 MED ORDER — DEXAMETHASONE SODIUM PHOSPHATE 10 MG/ML IJ SOLN
INTRAMUSCULAR | Status: AC
  Filled 2024-01-18: qty 1

## 2024-01-18 MED ORDER — PRAVASTATIN SODIUM 20 MG PO TABS
20.0000 mg | ORAL_TABLET | Freq: Every day | ORAL | Status: DC
  Administered 2024-01-19: 20 mg via ORAL
  Filled 2024-01-18: qty 1

## 2024-01-18 MED ORDER — PHENYLEPHRINE HCL-NACL 20-0.9 MG/250ML-% IV SOLN
INTRAVENOUS | Status: DC | PRN
  Administered 2024-01-18: 50 ug/min via INTRAVENOUS

## 2024-01-18 MED ORDER — MENTHOL 3 MG MT LOZG: 1.0000 | LOZENGE | OROMUCOSAL | Status: DC | PRN

## 2024-01-18 MED ORDER — ACETAMINOPHEN 325 MG PO TABS
325.0000 mg | ORAL_TABLET | Freq: Four times a day (QID) | ORAL | Status: DC | PRN
  Administered 2024-01-19: 650 mg via ORAL
  Filled 2024-01-18: qty 2

## 2024-01-18 MED ORDER — LEVOTHYROXINE SODIUM 100 MCG PO TABS
100.0000 ug | ORAL_TABLET | Freq: Every day | ORAL | Status: DC
  Administered 2024-01-19: 100 ug via ORAL
  Filled 2024-01-18: qty 1

## 2024-01-18 MED ORDER — CEFAZOLIN SODIUM-DEXTROSE 2-4 GM/100ML-% IV SOLN
2.0000 g | INTRAVENOUS | Status: AC
  Administered 2024-01-18: 2 g via INTRAVENOUS
  Filled 2024-01-18: qty 100

## 2024-01-18 MED ORDER — ONDANSETRON HCL 4 MG/2ML IJ SOLN
INTRAMUSCULAR | Status: AC
  Filled 2024-01-18: qty 2

## 2024-01-18 MED ORDER — PROPOFOL 500 MG/50ML IV EMUL
INTRAVENOUS | Status: DC | PRN
Start: 2024-01-18 — End: 2024-01-18
  Administered 2024-01-18 (×2): 20 mg via INTRAVENOUS
  Administered 2024-01-18: 10 mg via INTRAVENOUS
  Administered 2024-01-18: 50 ug/kg/min via INTRAVENOUS

## 2024-01-18 MED ORDER — ORAL CARE MOUTH RINSE: 15.0000 mL | Freq: Once | OROMUCOSAL | Status: AC

## 2024-01-18 MED ORDER — METHOCARBAMOL 500 MG PO TABS
500.0000 mg | ORAL_TABLET | Freq: Four times a day (QID) | ORAL | Status: DC | PRN
  Administered 2024-01-18: 500 mg via ORAL

## 2024-01-18 MED ORDER — HYDROMORPHONE HCL 2 MG PO TABS
1.0000 mg | ORAL_TABLET | ORAL | Status: DC | PRN
  Administered 2024-01-18: 2 mg via ORAL

## 2024-01-18 MED ORDER — ALUM & MAG HYDROXIDE-SIMETH 200-200-20 MG/5ML PO SUSP
15.0000 mL | Freq: Four times a day (QID) | ORAL | Status: DC | PRN
  Administered 2024-01-18: 15 mL via ORAL
  Filled 2024-01-18: qty 30

## 2024-01-18 MED ORDER — ACETAMINOPHEN 10 MG/ML IV SOLN
1000.0000 mg | Freq: Four times a day (QID) | INTRAVENOUS | Status: DC
  Administered 2024-01-18: 1000 mg via INTRAVENOUS
  Filled 2024-01-18: qty 100

## 2024-01-18 MED ORDER — MIDAZOLAM HCL 2 MG/2ML IJ SOLN
INTRAMUSCULAR | Status: AC
  Filled 2024-01-18: qty 2

## 2024-01-18 MED ORDER — 0.9 % SODIUM CHLORIDE (POUR BTL) OPTIME
TOPICAL | Status: DC | PRN
  Administered 2024-01-18: 1000 mL

## 2024-01-18 MED ORDER — ONDANSETRON HCL 4 MG/2ML IJ SOLN
INTRAMUSCULAR | Status: DC | PRN
  Administered 2024-01-18: 4 mg via INTRAVENOUS

## 2024-01-18 MED ORDER — SUMATRIPTAN SUCCINATE 50 MG PO TABS
100.0000 mg | ORAL_TABLET | ORAL | Status: DC | PRN
Start: 2024-01-18 — End: 2024-01-19

## 2024-01-18 MED ORDER — EPHEDRINE 5 MG/ML INJ
INTRAVENOUS | Status: AC
  Filled 2024-01-18: qty 5

## 2024-01-18 MED ORDER — ONDANSETRON HCL 4 MG/2ML IJ SOLN
4.0000 mg | Freq: Four times a day (QID) | INTRAMUSCULAR | Status: DC | PRN
  Administered 2024-01-18 – 2024-01-19 (×3): 4 mg via INTRAVENOUS
  Filled 2024-01-18 (×4): qty 2

## 2024-01-18 MED ORDER — MIDAZOLAM HCL 2 MG/2ML IJ SOLN
INTRAMUSCULAR | Status: DC | PRN
  Administered 2024-01-18 (×2): 1 mg via INTRAVENOUS

## 2024-01-18 MED ORDER — TRAZODONE HCL 50 MG PO TABS
75.0000 mg | ORAL_TABLET | Freq: Every day | ORAL | Status: DC
  Administered 2024-01-18: 150 mg via ORAL
  Filled 2024-01-18: qty 3

## 2024-01-18 MED ORDER — POLYETHYLENE GLYCOL 3350 17 G PO PACK
17.0000 g | PACK | Freq: Every day | ORAL | Status: DC | PRN
  Administered 2024-01-19: 17 g via ORAL
  Filled 2024-01-18: qty 1

## 2024-01-18 MED ORDER — LAMOTRIGINE 25 MG PO TABS
150.0000 mg | ORAL_TABLET | Freq: Every day | ORAL | Status: DC
  Administered 2024-01-18: 150 mg via ORAL
  Filled 2024-01-18: qty 2

## 2024-01-18 MED ORDER — BUPIVACAINE IN DEXTROSE 0.75-8.25 % IT SOLN
INTRATHECAL | Status: DC | PRN
  Administered 2024-01-18: 1.6 mL via INTRATHECAL

## 2024-01-18 SURGICAL SUPPLY — 40 items
BAG COUNTER SPONGE SURGICOUNT (BAG) IMPLANT
BAG ZIPLOCK 12X15 (MISCELLANEOUS) IMPLANT
BALL HIP CERAMIC (Hips) IMPLANT
BLADE SAG 18X100X1.27 (BLADE) ×1 IMPLANT
COVER PERINEAL POST (MISCELLANEOUS) ×1 IMPLANT
COVER SURGICAL LIGHT HANDLE (MISCELLANEOUS) ×1 IMPLANT
CUP ACET PINNACLE SECTR 50MM (Hips) IMPLANT
DERMABOND ADVANCED .7 DNX12 (GAUZE/BANDAGES/DRESSINGS) ×1 IMPLANT
DRAPE C-ARM 42X120 X-RAY (DRAPES) IMPLANT
DRAPE FOOT SWITCH (DRAPES) ×1 IMPLANT
DRAPE STERI IOBAN 125X83 (DRAPES) ×1 IMPLANT
DRAPE U-SHAPE 47X51 STRL (DRAPES) ×2 IMPLANT
DRSG AQUACEL AG ADV 3.5X10 (GAUZE/BANDAGES/DRESSINGS) ×1 IMPLANT
DURAPREP 26ML APPLICATOR (WOUND CARE) ×1 IMPLANT
ELECT REM PT RETURN 15FT ADLT (MISCELLANEOUS) ×1 IMPLANT
GLOVE BIO SURGEON STRL SZ 6.5 (GLOVE) IMPLANT
GLOVE BIO SURGEON STRL SZ7 (GLOVE) IMPLANT
GLOVE BIO SURGEON STRL SZ8 (GLOVE) ×1 IMPLANT
GLOVE BIOGEL PI IND STRL 7.0 (GLOVE) IMPLANT
GLOVE BIOGEL PI IND STRL 8 (GLOVE) ×1 IMPLANT
GOWN STRL REUS W/ TWL LRG LVL3 (GOWN DISPOSABLE) ×2 IMPLANT
HIP BALL CERAMIC (Hips) ×1 IMPLANT
HOLDER FOLEY CATH W/STRAP (MISCELLANEOUS) ×1 IMPLANT
KIT TURNOVER KIT A (KITS) IMPLANT
LINER MARATHON 32 50 (Hips) IMPLANT
MANIFOLD NEPTUNE II (INSTRUMENTS) ×1 IMPLANT
PACK ANTERIOR HIP CUSTOM (KITS) ×1 IMPLANT
PENCIL SMOKE EVACUATOR COATED (MISCELLANEOUS) ×1 IMPLANT
PINNACLE SECTOR CUP 50MM (Hips) ×1 IMPLANT
SPIKE FLUID TRANSFER (MISCELLANEOUS) ×1 IMPLANT
STEM FEMORAL SZ 6MM STD ACTIS (Stem) IMPLANT
SUT ETHIBOND NAB CT1 #1 30IN (SUTURE) ×1 IMPLANT
SUT MNCRL AB 4-0 PS2 18 (SUTURE) ×1 IMPLANT
SUT STRATAFIX 0 PDS 27 VIOLET (SUTURE) ×1 IMPLANT
SUT VIC AB 2-0 CT1 TAPERPNT 27 (SUTURE) ×2 IMPLANT
SUTURE STRATFX 0 PDS 27 VIOLET (SUTURE) ×1 IMPLANT
TOWEL GREEN STERILE FF (TOWEL DISPOSABLE) ×1 IMPLANT
TRAY FOLEY MTR SLVR 14FR STAT (SET/KITS/TRAYS/PACK) IMPLANT
TRAY FOLEY MTR SLVR 16FR STAT (SET/KITS/TRAYS/PACK) ×1 IMPLANT
TUBE SUCTION HIGH CAP CLEAR NV (SUCTIONS) ×1 IMPLANT

## 2024-01-18 NOTE — Transfer of Care (Signed)
 Immediate Anesthesia Transfer of Care Note  Patient: [name]  Procedure(s) Performed: Procedure(s): ARTHROPLASTY, HIP, TOTAL, ANTERIOR APPROACH (Right)  Patient Location: PACU  Anesthesia Type:MAC and Spinal  Level of Consciousness: Patient easily comfortable, cooperative, following commands, responds to stimulation.   Airway & Oxygen Therapy: Patient spontaneously breathing, ventilating well, oxygen via simple oxygen mask.  Post-op Assessment: Report given to PACU RN, vital signs reviewed and stable.   Post vital signs: Reviewed and stable.  Complications: No apparent anesthesia complications  Last Vitals:  Vitals Value Taken Time  BP 89/45 01/18/24 1116  Temp    Pulse 65 01/18/24 1120  Resp 16 01/18/24 1120  SpO2 97 % 01/18/24 1120  Vitals shown include unfiled device data. 5 Mg Ephedrine IV given at 1117. BP 94/49 at 1122, another 5 mg Ephedrine IV given  Last Pain:  Vitals:   01/18/24 0815  TempSrc:   PainSc: 7          Complications: No notable events documented.

## 2024-01-18 NOTE — Anesthesia Postprocedure Evaluation (Signed)
 Anesthesia Post Note  Patient: [name]  Procedure(s) Performed: ARTHROPLASTY, HIP, TOTAL, ANTERIOR APPROACH (Right: Hip)     Patient location during evaluation: PACU Anesthesia Type: Spinal Level of consciousness: awake and alert Pain management: pain level controlled Vital Signs Assessment: post-procedure vital signs reviewed and stable Respiratory status: spontaneous breathing, nonlabored ventilation, respiratory function stable and patient connected to nasal cannula oxygen Cardiovascular status: blood pressure returned to baseline and stable Postop Assessment: no apparent nausea or vomiting Anesthetic complications: no   No notable events documented.  Last Vitals:  Vitals:   01/18/24 1417 01/18/24 1604  BP: (!) 134/56 (!) 114/48  Pulse: 64 70  Resp: 18 16  Temp: 36.6 C (!) 36.4 C  SpO2: 95% 94%    Last Pain:  Vitals:   01/18/24 1604  TempSrc: Oral  PainSc:                  Mariann Barter

## 2024-01-18 NOTE — Anesthesia Procedure Notes (Signed)
 Spinal  Patient location during procedure: OR Start time: 01/18/2024 9:34 AM End time: 01/18/2024 9:38 AM Reason for block: surgical anesthesia Staffing Anesthesiologist: Mariann Barter, MD Performed by: Mariann Barter, MD Authorized by: Mariann Barter, MD   Preanesthetic Checklist Completed: patient identified, IV checked, site marked, risks and benefits discussed, surgical consent, monitors and equipment checked, pre-op evaluation and timeout performed Spinal Block Patient position: sitting Prep: DuraPrep Patient monitoring: heart rate, cardiac monitor, continuous pulse ox and blood pressure Approach: midline Location: L3-4 Injection technique: single-shot Needle Needle type: Sprotte  Needle gauge: 24 G Needle length: 9 cm Assessment Sensory level: T4 Events: CSF return

## 2024-01-18 NOTE — Care Plan (Signed)
 Ortho Bundle Case Management Note  Patient Details  Name: CHRISTIONNA POLAND MRN: [id number] Date of Birth: [birth year]  R THA on 4/2/2.   DCP: Home with daughter.  DME: RW, ordered through [employer] PT: HEP                 DME Arranged:  Walker rolling DME Agency:  [employer]  HH Arranged:    HH Agency:     Additional Comments: Please contact me with any questions of if this plan should need to change.  Despina Pole, CCM [facility] [phone]   01/18/2024, 8:00 AM

## 2024-01-18 NOTE — Anesthesia Procedure Notes (Signed)
 Procedure Name: MAC Date/Time: 01/18/2024 9:37 AM  Performed by: Ludwig Lean, CRNAPre-anesthesia Checklist: Patient identified, Emergency Drugs available, Suction available and Patient being monitored Patient Re-evaluated:Patient Re-evaluated prior to induction Oxygen Delivery Method: Simple face mask Placement Confirmation: positive ETCO2 and breath sounds checked- equal and bilateral

## 2024-01-18 NOTE — Op Note (Signed)
 OPERATIVE REPORT- TOTAL HIP ARTHROPLASTY   PREOPERATIVE DIAGNOSIS: Osteoarthritis of the Right hip.   POSTOPERATIVE DIAGNOSIS: Osteoarthritis of the Right  hip.   PROCEDURE: Right total hip arthroplasty, anterior approach.   SURGEON: Ollen Gross, MD   ASSISTANT: Arther Abbott, PA-C  ANESTHESIA:  Spinal  ESTIMATED BLOOD LOSS:-750 mL    DRAINS: None  COMPLICATIONS: None   CONDITION: PACU - hemodynamically stable.   BRIEF CLINICAL NOTE: [name] is a 68 y.o. female who has advanced end-  stage arthritis of their Right  hip with progressively worsening pain and  dysfunction.The patient has failed nonoperative management and presents for  total hip arthroplasty.   PROCEDURE IN DETAIL: After successful administration of spinal  anesthetic, the traction boots for the [facility] bed were placed on both  feet and the patient was placed onto the [facility] bed, boots placed into the leg  holders. The Right hip was then isolated from the perineum with plastic  drapes and prepped and draped in the usual sterile fashion. ASIS and  greater trochanter were marked and a oblique incision was made, starting  at about 1 cm lateral and 2 cm distal to the ASIS and coursing towards  the anterior cortex of the femur. The skin was cut with a 10 blade  through subcutaneous tissue to the level of the fascia overlying the  tensor fascia lata muscle. The fascia was then incised in line with the  incision at the junction of the anterior third and posterior 2/3rd. The  muscle was teased off the fascia and then the interval between the TFL  and the rectus was developed. The Hohmann retractor was then placed at  the top of the femoral neck over the capsule. The vessels overlying the  capsule were cauterized and the fat on top of the capsule was removed.  A Hohmann retractor was then placed anterior underneath the rectus  femoris to give exposure to the entire anterior capsule. A T-shaped   capsulotomy was performed. The edges were tagged and the femoral head  was identified.       Osteophytes are removed off the superior acetabulum.  The femoral neck was then cut in situ with an oscillating saw. Traction  was then applied to the left lower extremity utilizing the [facility]  traction. The femoral head was then removed. Retractors were placed  around the acetabulum and then circumferential removal of the labrum was  performed. Osteophytes were also removed. Reaming starts at 47 mm to  medialize and  Increased in 2 mm increments to 49 mm. We reamed in  approximately 40 degrees of abduction, 20 degrees anteversion. A 50 mm  pinnacle acetabular shell was then impacted in anatomic position under  fluoroscopic guidance with excellent purchase. We did not need to place  any additional dome screws. A 32 mm neutral + 4 marathon liner was then  placed into the acetabular shell.       The femoral lift was then placed along the lateral aspect of the femur  just distal to the vastus ridge. The leg was  externally rotated and capsule  was stripped off the inferior aspect of the femoral neck down to the  level of the lesser trochanter, this was done with electrocautery. The femur was lifted after this was performed. The  leg was then placed in an extended and adducted position essentially delivering the femur. We also removed the capsule superiorly and the piriformis from the piriformis fossa to  gain excellent exposure of the  proximal femur. Rongeur was used to remove some cancellous bone to get  into the lateral portion of the proximal femur for placement of the  initial starter reamer. The starter broaches was placed  the starter broach  and was shown to go down the center of the canal. Broaching  with the Actis system was then performed starting at size 0  coursing  Up to size 6. A size 6 had excellent torsional and rotational  and axial stability. The trial standard offset neck was then  placed  with a 32 +5 trial head. The hip was then reduced. We confirmed that  the stem was in the canal both on AP and lateral x-rays. It also has excellent sizing. The hip was reduced with outstanding stability through full extension and full external rotation.. AP pelvis was taken and the leg lengths were measured and found to be equal. Hip was then dislocated again and the femoral head and neck removed. The  femoral broach was removed. Size 6 Actis stem with a standard offset  neck was then impacted into the femur following native anteversion. Has  excellent purchase in the canal. Excellent torsional and rotational and  axial stability. It is confirmed to be in the canal on AP and lateral  fluoroscopic views. The 32 + 5 ceramic head was placed and the hip  reduced with outstanding stability. Again AP pelvis was taken and it  confirmed that the leg lengths were equal. The wound was then copiously  irrigated with saline solution and the capsule reattached and repaired  with Ethibond suture. 30 ml of .25% Bupivicaine was  injected into the capsule and into the edge of the tensor fascia lata as well as subcutaneous tissue. The fascia overlying the tensor fascia lata was then closed with a running #1 V-Loc. Subcu was closed with interrupted 2-0 Vicryl and subcuticular running 4-0 Monocryl. Incision was cleaned  and dried. Steri-Strips and a bulky sterile dressing applied. The patient was awakened and transported to  recovery in stable condition.        Please note that a surgical assistant was a medical necessity for this procedure to perform it in a safe and expeditious manner. Assistant was necessary to provide appropriate retraction of vital neurovascular structures and to prevent femoral fracture and allow for anatomic placement of the prosthesis.  Ollen Gross, M.D.

## 2024-01-18 NOTE — Interval H&P Note (Signed)
 History and Physical Interval Note:  01/18/2024 8:37 AM  [name]  has presented today for surgery, with the diagnosis of right hip ostoarthritis.  The various methods of treatment have been discussed with the patient and family. After consideration of risks, benefits and other options for treatment, the patient has consented to  Procedure(s): ARTHROPLASTY, HIP, TOTAL, ANTERIOR APPROACH (Right) as a surgical intervention.  The patient's history has been reviewed, patient examined, no change in status, stable for surgery.  I have reviewed the patient's chart and labs.  Questions were answered to the patient's satisfaction.     Homero Fellers Emily Massar

## 2024-01-18 NOTE — Progress Notes (Signed)
 The patient complained of indigestion/gas mid epigastric area. Order placed Maalox 15 ml every 6 hours prn.

## 2024-01-18 NOTE — Anesthesia Preprocedure Evaluation (Addendum)
 Anesthesia Evaluation  Patient identified by MRN, date of birth, ID band Patient awake    Reviewed: Allergy & Precautions, NPO status , Patient's Chart, lab work & pertinent test results, reviewed documented beta blocker date and time   History of Anesthesia Complications Negative for: history of anesthetic complications  Airway Mallampati: III  TM Distance: >3 FB Neck ROM: Limited    Dental no notable dental hx.    Pulmonary neg COPD, former smoker, neg PE   breath sounds clear to auscultation       Cardiovascular hypertension, (-) CAD, (-) Past MI, (-) Cardiac Stents and (-) CABG  Rhythm:Regular Rate:Normal     Neuro/Psych neg Seizures PSYCHIATRIC DISORDERS Anxiety Depression       GI/Hepatic hiatal hernia,GERD  Medicated and Controlled,,(+) neg Cirrhosis      , Hepatitis -, C  Endo/Other  Hypothyroidism    Renal/GU CRFRenal disease     Musculoskeletal  (+) Arthritis , Osteoarthritis,    Abdominal   Peds  Hematology   Anesthesia Other Findings   Reproductive/Obstetrics                             Anesthesia Physical Anesthesia Plan  ASA: 2  Anesthesia Plan: Spinal   Post-op Pain Management:    Induction: Intravenous  PONV Risk Score and Plan: 2 and Ondansetron and Dexamethasone  Airway Management Planned: Video Laryngoscope Planned  Additional Equipment:   Intra-op Plan:   Post-operative Plan:   Informed Consent: I have reviewed the patients History and Physical, chart, labs and discussed the procedure including the risks, benefits and alternatives for the proposed anesthesia with the patient or authorized representative who has indicated his/her understanding and acceptance.     Dental advisory given  Plan Discussed with: CRNA  Anesthesia Plan Comments:         Anesthesia Quick Evaluation

## 2024-01-18 NOTE — Evaluation (Signed)
 Physical Therapy Evaluation Patient Details Name: [name] MRN: [id number] DOB: 1956/05/08 Today's Date: 01/18/2024  History of Present Illness  Pt is 68 yo female admitted on 01/18/24 for R anterior THA. Pt with hx including but not limited to HLD, GERD, lumbar spondylosis, chronic pain, CKD, arthritis, osteopenia, ACDF, lumbar surgery, spinal cord stimulator  Clinical Impression  Pt is s/p THA resulting in the deficits listed below (see PT Problem List). At baseline, pt independent and lives alone.  Her daughter will be with her till Sunday but then will only have intermittent support.  She needs a RW.  Today, pt in severe pain.  She has had all available meds and reports poor pain tolerance.  Pt willing to try mobility to see if another position is better.  Required mod A of 2 for bed mobility then ambulated 5' with RW and min A.  Positioned in recliner.  Pt still with 10/10 pains.  Encouraged relaxation techniques and applied ice.  Notified RN who is aware.  Hopeful for good progression as pain control improves. Pt will benefit from acute skilled PT to increase their independence and safety with mobility to facilitate discharge.          If plan is discharge home, recommend the following: A lot of help with walking and/or transfers;A lot of help with bathing/dressing/bathroom;Assistance with cooking/housework;Help with stairs or ramp for entrance   Can travel by private vehicle        Equipment Recommendations Rolling walker (2 wheels)  Recommendations for Other Services       Functional Status Assessment Patient has had a recent decline in their functional status and demonstrates the ability to make significant improvements in function in a reasonable and predictable amount of time.     Precautions / Restrictions Precautions Precautions: Fall Restrictions Weight Bearing Restrictions Per Provider Order: Yes RLE Weight Bearing Per Provider Order: Weight bearing as tolerated       Mobility  Bed Mobility Overal bed mobility: Needs Assistance Bed Mobility: Supine to Sit     Supine to sit: Mod assist, +2 for physical assistance     General bed mobility comments: Cues for sequence, gradual movements, mod A of 2 for pain control; cues for breathing    Transfers Overall transfer level: Needs assistance Equipment used: Rolling walker (2 wheels) Transfers: Sit to/from Stand Sit to Stand: Min assist, From elevated surface           General transfer comment: increased time, bed elevated for pain control    Ambulation/Gait Ambulation/Gait assistance: Min assist Gait Distance (Feet): 5 Feet Assistive device: Rolling walker (2 wheels) Gait Pattern/deviations: Step-to pattern, Decreased stride length, Shuffle Gait velocity: decreased     General Gait Details: Only a few steps , limited by pain, cues for relaxation and breathing  Stairs            Wheelchair Mobility     Tilt Bed    Modified Rankin (Stroke Patients Only)       Balance Overall balance assessment: Needs assistance Sitting-balance support: Bilateral upper extremity supported Sitting balance-Leahy Scale: Fair Sitting balance - Comments: Bil UE support but more for pressure / pain relief than balance   Standing balance support: Reliant on assistive device for balance, Bilateral upper extremity supported Standing balance-Leahy Scale: Poor Standing balance comment: Steady wtih RW  Pertinent Vitals/Pain Pain Assessment Pain Assessment: 0-10 Pain Score: 10-Worst pain ever Pain Location: R hip Pain Descriptors / Indicators: Discomfort Pain Intervention(s): Limited activity within patient's tolerance, Monitored during session, Premedicated before session, Repositioned, Relaxation, Utilized relaxation techniques, Ice applied    Home Living Family/patient expects to be discharged to:: Private residence Living Arrangements: Alone Available  Help at Discharge: Family;Other (Comment) (Daughter available 4 days then possibly sister) Type of Home: House Home Access: Ramped entrance;Stairs to enter   Entrance Stairs-Number of Steps: ramp to small threshold   Home Layout: One level;Other (Comment) (Does have 1 small step up from dining to kitchen) Home Equipment: Grab bars - tub/shower;Shower seat;BSC/3in1;Cane - single point      Prior Function Prior Level of Function : Independent/Modified Independent;Driving             Mobility Comments: Could ambulate in community ; did use cane ADLs Comments: independent adls and iadls     Extremity/Trunk Assessment   Upper Extremity Assessment Upper Extremity Assessment: Overall WFL for tasks assessed    Lower Extremity Assessment Lower Extremity Assessment: LLE deficits/detail;RLE deficits/detail RLE Deficits / Details: ROM: WFL; MMT ankle 3/5, knee 3/5, hip 1/5 ; limited by pain LLE Deficits / Details: ROM WFL; MMT at least 3/5 but not further tested due to pain    Cervical / Trunk Assessment Cervical / Trunk Assessment: Other exceptions Cervical / Trunk Exceptions: Hx back and neck surgery; has spinal cord stimulator  Communication        Cognition Arousal: Alert Behavior During Therapy: WFL for tasks assessed/performed   PT - Cognitive impairments: No apparent impairments                                 Cueing       General Comments General comments (skin integrity, edema, etc.): Cues for relaxation techniques.  Tried multiple positions in recliners with pillows for support and pain control.  Pt reports multiple sx in past and poor pain tolerance.  Daughter present and encouraging relaxtion techniques also  Educated on sleeping positions of comfort.  Daughter asking if pt would be able to stay alone with intermittent support starting Sunday - discussed difficult to determine at this time.     Exercises     Assessment/Plan    PT Assessment  Patient needs continued PT services  PT Problem List Decreased strength;Pain;Decreased range of motion;Decreased activity tolerance;Decreased knowledge of use of DME;Decreased balance;Decreased mobility       PT Treatment Interventions DME instruction;Therapeutic exercise;Gait training;Balance training;Stair training;Functional mobility training;Therapeutic activities;Patient/family education;Modalities    PT Goals (Current goals can be found in the Care Plan section)  Acute Rehab PT Goals Patient Stated Goal: return home; decrease pain PT Goal Formulation: With patient/family Time For Goal Achievement: 02/01/24 Potential to Achieve Goals: Good    Frequency 7X/week     Co-evaluation               AM-PAC PT "6 Clicks" Mobility  Outcome Measure Help needed turning from your back to your side while in a flat bed without using bedrails?: A Lot Help needed moving from lying on your back to sitting on the side of a flat bed without using bedrails?: A Lot Help needed moving to and from a bed to a chair (including a wheelchair)?: A Lot Help needed standing up from a chair using your arms (e.g., wheelchair or bedside chair)?: A Little Help  needed to walk in hospital room?: A Little Help needed climbing 3-5 steps with a railing? : A Lot 6 Click Score: 14    End of Session Equipment Utilized During Treatment: Gait belt Activity Tolerance: Patient limited by pain Patient left: with chair alarm set;in chair;with call bell/phone within reach;with family/visitor present;with SCD's reapplied Nurse Communication: Mobility status PT Visit Diagnosis: Other abnormalities of gait and mobility (R26.89);Muscle weakness (generalized) (M62.81)    Time: 6213-0865 PT Time Calculation (min) (ACUTE ONLY): 21 min   Charges:   PT Evaluation $PT Eval Low Complexity: 1 Low   PT General Charges $$ ACUTE PT VISIT: 1 Visit         Anise Salvo, PT Acute Rehab [facility]  [phone]   Rayetta Humphrey 01/18/2024, 4:59 PM

## 2024-01-18 NOTE — Plan of Care (Signed)

## 2024-01-18 NOTE — Discharge Instructions (Signed)
Frank Aluisio, MD Total Joint Specialist [facility] [address] [place], [place] [address] [phone]  ANTERIOR APPROACH TOTAL HIP REPLACEMENT POSTOPERATIVE DIRECTIONS     Hip Rehabilitation, Guidelines Following Surgery  The results of a hip operation are greatly improved after range of motion and muscle strengthening exercises. Follow all safety measures which are given to protect your hip. If any of these exercises cause increased pain or swelling in your joint, decrease the amount until you are comfortable again. Then slowly increase the exercises. Call your caregiver if you have problems or questions.   BLOOD CLOT PREVENTION Take an 81 mg Aspirin two times a day for three weeks following surgery. Then take an 81 mg Aspirin once a day for three weeks. Then discontinue Aspirin. You may resume your vitamins/supplements upon discharge from the hospital. Do not take any NSAIDs (Advil, Aleve, Ibuprofen, Meloxicam, etc.) until you are 3 weeks out from surgery  HOME CARE INSTRUCTIONS  Remove items at home which could result in a fall. This includes throw rugs or furniture in walking pathways.  ICE to the affected hip as frequently as 20-30 minutes an hour and then as needed for pain and swelling. Continue to use ice on the hip for pain and swelling from surgery. You may notice swelling that will progress down to the foot and ankle. This is normal after surgery. Elevate the leg when you are not up walking on it.   Continue to use the breathing machine which will help keep your temperature down.  It is common for your temperature to cycle up and down following surgery, especially at night when you are not up moving around and exerting yourself.  The breathing machine keeps your lungs expanded and your temperature down.  DIET You may resume your previous home diet once your are discharged from the hospital.  DRESSING / WOUND CARE / SHOWERING You have an  adhesive waterproof bandage over the incision. Leave this in place until your first follow-up appointment. Once you remove this you will not need to place another bandage.  You may begin showering 3 days following surgery, but do not submerge the incision under water.  ACTIVITY For the first 3-5 days, it is important to rest and keep the operative leg elevated. You should, as a general rule, rest for 50 minutes and walk/stretch for 10 minutes per hour. After 5 days, you may slowly increase activity as tolerated.  Perform the exercises you were provided twice a day for about 15-20 minutes each session. Begin these 2 days following surgery. Walk with your walker as instructed. Use the walker until you are comfortable transitioning to a cane. Walk with the cane in the opposite hand of the operative leg. You may discontinue the cane once you are comfortable and walking steadily. Avoid periods of inactivity such as sitting longer than an hour when not asleep. This helps prevent blood clots.  Do not drive a car for 6 weeks or until released by your surgeon.  Do not drive while taking narcotics.  TED HOSE STOCKINGS Wear the elastic stockings on both legs for three weeks following surgery during the day. You may remove them at night while sleeping.  WEIGHT BEARING Weight bearing as tolerated with assist device (walker, cane, etc) as directed, use it as long as suggested by your surgeon or therapist, typically at least 4-6 weeks.  POSTOPERATIVE CONSTIPATION PROTOCOL Constipation - defined medically as fewer than three stools per week and severe constipation as   less than one stool per week.  One of the most common issues patients have following surgery is constipation.  Even if you have a regular bowel pattern at home, your normal regimen is likely to be disrupted due to multiple reasons following surgery.  Combination of anesthesia, postoperative narcotics, change in appetite and fluid intake all can  affect your bowels.  In order to avoid complications following surgery, here are some recommendations in order to help you during your recovery period.  Colace (docusate) - Pick up an over-the-counter form of Colace or another stool softener and take twice a day as long as you are requiring postoperative pain medications.  Take with a full glass of water daily.  If you experience loose stools or diarrhea, hold the colace until you stool forms back up.  If your symptoms do not get better within 1 week or if they get worse, check with your doctor. Dulcolax (bisacodyl) - Pick up over-the-counter and take as directed by the product packaging as needed to assist with the movement of your bowels.  Take with a full glass of water.  Use this product as needed if not relieved by Colace only.  MiraLax (polyethylene glycol) - Pick up over-the-counter to have on hand.  MiraLax is a solution that will increase the amount of water in your bowels to assist with bowel movements.  Take as directed and can mix with a glass of water, juice, soda, coffee, or tea.  Take if you go more than two days without a movement.Do not use MiraLax more than once per day. Call your doctor if you are still constipated or irregular after using this medication for 7 days in a row.  If you continue to have problems with postoperative constipation, please contact the office for further assistance and recommendations.  If you experience "the worst abdominal pain ever" or develop nausea or vomiting, please contact the office immediatly for further recommendations for treatment.  ITCHING  If you experience itching with your medications, try taking only a single pain pill, or even half a pain pill at a time.  You can also use Benadryl over the counter for itching or also to help with sleep.   MEDICATIONS See your medication summary on the "After Visit Summary" that the nursing staff will review with you prior to discharge.  You may have some home  medications which will be placed on hold until you complete the course of blood thinner medication.  It is important for you to complete the blood thinner medication as prescribed by your surgeon.  Continue your approved medications as instructed at time of discharge.  PRECAUTIONS If you experience chest pain or shortness of breath - call 911 immediately for transfer to the hospital emergency department.  If you develop a fever greater that 101 F, purulent drainage from wound, increased redness or drainage from wound, foul odor from the wound/dressing, or calf pain - CONTACT YOUR SURGEON.                                                   FOLLOW-UP APPOINTMENTS Make sure you keep all of your appointments after your operation with your surgeon and caregivers. You should call the office at the above phone number and make an appointment for approximately two weeks after the date of your surgery or on the   date instructed by your surgeon outlined in the "After Visit Summary".  RANGE OF MOTION AND STRENGTHENING EXERCISES  These exercises are designed to help you keep full movement of your hip joint. Follow your caregiver's or physical therapist's instructions. Perform all exercises about fifteen times, three times per day or as directed. Exercise both hips, even if you have had only one joint replacement. These exercises can be done on a training (exercise) mat, on the floor, on a table or on a bed. Use whatever works the best and is most comfortable for you. Use music or television while you are exercising so that the exercises are a pleasant break in your day. This will make your life better with the exercises acting as a break in routine you can look forward to.  Lying on your back, slowly slide your foot toward your buttocks, raising your knee up off the floor. Then slowly slide your foot back down until your leg is straight again.  Lying on your back spread your legs as far apart as you can without causing  discomfort.  Lying on your side, raise your upper leg and foot straight up from the floor as far as is comfortable. Slowly lower the leg and repeat.  Lying on your back, tighten up the muscle in the front of your thigh (quadriceps muscles). You can do this by keeping your leg straight and trying to raise your heel off the floor. This helps strengthen the largest muscle supporting your knee.  Lying on your back, tighten up the muscles of your buttocks both with the legs straight and with the knee bent at a comfortable angle while keeping your heel on the floor.   POST-OPERATIVE OPIOID TAPER INSTRUCTIONS: It is important to wean off of your opioid medication as soon as possible. If you do not need pain medication after your surgery it is ok to stop day one. Opioids include: Codeine, Hydrocodone(Norco, Vicodin), Oxycodone(Percocet, oxycontin) and hydromorphone amongst others.  Long term and even short term use of opiods can cause: Increased pain response Dependence Constipation Depression Respiratory depression And more.  Withdrawal symptoms can include Flu like symptoms Nausea, vomiting And more Techniques to manage these symptoms Hydrate well Eat regular healthy meals Stay active Use relaxation techniques(deep breathing, meditating, yoga) Do Not substitute Alcohol to help with tapering If you have been on opioids for less than two weeks and do not have pain than it is ok to stop all together.  Plan to wean off of opioids This plan should start within one week post op of your joint replacement. Maintain the same interval or time between taking each dose and first decrease the dose.  Cut the total daily intake of opioids by one tablet each day Next start to increase the time between doses. The last dose that should be eliminated is the evening dose.   IF YOU ARE TRANSFERRED TO A SKILLED REHAB FACILITY If the patient is transferred to a skilled rehab facility following release from the  hospital, a list of the current medications will be sent to the facility for the patient to continue.  When discharged from the skilled rehab facility, please have the facility set up the patient's Home Health Physical Therapy prior to being released. Also, the skilled facility will be responsible for providing the patient with their medications at time of release from the facility to include their pain medication, the muscle relaxants, and their blood thinner medication. If the patient is still at the rehab facility   at time of the two week follow up appointment, the skilled rehab facility will also need to assist the patient in arranging follow up appointment in our office and any transportation needs.  MAKE SURE YOU:  Understand these instructions.  Get help right away if you are not doing well or get worse.    DENTAL ANTIBIOTICS:  In most cases prophylactic antibiotics for Dental procdeures after total joint surgery are not necessary.  Exceptions are as follows:  1. History of prior total joint infection  2. Severely immunocompromised (Organ Transplant, cancer chemotherapy, Rheumatoid biologic meds such as Humera)  3. Poorly controlled diabetes (A1C &gt; 8.0, blood glucose over 200)  If you have one of these conditions, contact your surgeon for an antibiotic prescription, prior to your dental procedure.    Pick up stool softner and laxative for home use following surgery while on pain medications. Do not submerge incision under water. Please use good hand washing techniques while changing dressing each day. May shower starting three days after surgery. Please use a clean towel to pat the incision dry following showers. Continue to use ice for pain and swelling after surgery. Do not use any lotions or creams on the incision until instructed by your surgeon.  

## 2024-01-19 ENCOUNTER — Encounter (HOSPITAL_COMMUNITY): Payer: Self-pay | Admitting: Orthopedic Surgery

## 2024-01-19 DIAGNOSIS — N183 Chronic kidney disease, stage 3 unspecified: Secondary | ICD-10-CM | POA: Diagnosis not present

## 2024-01-19 DIAGNOSIS — I129 Hypertensive chronic kidney disease with stage 1 through stage 4 chronic kidney disease, or unspecified chronic kidney disease: Secondary | ICD-10-CM | POA: Diagnosis not present

## 2024-01-19 DIAGNOSIS — M1611 Unilateral primary osteoarthritis, right hip: Secondary | ICD-10-CM | POA: Diagnosis not present

## 2024-01-19 DIAGNOSIS — E039 Hypothyroidism, unspecified: Secondary | ICD-10-CM | POA: Diagnosis not present

## 2024-01-19 DIAGNOSIS — Z87891 Personal history of nicotine dependence: Secondary | ICD-10-CM | POA: Diagnosis not present

## 2024-01-19 DIAGNOSIS — Z79899 Other long term (current) drug therapy: Secondary | ICD-10-CM | POA: Diagnosis not present

## 2024-01-19 LAB — BASIC METABOLIC PANEL WITH GFR
Anion gap: 5 (ref 5–15)
BUN: 14 mg/dL (ref 8–23)
CO2: 26 mmol/L (ref 22–32)
Calcium: 8.2 mg/dL — ABNORMAL LOW (ref 8.9–10.3)
Chloride: 101 mmol/L (ref 98–111)
Creatinine, Ser: 1.11 mg/dL — ABNORMAL HIGH (ref 0.44–1.00)
GFR, Estimated: 54 mL/min — ABNORMAL LOW (ref >60)
Glucose, Bld: 130 mg/dL — ABNORMAL HIGH (ref 70–99)
Potassium: 4 mmol/L (ref 3.5–5.1)
Sodium: 132 mmol/L — ABNORMAL LOW (ref 135–145)

## 2024-01-19 LAB — CBC
HCT: 29.5 % — ABNORMAL LOW (ref 36.0–46.0)
Hemoglobin: 9.5 g/dL — ABNORMAL LOW (ref 12.0–15.0)
MCH: 28.3 pg (ref 26.0–34.0)
MCHC: 32.2 g/dL (ref 30.0–36.0)
MCV: 87.8 fL (ref 80.0–100.0)
Platelets: 170 10*3/uL (ref 150–400)
RBC: 3.36 MIL/uL — ABNORMAL LOW (ref 3.87–5.11)
RDW: 13.3 % (ref 11.5–15.5)
WBC: 12.1 10*3/uL — ABNORMAL HIGH (ref 4.0–10.5)
nRBC: 0 % (ref 0.0–0.2)

## 2024-01-19 MED ORDER — HYDROMORPHONE HCL 2 MG PO TABS: 2.0000 mg | ORAL_TABLET | Freq: Four times a day (QID) | ORAL | 0 refills | Status: DC | PRN

## 2024-01-19 MED ORDER — ASPIRIN 81 MG PO CHEW: 81.0000 mg | CHEWABLE_TABLET | Freq: Two times a day (BID) | ORAL | 0 refills | Status: AC

## 2024-01-19 MED ORDER — ONDANSETRON HCL 4 MG PO TABS: 4.0000 mg | ORAL_TABLET | Freq: Four times a day (QID) | ORAL | 0 refills | Status: DC | PRN

## 2024-01-19 MED ORDER — DIPHENHYDRAMINE HCL 50 MG/ML IJ SOLN
25.0000 mg | Freq: Every evening | INTRAMUSCULAR | Status: DC | PRN
  Administered 2024-01-19: 25 mg via INTRAVENOUS
  Filled 2024-01-19: qty 1

## 2024-01-19 MED ORDER — CYCLOBENZAPRINE HCL 10 MG PO TABS: 10.0000 mg | ORAL_TABLET | Freq: Three times a day (TID) | ORAL | 0 refills | Status: DC | PRN

## 2024-01-19 NOTE — Progress Notes (Signed)
 Physical Therapy Treatment Patient Details Name: [name] MRN: [id number] DOB: December 15, 1955 Today's Date: 01/19/2024   History of Present Illness Pt is 68 yo female admitted on 01/18/24 for R anterior THA. Pt with hx including but not limited to HLD, GERD, lumbar spondylosis, chronic pain, CKD, arthritis, osteopenia, ACDF, lumbar surgery, spinal cord stimulator    PT Comments  Pt reports 7/10 pain with mobilizing.  Pt assisted with ambulating however only tolerated 20 ft due to pain.  Pt agreeable to remain OOB in recliner and encouraged to perform ankle pumps and use incentive spirometer.     If plan is discharge home, recommend the following: Assistance with cooking/housework;Help with stairs or ramp for entrance;A little help with walking and/or transfers;A little help with bathing/dressing/bathroom   Can travel by private vehicle        Equipment Recommendations  Rolling walker (2 wheels)    Recommendations for Other Services       Precautions / Restrictions Precautions Precautions: Fall Restrictions Weight Bearing Restrictions Per Provider Order: No RLE Weight Bearing Per Provider Order: Weight bearing as tolerated     Mobility  Bed Mobility Overal bed mobility: Needs Assistance Bed Mobility: Supine to Sit     Supine to sit: Contact guard, Used rails, HOB elevated     General bed mobility comments: verbal cues for self assist of R LE using gait belt, required increased time    Transfers Overall transfer level: Needs assistance Equipment used: Rolling walker (2 wheels) Transfers: Sit to/from Stand Sit to Stand: Contact guard assist           General transfer comment: verbal cues for UE and LE positioning for pain control    Ambulation/Gait Ambulation/Gait assistance: Contact guard assist Gait Distance (Feet): 20 Feet Assistive device: Rolling walker (2 wheels) Gait Pattern/deviations: Step-to pattern, Decreased stride length, Antalgic, Decreased stance  time - right Gait velocity: decreased     General Gait Details: verbal cues for sequence, step length, RW positioning, distance to tolerance which was limited by pain   Stairs             Wheelchair Mobility     Tilt Bed    Modified Rankin (Stroke Patients Only)       Balance                                            Communication Communication Communication: No apparent difficulties  Cognition Arousal: Alert Behavior During Therapy: WFL for tasks assessed/performed   PT - Cognitive impairments: No apparent impairments                                Cueing    Exercises      General Comments        Pertinent Vitals/Pain Pain Assessment Pain Assessment: 0-10 Pain Score: 7  Pain Location: R hip Pain Descriptors / Indicators: Discomfort, Burning Pain Intervention(s): Premedicated before session, Monitored during session, Repositioned, Ice applied    Home Living                          Prior Function            PT Goals (current goals can now be found in the care plan section) Progress towards PT goals: Progressing  toward goals    Frequency    7X/week      PT Plan      Co-evaluation              AM-PAC PT "6 Clicks" Mobility   Outcome Measure  Help needed turning from your back to your side while in a flat bed without using bedrails?: A Little Help needed moving from lying on your back to sitting on the side of a flat bed without using bedrails?: A Little Help needed moving to and from a bed to a chair (including a wheelchair)?: A Little Help needed standing up from a chair using your arms (e.g., wheelchair or bedside chair)?: A Little Help needed to walk in hospital room?: A Little Help needed climbing 3-5 steps with a railing? : A Lot 6 Click Score: 17    End of Session Equipment Utilized During Treatment: Gait belt Activity Tolerance: Patient limited by pain Patient left: with  chair alarm set;in chair;with call bell/phone within reach   PT Visit Diagnosis: Other abnormalities of gait and mobility (R26.89);Pain Pain - Right/Left: Right Pain - part of body: Hip     Time: 8413-2440 PT Time Calculation (min) (ACUTE ONLY): 14 min  Charges:    $Gait Training: 8-22 mins PT General Charges $$ ACUTE PT VISIT: 1 Visit                     Paulino Door, DPT Physical Therapist Acute Rehabilitation Services Office: [phone]    Janan Halter [place] 01/19/2024, 12:05 PM

## 2024-01-19 NOTE — Progress Notes (Signed)
   Subjective: 1 Day Post-Op Procedure(s) (LRB): ARTHROPLASTY, HIP, TOTAL, ANTERIOR APPROACH (Right) Patient seen in rounds by Dr. Lequita Halt. Patient is well, and has had no acute complaints or problems. Denies SOB or chest pain. Denies calf pain. Foley cath removed this AM. Patient reports pain as  10/10 although does not appear to be in distress . Motivated to go home. We will continue physical therapy today.   Objective: Vital signs in last 24 hours: Temp:  [97.5 F (36.4 C)-99.5 F (37.5 C)] 99.5 F (37.5 C) (04/03 0805) Pulse Rate:  [59-74] 68 (04/03 0805) Resp:  [13-24] 16 (04/03 0805) BP: (86-134)/(42-74) 121/54 (04/03 0805) SpO2:  [89 %-100 %] 98 % (04/03 0805) Weight:  [78 kg] 78 kg (04/02 0815)  Intake/Output from previous day:  Intake/Output Summary (Last 24 hours) at 01/19/2024 0810 Last data filed at 01/19/2024 0201 Gross per 24 hour  Intake 4968.75 ml  Output 4000 ml  Net 968.75 ml     Intake/Output this shift: No intake/output data recorded.  Labs: Recent Labs    01/19/24 0333  HGB 9.5*   Recent Labs    01/19/24 0333  WBC 12.1*  RBC 3.36*  HCT 29.5*  PLT 170   Recent Labs    01/19/24 0333  NA 132*  K 4.0  CL 101  CO2 26  BUN 14  CREATININE 1.11*  GLUCOSE 130*  CALCIUM 8.2*   No results for input(s): "LABPT", "INR" in the last 72 hours.  Exam: General - Patient is Alert and Oriented Extremity - Neurologically intact Neurovascular intact Sensation intact distally Dorsiflexion/Plantar flexion intact Dressing - dressing C/D/I Motor Function - intact, moving foot and toes well on exam.  Past Medical History:  Diagnosis Date   Anxiety    Arthritis    DDD (degenerative disc disease), lumbar    neck   Depression    GERD (gastroesophageal reflux disease)    Hepatitis    treated by  Dr. Brayton Caves physcians   History of bronchitis    History of hiatal hernia    History of kidney stones    Hyperlipidemia    Hypertension     Hypothyroidism    Osteoporosis 2025   forearm   Spinal cord stimulator status    placed 2006   Thyroid disease    takes synthroid    Assessment/Plan: 1 Day Post-Op Procedure(s) (LRB): ARTHROPLASTY, HIP, TOTAL, ANTERIOR APPROACH (Right) Principal Problem:   OA (osteoarthritis) of hip Active Problems:   Primary osteoarthritis of right hip  Estimated body mass index is 27.75 kg/m as calculated from the following:   Height as of this encounter: 5\' 6"  (1.676 m).   Weight as of this encounter: 78 kg. Advance diet Up with therapy D/C IV fluids  DVT Prophylaxis - Aspirin Weight bearing as tolerated.  Continue physical therapy. Expected discharge home today pending progress with physical therapy and if meeting patient goals. Will do HEP once discharged. Follow-up in clinic in 2 weeks.  The PDMP database was reviewed today prior to any opioid medications being prescribed to this patient.  R. Arcola Jansky, PA-C Orthopedic Surgery 01/19/2024, 8:10 AM

## 2024-01-19 NOTE — Discharge Summary (Signed)
 Physician Discharge Summary   Patient ID: [name] MRN: [id number] DOB/AGE: 1956-09-13 68 y.o.  Admit date: 01/18/2024 Discharge date: 01/19/2024  Primary Diagnosis: Right total hip arthroplasty, anterior approach   Admission Diagnoses:  Past Medical History:  Diagnosis Date   Anxiety    Arthritis    DDD (degenerative disc disease), lumbar    neck   Depression    GERD (gastroesophageal reflux disease)    Hepatitis    treated by  Dr. Brayton Caves physcians   History of bronchitis    History of hiatal hernia    History of kidney stones    Hyperlipidemia    Hypertension    Hypothyroidism    Osteoporosis 2025   forearm   Spinal cord stimulator status    placed 2006   Thyroid disease    takes synthroid   Discharge Diagnoses:   Principal Problem:   OA (osteoarthritis) of hip Active Problems:   Primary osteoarthritis of right hip  Estimated body mass index is 27.75 kg/m as calculated from the following:   Height as of this encounter: 5\' 6"  (1.676 m).   Weight as of this encounter: 78 kg.  Procedure:  Procedure(s) (LRB): ARTHROPLASTY, HIP, TOTAL, ANTERIOR APPROACH (Right)   Consults: None  HPI: [name] is a 68 y.o. female who has advanced end-stage arthritis of their Right  hip with progressively worsening pain and dysfunction.The patient has failed nonoperative management and presents for total hip arthroplasty.   Laboratory Data: Admission on 01/18/2024, Discharged on 01/19/2024  Component Date Value Ref Range Status   ABO/RH(D) 01/18/2024    Final                   Value:AB POS Performed at [facility], [address] [place], [place] [address]    WBC 01/19/2024 12.1 (H)  4.0 - 10.5 K/uL Final   RBC 01/19/2024 3.36 (L)  3.87 - 5.11 MIL/uL Final   Hemoglobin 01/19/2024 9.5 (L)  12.0 - 15.0 g/dL Final   HCT 25/36/6440 29.5 (L)  36.0 - 46.0 % Final   MCV 01/19/2024 87.8  80.0 - 100.0 fL Final   MCH 01/19/2024 28.3  26.0 - 34.0 pg  Final   MCHC 01/19/2024 32.2  30.0 - 36.0 g/dL Final   RDW 34/74/2595 13.3  11.5 - 15.5 % Final   Platelets 01/19/2024 170  150 - 400 K/uL Final   nRBC 01/19/2024 0.0  0.0 - 0.2 % Final   Performed at [facility], [address], [place], [place] [address]   Sodium 01/19/2024 132 (L)  135 - 145 mmol/L Final   Potassium 01/19/2024 4.0  3.5 - 5.1 mmol/L Final   Chloride 01/19/2024 101  98 - 111 mmol/L Final   CO2 01/19/2024 26  22 - 32 mmol/L Final   Glucose, Bld 01/19/2024 130 (H)  70 - 99 mg/dL Final   Glucose reference range applies only to samples taken after fasting for at least 8 hours.   BUN 01/19/2024 14  8 - 23 mg/dL Final   Creatinine, Ser 01/19/2024 1.11 (H)  0.44 - 1.00 mg/dL Final   Calcium 64/33/2951 8.2 (L)  8.9 - 10.3 mg/dL Final   GFR, Estimated 01/19/2024 54 (L)  >60 mL/min Final   Comment: (NOTE) Calculated using the CKD-EPI Creatinine Equation (2021)    Anion gap 01/19/2024 5  5 - 15 Final   Performed at [facility], [address] [place],  [place] [address]  Hospital Outpatient Visit on 01/05/2024  Component Date Value Ref Range Status   MRSA, PCR 01/05/2024 NEGATIVE  NEGATIVE Final   Staphylococcus aureus 01/05/2024 NEGATIVE  NEGATIVE Final   Comment: (NOTE) The Xpert SA Assay (FDA approved for NASAL specimens in patients 57 years of age and older), is one component of a comprehensive surveillance program. It is not intended to diagnose infection nor to guide or monitor treatment. Performed at [facility], [address], [place], [place] [address]    Sodium 01/05/2024 135  135 - 145 mmol/L Final   Potassium 01/05/2024 3.8  3.5 - 5.1 mmol/L Final   Chloride 01/05/2024 102  98 - 111 mmol/L Final   CO2 01/05/2024 24  22 - 32 mmol/L Final   Glucose, Bld 01/05/2024 94  70 - 99 mg/dL Final   Glucose reference range applies only to samples taken after fasting for at least 8 hours.   BUN 01/05/2024 21  8  - 23 mg/dL Final   Creatinine, Ser 01/05/2024 1.26 (H)  0.44 - 1.00 mg/dL Final   Calcium 13/24/4010 8.9  8.9 - 10.3 mg/dL Final   GFR, Estimated 01/05/2024 47 (L)  >60 mL/min Final   Comment: (NOTE) Calculated using the CKD-EPI Creatinine Equation (2021)    Anion gap 01/05/2024 9  5 - 15 Final   Performed at [facility], [address] [place], [place] [address]   WBC 01/05/2024 6.8  4.0 - 10.5 K/uL Final   RBC 01/05/2024 4.68  3.87 - 5.11 MIL/uL Final   Hemoglobin 01/05/2024 13.2  12.0 - 15.0 g/dL Final   HCT 66/44/0347 41.4  36.0 - 46.0 % Final   MCV 01/05/2024 88.5  80.0 - 100.0 fL Final   MCH 01/05/2024 28.2  26.0 - 34.0 pg Final   MCHC 01/05/2024 31.9  30.0 - 36.0 g/dL Final   RDW 42/59/5638 13.4  11.5 - 15.5 % Final   Platelets 01/05/2024 229  150 - 400 K/uL Final   nRBC 01/05/2024 0.0  0.0 - 0.2 % Final   Performed at [facility], [address], [place], [place] [address]   ABO/RH(D) 01/05/2024 AB POS   Final   Antibody Screen 01/05/2024 NEG   Final   Sample Expiration 01/05/2024 01/19/2024,2359   Final   Extend sample reason 01/05/2024    Final                   Value:NO TRANSFUSIONS OR PREGNANCY IN THE PAST 3 MONTHS Performed at [facility], [address] [place], [place] [address]      X-Rays:DG Pelvis Portable Result Date: 01/18/2024 CLINICAL DATA:  Right hip arthroplasty EXAM: PORTABLE PELVIS 1-2 VIEWS COMPARISON:  01/19/2023 FINDINGS: Frontal view the pelvis includes both hips. Right hip arthroplasty is identified in the expected position without evidence of acute complication. Postsurgical changes in the soft tissues overlying the right hip. Left hip is unremarkable. IMPRESSION: 1. Unremarkable right hip arthroplasty. Electronically Signed   By: Sharlet Salina M.D.   On: 01/18/2024 15:07   DG HIP UNILAT WITH PELVIS 1V RIGHT Result Date: 01/18/2024 CLINICAL DATA:  Right hip arthroplasty EXAM: DG HIP (WITH OR  WITHOUT PELVIS) 1V RIGHT COMPARISON:  01/19/2023 FINDINGS: 13 fluoroscopic images are obtained during the performance of the procedure and are provided for interpretation only. Images demonstrate placement of a right hip arthroplasty, in the expected position. Please refer to the operative report. Fluoroscopy time: 8  seconds, 1.23 mGy IMPRESSION: 1. Intraoperative evaluation.  Please refer to operative report. Electronically Signed   By: Sharlet Salina M.D.   On: 01/18/2024 15:07   DG C-Arm 1-60 Min-No Report Result Date: 01/18/2024 Fluoroscopy was utilized by the requesting physician.  No radiographic interpretation.   DG C-Arm 1-60 Min-No Report Result Date: 01/18/2024 Fluoroscopy was utilized by the requesting physician.  No radiographic interpretation.    EKG: Orders placed or performed in visit on 11/11/23   EKG 12-Lead     Hospital Course: [name] is a 68 y.o. who was admitted to [facility]. They were brought to the operating room on 01/18/2024 and underwent Procedure(s): ARTHROPLASTY, HIP, TOTAL, ANTERIOR APPROACH.  Patient tolerated the procedure well and was later transferred to the recovery room and then to the orthopaedic floor for postoperative care. They were given PO and IV analgesics for pain control following their surgery. They were given 24 hours of postoperative antibiotics of  Anti-infectives (From admission, onward)    Start     Dose/Rate Route Frequency Ordered Stop   01/18/24 1545  ceFAZolin (ANCEF) IVPB 2g/100 mL premix        2 g 200 mL/hr over 30 Minutes Intravenous Every 6 hours 01/18/24 1420 01/18/24 2230   01/18/24 0745  ceFAZolin (ANCEF) IVPB 2g/100 mL premix        2 g 200 mL/hr over 30 Minutes Intravenous On call to O.R. 01/18/24 6578 01/18/24 0941      and started on DVT prophylaxis in the form of Aspirin.   PT and OT were ordered for total joint protocol. Discharge planning consulted to help with postop disposition and equipment needs.  Patient  had a fair night on the evening of surgery. They started to get up OOB with therapy on POD #0. Pt was seen during rounds and was ready to go home pending progress with therapy. She worked with therapy on POD #1 and was meeting her goals. Pt was discharged to home later that day in stable condition.  Diet: Regular diet Activity: WBAT Follow-up: in 2 weeks Disposition: Home Discharged Condition: stable   Discharge Instructions     Call MD / Call 911   Complete by: As directed    If you experience chest pain or shortness of breath, CALL 911 and be transported to the hospital emergency room.  If you develope a fever above 101 F, pus (white drainage) or increased drainage or redness at the wound, or calf pain, call your surgeon's office.   Change dressing   Complete by: As directed    You have an adhesive waterproof bandage over the incision. Leave this in place until your first follow-up appointment. Once you remove this you will not need to place another bandage.   Constipation Prevention   Complete by: As directed    Drink plenty of fluids.  Prune juice may be helpful.  You may use a stool softener, such as Colace (over the counter) 100 mg twice a day.  Use MiraLax (over the counter) for constipation as needed.   Diet - low sodium heart healthy   Complete by: As directed    Do not sit on low chairs, stoools or toilet seats, as it may be difficult to get up from low surfaces   Complete by: As directed    Driving restrictions   Complete by: As directed    No driving for two weeks   Post-operative opioid taper instructions:   Complete by:  As directed    POST-OPERATIVE OPIOID TAPER INSTRUCTIONS: It is important to wean off of your opioid medication as soon as possible. If you do not need pain medication after your surgery it is ok to stop day one. Opioids include: Codeine, Hydrocodone(Norco, Vicodin), Oxycodone(Percocet, oxycontin) and hydromorphone amongst others.  Long term and even  short term use of opiods can cause: Increased pain response Dependence Constipation Depression Respiratory depression And more.  Withdrawal symptoms can include Flu like symptoms Nausea, vomiting And more Techniques to manage these symptoms Hydrate well Eat regular healthy meals Stay active Use relaxation techniques(deep breathing, meditating, yoga) Do Not substitute Alcohol to help with tapering If you have been on opioids for less than two weeks and do not have pain than it is ok to stop all together.  Plan to wean off of opioids This plan should start within one week post op of your joint replacement. Maintain the same interval or time between taking each dose and first decrease the dose.  Cut the total daily intake of opioids by one tablet each day Next start to increase the time between doses. The last dose that should be eliminated is the evening dose.      TED hose   Complete by: As directed    Use stockings (TED hose) for three weeks on both leg(s).  You may remove them at night for sleeping.   Weight bearing as tolerated   Complete by: As directed       Allergies as of 01/19/2024       Reactions   Iodinated Contrast Media Itching, Cough   Patient experienced itching of ears, coughing and scratchy throat after contrast injection.  Dr.Grady had patient observed for 10 minutes, patient discharged with symptoms resolved and no medicine administered.Dr Mosetta Putt instructed premeds prior to any future contrast study    Codeine Nausea Only   Imitrex [sumatriptan] Hives   "get hot and hyperventilate"   Morphine Rash        Medication List     TAKE these medications    ascorbic acid 500 MG tablet Commonly known as: VITAMIN C Take 500 mg by mouth daily.   aspirin 81 MG chewable tablet Chew 1 tablet (81 mg total) by mouth 2 (two) times daily for 20 days. Then take one 81 mg aspirin once a day for three weeks. Then discontinue aspirin. Start taking on: January 20, 2024    cyclobenzaprine 10 MG tablet Commonly known as: FLEXERIL Take 1 tablet (10 mg total) by mouth 3 (three) times daily as needed for muscle spasms.   escitalopram 10 MG tablet Commonly known as: LEXAPRO Take 10 mg by mouth daily.   HYDROmorphone 2 MG tablet Commonly known as: DILAUDID Take 1-2 tablets (2-4 mg total) by mouth every 6 (six) hours as needed for severe pain (pain score 7-10).   lamoTRIgine 150 MG tablet Commonly known as: LAMICTAL Take 150 mg by mouth daily.   levothyroxine 100 MCG tablet Commonly known as: SYNTHROID Take 100 mcg by mouth daily before breakfast.   lisinopril-hydrochlorothiazide 20-12.5 MG tablet Commonly known as: ZESTORETIC Take 0.5 tablets by mouth every morning.   ondansetron 4 MG tablet Commonly known as: ZOFRAN Take 1 tablet (4 mg total) by mouth every 6 (six) hours as needed for nausea.   Opcon-A 0.027-0.315 % Soln Generic drug: Naphazoline-Pheniramine Place 1 drop into both eyes as needed (eye allergies).   pantoprazole 40 MG tablet Commonly known as: PROTONIX Take 1 tablet (40 mg total)  by mouth daily. Annual appt is due must see provider for future refills   pravastatin 20 MG tablet Commonly known as: PRAVACHOL TAKE 1 TABLET EVERY DAY   rizatriptan 10 MG tablet Commonly known as: MAXALT Take 1 tablet earliest onset of migraine.  May repeat in 2 hours if needed.  Maximum 2 tablets in 24 hours.   traZODone 150 MG tablet Commonly known as: DESYREL TAKE 1 TABLET AT BEDTIME What changed: how much to take               Durable Medical Equipment  (From admission, onward)           Start     Ordered   01/19/24 1009  For home use only DME Walker rolling  Once       Question Answer Comment  Walker: With 5 Inch Wheels   Patient needs a walker to treat with the following condition S/P total hip arthroplasty      01/19/24 1009              Discharge Care Instructions  (From admission, onward)            Start     Ordered   01/19/24 0000  Weight bearing as tolerated        01/19/24 0812   01/19/24 0000  Change dressing       Comments: You have an adhesive waterproof bandage over the incision. Leave this in place until your first follow-up appointment. Once you remove this you will not need to place another bandage.   01/19/24 6578            Follow-up Information     Ollen Gross, MD. Go on 01/31/2024.   Specialty: Orthopedic Surgery Why: You have a post op appointment scheduled for Tuesday 01/31/24 at 2:30pm Contact information: [address] [place] [address] [phone]                 Signed: R. Arcola Jansky, PA-C Orthopedic Surgery 01/19/2024, 4:55 PM

## 2024-01-19 NOTE — Progress Notes (Signed)
 Physical Therapy Treatment Patient Details Name: [name] MRN: [id number] DOB: 1956/04/13 Today's Date: 01/19/2024   History of Present Illness Pt is 68 yo female admitted on 01/18/24 for R anterior THA. Pt with hx including but not limited to HLD, GERD, lumbar spondylosis, chronic pain, CKD, arthritis, osteopenia, ACDF, lumbar surgery, spinal cord stimulator    PT Comments  Pt ambulated and practiced one step.  Daughter present this afternoon and observed.  Pt and daughter educated on HEP with handout provided and discussed progression.  Pt and daughter report understanding, and pt feels ready for d/c home today.     If plan is discharge home, recommend the following: Assistance with cooking/housework;Help with stairs or ramp for entrance;A little help with walking and/or transfers;A little help with bathing/dressing/bathroom   Can travel by private vehicle        Equipment Recommendations  Rolling walker (2 wheels)    Recommendations for Other Services       Precautions / Restrictions Precautions Precautions: Fall Restrictions RLE Weight Bearing Per Provider Order: Weight bearing as tolerated     Mobility  Bed Mobility Overal bed mobility: Needs Assistance Bed Mobility: Supine to Sit     Supine to sit: Contact guard, Used rails, HOB elevated     General bed mobility comments: pt in recliner    Transfers Overall transfer level: Needs assistance Equipment used: Rolling walker (2 wheels) Transfers: Sit to/from Stand Sit to Stand: Contact guard assist           General transfer comment: verbal cues for UE and LE positioning for pain control    Ambulation/Gait Ambulation/Gait assistance: Contact guard assist Gait Distance (Feet): 50 Feet Assistive device: Rolling walker (2 wheels) Gait Pattern/deviations: Step-to pattern, Decreased stride length, Antalgic, Decreased stance time - right Gait velocity: decreased     General Gait Details: verbal cues for  sequence, step length, RW positioning, distance to tolerance   Stairs Stairs: Yes Stairs assistance: Contact guard assist Stair Management: Step to pattern, Forwards, With walker Number of Stairs: 1 General stair comments: has one step in home (also a threshold to get inside); verbal cues for safety, sequence, RW positioning; pt performed twice and daughter observed   Wheelchair Mobility     Tilt Bed    Modified Rankin (Stroke Patients Only)       Balance                                            Communication Communication Communication: No apparent difficulties  Cognition Arousal: Alert Behavior During Therapy: WFL for tasks assessed/performed   PT - Cognitive impairments: No apparent impairments                                Cueing    Exercises Total Joint Exercises Ankle Circles/Pumps: AROM, Both, 10 reps Quad Sets: AROM, Both, 10 reps Heel Slides: AAROM, Right, 10 reps, Supine Hip ABduction/ADduction: AAROM, Right, 10 reps, Supine Long Arc Quad: AROM, Right, Seated, 10 reps    General Comments        Pertinent Vitals/Pain Pain Assessment Pain Assessment: 0-10 Pain Score: 7  Pain Location: R hip Pain Descriptors / Indicators: Discomfort, Burning Pain Intervention(s): Monitored during session, Repositioned, Premedicated before session    Home Living  Prior Function            PT Goals (current goals can now be found in the care plan section) Progress towards PT goals: Progressing toward goals    Frequency    7X/week      PT Plan      Co-evaluation              AM-PAC PT "6 Clicks" Mobility   Outcome Measure  Help needed turning from your back to your side while in a flat bed without using bedrails?: A Little Help needed moving from lying on your back to sitting on the side of a flat bed without using bedrails?: A Little Help needed moving to and from a bed to a  chair (including a wheelchair)?: A Little Help needed standing up from a chair using your arms (e.g., wheelchair or bedside chair)?: A Little Help needed to walk in hospital room?: A Little Help needed climbing 3-5 steps with a railing? : A Little 6 Click Score: 18    End of Session Equipment Utilized During Treatment: Gait belt Activity Tolerance: Patient tolerated treatment well Patient left: in chair;with call bell/phone within reach;with chair alarm set;with family/visitor present Nurse Communication: Mobility status PT Visit Diagnosis: Other abnormalities of gait and mobility (R26.89);Pain Pain - Right/Left: Right Pain - part of body: Hip     Time: 1350-1411 PT Time Calculation (min) (ACUTE ONLY): 21 min  Charges:    $Gait Training: 8-22 mins PT General Charges $$ ACUTE PT VISIT: 1 Visit                    Paulino Door, DPT Physical Therapist Acute Rehabilitation Services Office: [phone]    Janan Halter Payson 01/19/2024, 4:40 PM

## 2024-01-19 NOTE — Care Management Obs Status (Signed)
 MEDICARE OBSERVATION STATUS NOTIFICATION   Patient Details  Name: [name] MRN: [id number] Date of Birth: 1956/05/20   Medicare Observation Status Notification Given:  Yes    Howell Rucks, RN 01/19/2024, 9:30 AM

## 2024-01-19 NOTE — TOC Transition Note (Signed)
 Transition of Care [facility]) - Discharge Note   Patient Details  Name: [name] MRN: [id number] Date of Birth: 08-23-1956  Transition of Care [facility]) CM/SW Contact:  Howell Rucks, RN Phone Number: 01/19/2024, 9:53 AM   Clinical Narrative:   Met with pt at bedside to review dc therapy and home equipment needs, pt confirmed HEP. [employer] to deliver RW to bedside. No further TOC needs.     Final next level of care: Home/Self Care Barriers to Discharge: No Barriers Identified   Patient Goals and CMS Choice Patient states their goals for this hospitalization and ongoing recovery are:: return home          Discharge Placement                       Discharge Plan and Services Additional resources added to the After Visit Summary for                  DME Arranged: Walker rolling DME Agency: [employer]                  Social Drivers of Health (SDOH) Interventions SDOH Screenings   Food Insecurity: No Food Insecurity (01/18/2024)  Housing: Low Risk  (01/18/2024)  Transportation Needs: No Transportation Needs (01/18/2024)  Utilities: Not At Risk (01/18/2024)  Alcohol Screen: Low Risk  (11/07/2023)  Depression (PHQ2-9): Low Risk  (05/24/2023)  Financial Resource Strain: Patient Declined (11/07/2023)  Physical Activity: Unknown (11/07/2023)  Social Connections: Unknown (01/18/2024)  Stress: Stress Concern Present (11/07/2023)  Tobacco Use: Medium Risk (01/18/2024)     Readmission Risk Interventions     No data to display

## 2024-01-20 ENCOUNTER — Other Ambulatory Visit: Payer: Self-pay | Admitting: Internal Medicine

## 2024-01-20 NOTE — Addendum Note (Signed)
 Addendum  created 01/20/24 1055 by Randon Goldsmith, CRNA   Intraprocedure Staff edited

## 2024-02-15 ENCOUNTER — Encounter: Payer: Self-pay | Admitting: Internal Medicine

## 2024-02-15 ENCOUNTER — Ambulatory Visit (INDEPENDENT_AMBULATORY_CARE_PROVIDER_SITE_OTHER): Payer: Medicare HMO | Admitting: Internal Medicine

## 2024-02-15 VITALS — BP 124/80 | HR 80 | Temp 98.0°F | Ht 66.0 in | Wt 174.0 lb

## 2024-02-15 DIAGNOSIS — N1831 Chronic kidney disease, stage 3a: Secondary | ICD-10-CM

## 2024-02-15 DIAGNOSIS — I10 Essential (primary) hypertension: Secondary | ICD-10-CM

## 2024-02-15 DIAGNOSIS — Z0000 Encounter for general adult medical examination without abnormal findings: Secondary | ICD-10-CM | POA: Diagnosis not present

## 2024-02-15 LAB — COMPREHENSIVE METABOLIC PANEL WITH GFR
ALT: 10 U/L (ref 0–35)
AST: 10 U/L (ref 0–37)
Albumin: 4.4 g/dL (ref 3.5–5.2)
Alkaline Phosphatase: 86 U/L (ref 39–117)
BUN: 19 mg/dL (ref 6–23)
CO2: 28 meq/L (ref 19–32)
Calcium: 9.4 mg/dL (ref 8.4–10.5)
Chloride: 101 meq/L (ref 96–112)
Creatinine, Ser: 1.23 mg/dL — ABNORMAL HIGH (ref 0.40–1.20)
GFR: 45.46 mL/min — ABNORMAL LOW (ref >60.00)
Glucose, Bld: 103 mg/dL — ABNORMAL HIGH (ref 70–99)
Potassium: 4.4 meq/L (ref 3.5–5.1)
Sodium: 137 meq/L (ref 135–145)
Total Bilirubin: 0.4 mg/dL (ref 0.2–1.2)
Total Protein: 7 g/dL (ref 6.0–8.3)

## 2024-02-15 LAB — CBC
HCT: 35.1 % — ABNORMAL LOW (ref 36.0–46.0)
Hemoglobin: 11.5 g/dL — ABNORMAL LOW (ref 12.0–15.0)
MCHC: 32.9 g/dL (ref 30.0–36.0)
MCV: 83.5 fl (ref 78.0–100.0)
Platelets: 307 10*3/uL (ref 150.0–400.0)
RBC: 4.21 Mil/uL (ref 3.87–5.11)
RDW: 14.7 % (ref 11.5–15.5)
WBC: 10.6 10*3/uL — ABNORMAL HIGH (ref 4.0–10.5)

## 2024-02-15 LAB — LIPID PANEL
Cholesterol: 218 mg/dL — ABNORMAL HIGH (ref 0–200)
HDL: 71.1 mg/dL (ref >39.00)
LDL Cholesterol: 136 mg/dL — ABNORMAL HIGH (ref 0–99)
NonHDL: 146.93
Total CHOL/HDL Ratio: 3
Triglycerides: 54 mg/dL (ref 0.0–149.0)
VLDL: 10.8 mg/dL (ref 0.0–40.0)

## 2024-02-15 LAB — VITAMIN D 25 HYDROXY (VIT D DEFICIENCY, FRACTURES): VITD: 34.91 ng/mL (ref 30.00–100.00)

## 2024-02-15 NOTE — Assessment & Plan Note (Signed)
 Flu shot yearly. Pneumonia complete. Shingrix complete. Tetanus due at pharmacy. Colonoscopy up to date. Mammogram up to date, pap smear aged out and dexa complete. Counseled about sun safety and mole surveillance. Counseled about the dangers of distracted driving. Given 10 year screening recommendations.

## 2024-02-15 NOTE — Progress Notes (Signed)
   Subjective:   Patient ID: [name], female    DOB: 1956-09-04, 68 y.o.   MRN: [id number]  HPI The patient is here for physical.  PMH, [facility], social history reviewed and updated  Review of Systems  Constitutional: Negative.   HENT: Negative.    Eyes: Negative.   Respiratory:  Negative for cough, chest tightness and shortness of breath.   Cardiovascular:  Negative for chest pain, palpitations and leg swelling.  Gastrointestinal:  Negative for abdominal distention, abdominal pain, constipation, diarrhea, nausea and vomiting.  Musculoskeletal:  Positive for arthralgias and back pain.  Skin: Negative.   Neurological: Negative.   Psychiatric/Behavioral: Negative.      Objective:  Physical Exam Constitutional:      Appearance: She is well-developed.  HENT:     Head: Normocephalic and atraumatic.  Cardiovascular:     Rate and Rhythm: Normal rate and regular rhythm.  Pulmonary:     Effort: Pulmonary effort is normal. No respiratory distress.     Breath sounds: Normal breath sounds. No wheezing or rales.  Abdominal:     General: Bowel sounds are normal. There is no distension.     Palpations: Abdomen is soft.     Tenderness: There is no abdominal tenderness. There is no rebound.  Musculoskeletal:        General: Tenderness present.     Cervical back: Normal range of motion.  Skin:    General: Skin is warm and dry.  Neurological:     Mental Status: She is alert and oriented to person, place, and time.     Coordination: Coordination normal.     Vitals:   02/15/24 1042  BP: 124/80  Pulse: 80  Temp: 98 F (36.7 C)  TempSrc: Oral  SpO2: 96%  Weight: 174 lb (78.9 kg)  Height: 5\' 6"  (1.676 m)    Assessment & Plan:

## 2024-02-15 NOTE — Assessment & Plan Note (Signed)
 Counseled and reviewed trends. She is counseled about NSAIDS. Checking CMP.

## 2024-02-15 NOTE — Assessment & Plan Note (Signed)
 BP at goal on lisinopril /hydrochlorothiazide  and checking CMP and lipid panel.

## 2024-02-16 DIAGNOSIS — Z4789 Encounter for other orthopedic aftercare: Secondary | ICD-10-CM | POA: Diagnosis not present

## 2024-02-17 ENCOUNTER — Encounter: Payer: Self-pay | Admitting: Internal Medicine

## 2024-02-17 DIAGNOSIS — M25551 Pain in right hip: Secondary | ICD-10-CM | POA: Diagnosis not present

## 2024-02-23 ENCOUNTER — Other Ambulatory Visit: Payer: Self-pay | Admitting: Internal Medicine

## 2024-03-21 DIAGNOSIS — L239 Allergic contact dermatitis, unspecified cause: Secondary | ICD-10-CM | POA: Diagnosis not present

## 2024-04-03 DIAGNOSIS — M7061 Trochanteric bursitis, right hip: Secondary | ICD-10-CM | POA: Diagnosis not present

## 2024-04-03 DIAGNOSIS — Z4789 Encounter for other orthopedic aftercare: Secondary | ICD-10-CM | POA: Diagnosis not present

## 2024-04-03 DIAGNOSIS — G8918 Other acute postprocedural pain: Secondary | ICD-10-CM | POA: Diagnosis not present

## 2024-04-04 ENCOUNTER — Ambulatory Visit (INDEPENDENT_AMBULATORY_CARE_PROVIDER_SITE_OTHER)

## 2024-04-04 VITALS — Ht 66.0 in | Wt 174.0 lb

## 2024-04-04 DIAGNOSIS — Z0000 Encounter for general adult medical examination without abnormal findings: Secondary | ICD-10-CM

## 2024-04-04 NOTE — Progress Notes (Signed)
 Subjective:   [name] is a 68 y.o. who presents for a Medicare Wellness preventive visit.  As a reminder, Annual Wellness Visits don't include a physical exam, and some assessments may be limited, especially if this visit is performed virtually. We may recommend an in-person follow-up visit with your provider if needed.  Visit Complete: Virtual I connected with  [name] on 04/04/24 by a audio enabled telemedicine application and verified that I am speaking with the correct person using two identifiers.  Patient Location: Home  Provider Location: Home Office  I discussed the limitations of evaluation and management by telemedicine. The patient expressed understanding and agreed to proceed.  Vital Signs: Because this visit was a virtual/telehealth visit, some criteria may be missing or patient reported. Any vitals not documented were not able to be obtained and vitals that have been documented are patient reported.  VideoDeclined- This patient declined Librarian, academic. Therefore the visit was completed with audio only.  Persons Participating in Visit: Patient.  AWV Questionnaire: Yes: Patient Medicare AWV questionnaire was completed by the patient on 04/02/2024; I have confirmed that all information answered by patient is correct and no changes since this date.  Cardiac Risk Factors include: advanced age (>12men, >21 women);hypertension;Other (see comment), Risk factor comments: CKD     Objective:    Today's Vitals   04/04/24 1136  Weight: 174 lb (78.9 kg)  Height: 5' 6 (1.676 m)   Body mass index is 28.08 kg/m.     04/04/2024   11:46 AM 01/18/2024    8:15 AM 01/05/2024   11:15 AM 11/01/2022    1:30 PM 10/27/2021    1:09 PM 12/10/2020   10:53 AM 06/09/2020   12:48 PM  Advanced Directives  Does Patient Have a Medical Advance Directive? Yes Yes Yes Yes Yes Yes Yes  Type of Estate agent of [place];Living will  Healthcare Power of [place];Living will Healthcare Power of [place];Living will Healthcare Power of [place];Living will  Healthcare Power of [place];Living will Healthcare Power of [place];Living will  Does patient want to make changes to medical advance directive?  No - Patient declined No - Patient declined      Copy of Healthcare Power of Attorney in Chart? No - copy requested No - copy requested No - copy requested        Current Medications (verified) Outpatient Encounter Medications as of 04/04/2024  Medication Sig   escitalopram  (LEXAPRO ) 10 MG tablet Take 10 mg by mouth daily.   gabapentin  (NEURONTIN ) 300 MG capsule Take 300 mg by mouth 2 (two) times daily.   lamoTRIgine  (LAMICTAL ) 150 MG tablet Take 150 mg by mouth daily.   levothyroxine  (SYNTHROID ) 100 MCG tablet Take 100 mcg by mouth daily before breakfast.   lisinopril -hydrochlorothiazide  (ZESTORETIC ) 20-12.5 MG tablet TAKE 1/2 TABLET EVERY MORNING   pantoprazole  (PROTONIX ) 40 MG tablet TAKE 1 TABLET EVERY DAY (NEED MD APPOINTMENT FOR REFILLS)   pravastatin  (PRAVACHOL ) 20 MG tablet TAKE 1 TABLET EVERY DAY   rizatriptan  (MAXALT ) 10 MG tablet Take 1 tablet earliest onset of migraine.  May repeat in 2 hours if needed.  Maximum 2 tablets in 24 hours.   traZODone  (DESYREL ) 150 MG tablet TAKE 1 TABLET AT BEDTIME   VALIUM  5 MG tablet Take 5 mg by mouth every 6 (six) hours as needed for muscle spasms.   ascorbic acid (VITAMIN C) 500 MG tablet Take 500 mg by mouth daily.   cyclobenzaprine  (FLEXERIL ) 10 MG  tablet Take 1 tablet (10 mg total) by mouth 3 (three) times daily as needed for muscle spasms. (Patient not taking: Reported on 04/04/2024)   HYDROmorphone  (DILAUDID ) 2 MG tablet Take 1-2 tablets (2-4 mg total) by mouth every 6 (six) hours as needed for severe pain (pain score 7-10).   methylPREDNISolone  (MEDROL  DOSEPAK) 4 MG TBPK tablet Take by mouth as directed.   Naphazoline-Pheniramine (OPCON-A) 0.027-0.315 % SOLN Place 1 drop into  both eyes as needed (eye allergies). (Patient not taking: Reported on 04/04/2024)   ondansetron  (ZOFRAN ) 4 MG tablet Take 1 tablet (4 mg total) by mouth every 6 (six) hours as needed for nausea.   No facility-administered encounter medications on file as of 04/04/2024.    Allergies (verified) Iodinated contrast media, Codeine, Imitrex  [sumatriptan ], and Morphine    History: Past Medical History:  Diagnosis Date   Allergy 1979   dust mites, mold,pollen   Anxiety    Arthritis    Cataract Will have surgery in May 2024   DDD (degenerative disc disease), lumbar    neck   Depression    GERD (gastroesophageal reflux disease)    Hepatitis    treated by  Dr. Glenora Laos physcians   History of bronchitis    History of hiatal hernia    History of kidney stones    Hyperlipidemia    Hypertension    Hypothyroidism    Osteoporosis 2025   forearm   Spinal cord stimulator status    placed 2006   Thyroid  disease    takes synthroid    Past Surgical History:  Procedure Laterality Date   Anterior cervical decompression and fusion with a reflex hyper plate  16/07/9603   C4-C5 and C5-C6 , Dr. Melven Stable. [place]   BREAST EXCISIONAL BIOPSY Left 1982   BREAST SURGERY  1983   left breast cyst removed   cervical fusion w/OASIS system infuse  05/13/2006   C4-C5 and C5-C6 and C6-7,  Dr. Signe Dries   CHOLECYSTECTOMY  ?2002   CHOLECYSTECTOMY  2002   COLONOSCOPY     ESOPHAGEAL DILATION     Dr. Sabra Cramp   KNEE SURGERY  06/2017   torn meniscus   LAPAROSCOPIC ENDOMETRIOSIS FULGURATION  1980's   Dr. Karel Osler   LIVER BIOPSY  2002   LOWER BACK SURGERY  07/2015   LUMBAR DISC SURGERY  08/13/2003   Diskectomy/fusion L5-S1 rt., Dr. Signe Dries   lumbar hemilaminectomy  01/1989   L5-S1 on right, Dr. Fabian Holster   NASAL SINUS SURGERY  10/30/2007   Left upper Sinus lift,  Dr. Rickard Charles   SHOULDER ARTHROSCOPY WITH SUBACROMIAL DECOMPRESSION Right 12/06/2014   Procedure: SHOULDER ARTHROSCOPY WITH SUBACROMIAL DECOMPRESSION; EXCISION BONE  SPUR;  Surgeon: Ronn Cohn, MD;  Location: [facility];  Service: Orthopedics;  Laterality: Right;   SHOULDER SURGERY Right 12/06/2014   cleaned up bone spurs   SPINAL CORD STIMULATOR INSERTION  12/29/2004   leads placed along spine from T8 to T10, Dr. Melven Stable. [place]   Spinal cord stimulator pulse generator  01/05/2005   placed in right upper buttocks, Dr. Melven Stable. Joanette Moynahan   SPINE SURGERY  1992 to 2016   THYROID  LOBECTOMY Right 02/13/2016   THYROIDECTOMY Right 02/13/2016   Procedure: RIGHT THYROID  LOBECTOMY WITH FROZEN SECTION ;  Surgeon: Janita Mellow, MD;  Location: [facility] OR;  Service: ENT;  Laterality: Right;   TONSILLECTOMY     TOTAL HIP ARTHROPLASTY Right 01/18/2024   Procedure: ARTHROPLASTY, HIP, TOTAL, ANTERIOR APPROACH;  Surgeon: Liliane Rei, MD;  Location: [facility];  Service: Orthopedics;  Laterality: Right;   TUBAL LIGATION  1983   Family History  Problem Relation Age of Onset   Hypertension Father    Cancer Father        colon, liver   Atrial fibrillation Father    Arthritis Father    COPD Father    Hypertension Sister    Anxiety disorder Sister    Depression Sister    Hearing loss Sister    Kidney disease Sister    Varicose Veins Sister    Hypertension Brother    Cancer Brother        lung, adrenal glands and brain   Early death Brother    Hypertension Brother    Aneurysm Mother        brain   Stroke Mother 29   Alcohol  abuse Mother    Early death Mother    Alcohol  abuse Brother    Anxiety disorder Brother    Depression Brother    Drug abuse Brother    Hypertension Brother    Social History   Socioeconomic History   Marital status: Widowed    Spouse name: Not on file   Number of children: Not on file   Years of education: Not on file   Highest education level: 12th grade  Occupational History   Occupation: RETIRED  Tobacco Use   Smoking status: Former    Current packs/day: 0.00    Average packs/day: 0.5 packs/day for 15.0 years (7.5 ttl pk-yrs)     Types: Cigarettes    Quit date: 34    Years since quitting: 33.4   Smokeless tobacco: Never   Tobacco comments:    quit smoking in 1992  Vaping Use   Vaping status: Never Used  Substance and Sexual Activity   Alcohol  use: Yes    Alcohol /week: 3.0 standard drinks of alcohol     Types: 3 Standard drinks or equivalent per week    Comment: Maybe 3 drinks a month   Drug use: No   Sexual activity: Not Currently    Birth control/protection: Post-menopausal  Other Topics Concern   Not on file  Social History Narrative   Walks the dog exercise.  Has to stop breath while walking the dog.    Drinks about 3 caffeine drinks a week    Currently disabled after 8 back surgeries with spinal cord/injury   Right handed   Lives alone; one story home/2025   Social Drivers of Health   Financial Resource Strain: Low Risk  (04/02/2024)   Overall Financial Resource Strain (CARDIA)    Difficulty of Paying Living Expenses: Not hard at all  Food Insecurity: No Food Insecurity (04/02/2024)   Hunger Vital Sign    Worried About Running Out of Food in the Last Year: Never true    Ran Out of Food in the Last Year: Never true  Transportation Needs: No Transportation Needs (04/02/2024)   PRAPARE - Administrator, Civil Service (Medical): No    Lack of Transportation (Non-Medical): No  Physical Activity: Inactive (04/02/2024)   Exercise Vital Sign    Days of Exercise per Week: 0 days    Minutes of Exercise per Session: Not on file  Stress: No Stress Concern Present (04/02/2024)   [employer] of Occupational Health - Occupational Stress Questionnaire    Feeling of Stress: Not at all  Social Connections: Socially Isolated (04/02/2024)   Social Connection and Isolation Panel    Frequency of Communication  with Friends and Family: Once a week    Frequency of Social Gatherings with Friends and Family: Once a week    Attends Religious Services: Never    Database administrator or Organizations: No     Attends Engineer, structural: Not on file    Marital Status: Widowed    Tobacco Counseling Counseling given: Not Answered Tobacco comments: quit smoking in 1992    Clinical Intake:  Pre-visit preparation completed: Yes  Pain : No/denies pain (some issues with hip replacement)     BMI - recorded: 28.08 Nutritional Status: BMI 25 -29 Overweight Nutritional Risks: None  Lab Results  Component Value Date   HGBA1C 5.5 11/22/2022     How often do you need to have someone help you when you read instructions, pamphlets, or other written materials from your doctor or pharmacy?: 1 - Never  Interpreter Needed?: No  Information entered by :: Ugochukwu Chichester, RMA   Activities of Daily Living     04/02/2024   11:04 AM 01/18/2024    2:20 PM  In your present state of health, do you have any difficulty performing the following activities:  Hearing? 0   Vision? 0   Difficulty concentrating or making decisions? 0   Walking or climbing stairs? 1   Dressing or bathing? 0   Doing errands, shopping? 0 1  Preparing Food and eating ? N   Using the Toilet? N   In the past six months, have you accidently leaked urine? N   Do you have problems with loss of bowel control? N   Managing your Medications? N   Managing your Finances? N   Housekeeping or managing your Housekeeping? N     Patient Care Team: Adelia Homestead, MD as PCP - General (Internal Medicine) Arleen Lacer, MD as PCP - Cardiology (Cardiology) Tisovec, Kristina Pfeiffer, MD (Internal Medicine) Merriam Abbey, DO as Consulting Physician (Neurology) Jorie Newness, Blondie Burke, MD as Consulting Physician (Obstetrics and Gynecology)  I have updated your Care Teams any recent Medical Services you may have received from other providers in the past year.     Assessment:   This is a routine wellness examination for [place].  Hearing/Vision screen Hearing Screening - Comments:: Denies hearing difficulties   Vision  Screening - Comments:: Wears eyeglasses/Dr. Rachel Budds   Goals Addressed               This Visit's Progress     Patient Stated (pt-stated)        To get hip completely healed/better/2025       Depression Screen     04/04/2024   11:48 AM 05/24/2023   10:58 AM 02/14/2023   11:10 AM  PHQ 2/9 Scores  PHQ - 2 Score 3 0 0  PHQ- 9 Score 4 0     Fall Risk     04/02/2024   11:04 AM 02/15/2024   10:49 AM 11/08/2023    1:37 PM 02/14/2023   11:10 AM 11/01/2022    1:30 PM  Fall Risk   Falls in the past year? 0 0 0 1 1  Number falls in past yr:  0 0 0 1  Injury with Fall?  0 0 0 0  Risk for fall due to :    No Fall Risks   Follow up Falls evaluation completed;Falls prevention discussed Falls evaluation completed Falls evaluation completed Falls evaluation completed Falls evaluation completed      Data saved  with a previous flowsheet row definition    MEDICARE RISK AT HOME:  Medicare Risk at Home Any stairs in or around the home?: (Patient-Rptd) Yes If so, are there any without handrails?: (Patient-Rptd) No Home free of loose throw rugs in walkways, pet beds, electrical cords, etc?: (Patient-Rptd) Yes Adequate lighting in your home to reduce risk of falls?: (Patient-Rptd) Yes Life alert?: (Patient-Rptd) No Use of a cane, walker or w/c?: (Patient-Rptd) Yes Grab bars in the bathroom?: (Patient-Rptd) Yes Shower chair or bench in shower?: (Patient-Rptd) No Elevated toilet seat or a handicapped toilet?: (Patient-Rptd) No  TIMED UP AND GO:  Was the test performed?  No  Cognitive Function: Declined/Normal: No cognitive concerns noted by patient or family. Patient alert, oriented, able to answer questions appropriately and recall recent events. No signs of memory loss or confusion.        Immunizations Immunization History  Administered Date(s) Administered   Influenza Inj Mdck Quad Pf 07/01/2017   Influenza Whole 09/26/2007, 08/14/2008   PFIZER(Purple Top)SARS-COV-2 Vaccination  01/11/2020, 02/05/2020   PNEUMOCOCCAL CONJUGATE-20 02/14/2023   Td 04/27/2007   Zoster Recombinant(Shingrix) 03/09/2017, 07/05/2017    Screening Tests Health Maintenance  Topic Date Due   DTaP/Tdap/Td (2 - Tdap) 04/26/2017   Medicare Annual Wellness (AWV)  04/28/2023   COVID-19 Vaccine (3 - 2024-25 season) 06/19/2023   INFLUENZA VACCINE  05/18/2024   MAMMOGRAM  03/24/2025   Colonoscopy  06/22/2032   Pneumococcal Vaccine: 50+ Years  Completed   DEXA SCAN  Completed   Hepatitis C Screening  Completed   Zoster Vaccines- Shingrix  Completed   HPV VACCINES  Aged Out   Meningococcal B Vaccine  Aged Out    Health Maintenance  Health Maintenance Due  Topic Date Due   DTaP/Tdap/Td (2 - Tdap) 04/26/2017   Medicare Annual Wellness (AWV)  04/28/2023   COVID-19 Vaccine (3 - 2024-25 season) 06/19/2023   Health Maintenance Items Addressed: Mammogram ordered, See Nurse Notes at the end of this note  Additional Screening:  Vision Screening: Recommended annual ophthalmology exams for early detection of glaucoma and other disorders of the eye. Would you like a referral to an eye doctor? No    Dental Screening: Recommended annual dental exams for proper oral hygiene  Community Resource Referral / Chronic Care Management: CRR required this visit?  No   CCM required this visit?  No   Plan:    I have personally reviewed and noted the following in the patient's chart:   Medical and social history Use of alcohol , tobacco or illicit drugs  Current medications and supplements including opioid prescriptions. Patient is not currently taking opioid prescriptions. Functional ability and status Nutritional status Physical activity Advanced directives List of other physicians Hospitalizations, surgeries, and ER visits in previous 12 months Vitals Screenings to include cognitive, depression, and falls Referrals and appointments  In addition, I have reviewed and discussed with patient  certain preventive protocols, quality metrics, and best practice recommendations. A written personalized care plan for preventive services as well as general preventive health recommendations were provided to patient.   Kaitlynne Wenz L Kent Riendeau, CMA   04/04/2024   After Visit Summary: (MyChart) Due to this being a telephonic visit, the after visit summary with patients personalized plan was offered to patient via MyChart   Notes: Patient is due for a Tdap and a mammogram.  Order has been placed for mammogram today.  She had no other concerns to address today.

## 2024-04-04 NOTE — Patient Instructions (Signed)
 [name] , Thank you for taking time out of your busy schedule to complete your Annual Wellness Visit with me. I enjoyed our conversation and look forward to speaking with you again next year. I, as well as your care team,  appreciate your ongoing commitment to your health goals. Please review the following plan we discussed and let me know if I can assist you in the future. Your Game plan/ To Do List    Referrals: If you haven't heard from the office you've been referred to, please reach out to them at the phone provided.  You have an order for:  [x]   3D Mammogram     Please call for appointment:  [facility] [address] [place], [place] [address] [phone]  Make sure to wear two-piece clothing.  No lotions, powders, or deodorants the day of the appointment. Make sure to bring picture ID and insurance card.  Bring list of medications you are currently taking including any supplements.   Follow up Visits: Next Medicare AWV with our clinical staff: 04/05/2025.   Have you seen your provider in the last 6 months (3 months if uncontrolled diabetes)? Yes Next Office Visit with your provider: Patient stated that she will call the office to schedule her next office visit.  Her last office visit was on 02/15/2024.  Clinician Recommendations:  Aim for 30 minutes of exercise or brisk walking, 6-8 glasses of water, and 5 servings of fruits and vegetables each day. You are due for a tetanus vaccine and can get that done at your local pharmacy.        This is a list of the screening recommended for you and due dates:  Health Maintenance  Topic Date Due   DTaP/Tdap/Td vaccine (2 - Tdap) 04/26/2017   Medicare Annual Wellness Visit  04/28/2023   COVID-19 Vaccine (3 - 2024-25 season) 06/19/2023   Flu Shot  05/18/2024   Mammogram  03/24/2025   Colon Cancer Screening  06/22/2032   Pneumococcal Vaccine for age over 35  Completed   DEXA scan (bone density measurement)   Completed   Hepatitis C Screening  Completed   Zoster (Shingles) Vaccine  Completed   HPV Vaccine  Aged Out   Meningitis B Vaccine  Aged Out    Advanced directives: (Copy Requested) Please bring a copy of your health care power of attorney and living will to the office to be added to your chart at your convenience. You can mail to [facility] [address] [place], [place] [address] or email to [email or web address] Advance Care Planning is important because it:  [x]  Makes sure you receive the medical care that is consistent with your values, goals, and preferences  [x]  It provides guidance to your family and loved ones and reduces their decisional burden about whether or not they are making the right decisions based on your wishes.  Follow the link provided in your after visit summary or read over the paperwork we have mailed to you to help you started getting your Advance Directives in place. If you need assistance in completing these, please reach out to us  so that we can help you!  See attachments for Preventive Care and Fall Prevention Tips.

## 2024-04-18 ENCOUNTER — Other Ambulatory Visit: Payer: Self-pay | Admitting: Internal Medicine

## 2024-04-18 DIAGNOSIS — Z0000 Encounter for general adult medical examination without abnormal findings: Secondary | ICD-10-CM

## 2024-04-19 ENCOUNTER — Ambulatory Visit
Admission: RE | Admit: 2024-04-19 | Discharge: 2024-04-19 | Disposition: A | Discharge status: Home / Self Care | Source: Ambulatory Visit | Attending: Internal Medicine | Admitting: Internal Medicine

## 2024-04-19 DIAGNOSIS — Z1231 Encounter for screening mammogram for malignant neoplasm of breast: Secondary | ICD-10-CM | POA: Diagnosis not present

## 2024-04-19 DIAGNOSIS — Z0000 Encounter for general adult medical examination without abnormal findings: Secondary | ICD-10-CM

## 2024-04-24 ENCOUNTER — Encounter: Payer: Self-pay | Admitting: Obstetrics and Gynecology

## 2024-04-24 ENCOUNTER — Ambulatory Visit (INDEPENDENT_AMBULATORY_CARE_PROVIDER_SITE_OTHER): Payer: Medicare HMO | Admitting: Obstetrics and Gynecology

## 2024-04-24 VITALS — BP 110/72 | HR 83 | Ht 67.0 in | Wt 177.0 lb

## 2024-04-24 DIAGNOSIS — Z01419 Encounter for gynecological examination (general) (routine) without abnormal findings: Secondary | ICD-10-CM

## 2024-04-24 DIAGNOSIS — N9089 Other specified noninflammatory disorders of vulva and perineum: Secondary | ICD-10-CM

## 2024-04-24 DIAGNOSIS — A630 Anogenital (venereal) warts: Secondary | ICD-10-CM | POA: Diagnosis not present

## 2024-04-24 DIAGNOSIS — M810 Age-related osteoporosis without current pathological fracture: Secondary | ICD-10-CM

## 2024-04-24 DIAGNOSIS — Z01411 Encounter for gynecological examination (general) (routine) with abnormal findings: Secondary | ICD-10-CM | POA: Diagnosis not present

## 2024-04-24 DIAGNOSIS — Z9289 Personal history of other medical treatment: Secondary | ICD-10-CM

## 2024-04-24 MED ORDER — IMIQUIMOD 5 % EX CREA: TOPICAL_CREAM | CUTANEOUS | 3 refills | Status: AC

## 2024-04-24 NOTE — Progress Notes (Signed)
 68 y.o. G76P0001 Widowed Caucasian female here for a breast and pelvic exam. Thinks she is possibly having a breakout of vaginal warts.    Has used Aldara  in the past.   The patient is also followed for osteopenia.  Had a right hip replacement.  Now having tendinitis and seeing her surgeon.   Spending time with friends.   Not sexually active.   Not using the estrogen cream.   PCP: Rollene Almarie LABOR, MD   Patient's last menstrual period was 06/24/2012.           Sexually active: No.  The current method of family planning is post menopausal status.    Menopausal hormone therapy:  n/a Exercising: No.   Smoker:  Former   OB History     Gravida  1   Para  1   Term  0   Preterm  0   AB  0   Living  1      SAB  0   IAB  0   Ectopic  0   Multiple      Live Births              HEALTH MAINTENANCE: Last 2 paps: 05/23/23 neg HR HPV neg, 02/11/22 neg HR HPV neg, 03/23/17 neg, neg HR HPV.    History of abnormal Pap or positive HPV:  no Mammogram:  04/19/24 - results not back  Colonoscopy:  06/22/22 - told to do every 3 years.   Bone Density:  11/04/23  Result  osteoporosis of left forearm and osteopenia of right hip.  Hx fracture of arm at age 47 - 53 yo.    Immunization History  Administered Date(s) Administered   Influenza Inj Mdck Quad Pf 07/01/2017   Influenza Whole 09/26/2007, 08/14/2008   PFIZER(Purple Top)SARS-COV-2 Vaccination 01/11/2020, 02/05/2020   PNEUMOCOCCAL CONJUGATE-20 02/14/2023   Td 04/27/2007   Zoster Recombinant(Shingrix) 03/09/2017, 07/05/2017      reports that she quit smoking about 33 years ago. Her smoking use included cigarettes. She has a 7.5 pack-year smoking history. She has never used smokeless tobacco. She reports current alcohol  use of about 3.0 standard drinks of alcohol  per week. She reports that she does not use drugs.  Past Medical History:  Diagnosis Date   Allergy 1979   dust mites, mold,pollen   Anxiety     Arthritis    Cataract Will have surgery in May 2024   DDD (degenerative disc disease), lumbar    neck   Depression    GERD (gastroesophageal reflux disease)    Hepatitis    treated by  Dr. Mills ee physcians   History of bronchitis    History of hiatal hernia    History of kidney stones    Hyperlipidemia    Hypertension    Hypothyroidism    Osteoporosis 2025   forearm   Spinal cord stimulator status    placed 2006   Thyroid  disease    takes synthroid     Past Surgical History:  Procedure Laterality Date   Anterior cervical decompression and fusion with a reflex hyper plate  89/93/7993   C4-C5 and C5-C6 , Dr. CHRISTELLA. Roy   BREAST EXCISIONAL BIOPSY Left 1982   BREAST SURGERY  1983   left breast cyst removed   cervical fusion w/OASIS system infuse  05/13/2006   C4-C5 and C5-C6 and C6-7,  Dr. emerson Carwin   CHOLECYSTECTOMY  ?2002   CHOLECYSTECTOMY  2002   COLONOSCOPY  ESOPHAGEAL DILATION     Dr. Dyane   KNEE SURGERY  06/2017   torn meniscus   LAPAROSCOPIC ENDOMETRIOSIS FULGURATION  1980's   Dr. Christophe   LIVER BIOPSY  2002   LOWER BACK SURGERY  07/2015   LUMBAR DISC SURGERY  08/13/2003   Diskectomy/fusion L5-S1 rt., Dr. EMERSON Carwin   lumbar hemilaminectomy  01/1989   L5-S1 on right, Dr. Candance   NASAL SINUS SURGERY  10/30/2007   Left upper Sinus lift,  Dr. CANDIE Lloyd   SHOULDER ARTHROSCOPY WITH SUBACROMIAL DECOMPRESSION Right 12/06/2014   Procedure: SHOULDER ARTHROSCOPY WITH SUBACROMIAL DECOMPRESSION; EXCISION BONE SPUR;  Surgeon: Elsie Mussel, MD;  Location: [facility];  Service: Orthopedics;  Laterality: Right;   SHOULDER SURGERY Right 12/06/2014   cleaned up bone spurs   SPINAL CORD STIMULATOR INSERTION  12/29/2004   leads placed along spine from T8 to T10, Dr. CHRISTELLA. Roy   Spinal cord stimulator pulse generator  01/05/2005   placed in right upper buttocks, Dr. CHRISTELLA. Carwin   SPINE SURGERY  1992 to 2016   THYROID  LOBECTOMY Right 02/13/2016   THYROIDECTOMY Right  02/13/2016   Procedure: RIGHT THYROID  LOBECTOMY WITH FROZEN SECTION ;  Surgeon: Ida Loader, MD;  Location: [facility];  Service: ENT;  Laterality: Right;   TONSILLECTOMY     TOTAL HIP ARTHROPLASTY Right 01/18/2024   Procedure: ARTHROPLASTY, HIP, TOTAL, ANTERIOR APPROACH;  Surgeon: Melodi Lerner, MD;  Location: [facility];  Service: Orthopedics;  Laterality: Right;   TUBAL LIGATION  1983    Current Outpatient Medications  Medication Sig Dispense Refill   escitalopram  (LEXAPRO ) 10 MG tablet Take 10 mg by mouth daily.     lamoTRIgine  (LAMICTAL ) 150 MG tablet Take 150 mg by mouth daily.     levothyroxine  (SYNTHROID ) 100 MCG tablet Take 100 mcg by mouth daily before breakfast.     lisinopril -hydrochlorothiazide  (ZESTORETIC ) 20-12.5 MG tablet TAKE 1/2 TABLET EVERY MORNING 45 tablet 3   Naphazoline-Pheniramine (OPCON-A) 0.027-0.315 % SOLN Place 1 drop into both eyes as needed (eye allergies).     pantoprazole  (PROTONIX ) 40 MG tablet TAKE 1 TABLET EVERY DAY (NEED MD APPOINTMENT FOR REFILLS) 90 tablet 3   pravastatin  (PRAVACHOL ) 20 MG tablet TAKE 1 TABLET EVERY DAY 90 tablet 3   rizatriptan  (MAXALT ) 10 MG tablet Take 1 tablet earliest onset of migraine.  May repeat in 2 hours if needed.  Maximum 2 tablets in 24 hours. 10 tablet 5   traZODone  (DESYREL ) 150 MG tablet TAKE 1 TABLET AT BEDTIME 90 tablet 3   No current facility-administered medications for this visit.    ALLERGIES: Iodinated contrast media, Codeine, Imitrex  [sumatriptan ], and Morphine   Family History  Problem Relation Age of Onset   Aneurysm Mother        brain   Stroke Mother 39   Alcohol  abuse Mother    Early death Mother    Hypertension Father    Atrial fibrillation Father    Arthritis Father    COPD Father    Colon cancer Father    Liver cancer Father    Hypertension Sister    Anxiety disorder Sister    Depression Sister    Hearing loss Sister    Kidney disease Sister    Varicose Veins Sister    Hypertension Brother     Cancer Brother        adrenal glands   Early death Brother    Lung cancer Brother    Brain cancer Brother  Hypertension Brother    Alcohol  abuse Brother    Anxiety disorder Brother    Depression Brother    Drug abuse Brother    Hypertension Brother    Breast cancer Neg Hx    BRCA 1/2 Neg Hx     Review of Systems  All other systems reviewed and are negative.   PHYSICAL EXAM:  BP 110/72 (BP Location: Right Arm, Patient Position: Sitting)   Pulse 83   Ht 5' 7 (1.702 m)   Wt 177 lb (80.3 kg)   LMP 06/24/2012   SpO2 95%   BMI 27.72 kg/m     General appearance: alert, cooperative and appears stated age Head: normocephalic, without obvious abnormality, atraumatic Neck: no adenopathy, supple, symmetrical, trachea midline and thyroid  normal to inspection and palpation Lungs: clear to auscultation bilaterally Breasts: right nipple inversion (long standing), no masses or tenderness, No nipple retraction or dimpling, No nipple discharge or bleeding, No axillary adenopathy Heart: regular rate and rhythm Abdomen: soft, non-tender; no masses, no organomegaly Extremities: extremities normal, atraumatic, no cyanosis or edema Skin: skin color, texture, turgor normal. No rashes or lesions Lymph nodes: cervical, supraclavicular, and axillary nodes normal. Neurologic: grossly normal  Pelvic: External genitalia:  7 mm patch left perineum - raised.              No abnormal inguinal nodes palpated.              Urethra:  normal appearing urethra with no masses, tenderness or lesions              Bartholins and Skenes: normal                 Vagina: normal appearing vagina with normal color and discharge, no lesions.  Atrophy.                Cervix: no lesions              Pap taken: no Bimanual Exam:  Uterus:  normal size, contour, position, consistency, mobility, non-tender              Adnexa: no mass, fullness, tenderness              Rectal exam: yes.  Confirms.              Anus:   normal sphincter tone, no lesions  Chaperone was present for exam:  Zada PARAS, CMA  ASSESSMENT: Encounter for breast and pelvic exam.  Hx condyloma and VIN I on biopsy 06/21/23. Current vulvar lesion.  Consistent with condyloma.  Personal history of other medical treatment.  Osteoporosis of wrist.   Spinal cord stimulator.  Right nipple inversion.  Old change.  FH colon cancer in father.   PLAN: Mammogram screening discussed. Self breast awareness reviewed. Pap and HRV collected:  no.  Due in 2027.  Guidelines for Calcium, Vitamin D , regular exercise program including cardiovascular and weight bearing exercise. Medication refills:  Aldara .  Instructed in use.  Declines vaginal estrogen cream.   We reviewed her bone density and increased risk of fracture.  She is declining prescription medication for osteoporosis.  Fall risk reduction reviewed.  BMD in 2 years.  Follow up:  in 4 months for recheck of vulva and also in 1 year for breast and pelvic exam.   Additional counseling given.  yes. 35 min  total time was spent for this patient encounter, including preparation, face-to-face counseling with the patient, coordination of care, and documentation  of the encounter in addition to doing the breast and pelvic exam.

## 2024-04-24 NOTE — Patient Instructions (Addendum)
 Calcium in Foods Calcium is a mineral in the body. Of all the minerals in your body, you have the most calcium. Most of the body's calcium supply is stored in bones and teeth. Calcium helps many parts of the body work, including: Blood and blood vessels. Nerves. Hormones. Muscles. Bones and teeth. When your calcium stores are low, you may be at risk for low bone mass, bone loss, and broken bones. When you get enough calcium, it helps to support strong bones and teeth throughout your life. Calcium is especially important for: Children during growth spurts. Females during adolescence. Females who are pregnant or breastfeeding. Females after their menstrual cycle stops (postmenopausal). Females whose menstrual cycle has stopped because of an eating disorder or regular intense exercise. People who can't eat or digest dairy products. People who eat a vegan diet. Recommended daily amounts of calcium: Females (ages 87 to 55): 1,000 mg per day. Females (ages 35 and older): 1,200 mg per day. Males (ages 53 to 45): 1,000 mg per day. Males (ages 43 and older): 1,200 mg per day. Females (ages 88 to 28): 1,300 mg per day. Males (ages 41 to 56): 1,300 mg per day. General information Eat foods that are high in calcium. Try to get most of your calcium from food. Some people may benefit from taking calcium supplements. Check with your health care provider or an expert in healthy eating called a dietitian before starting any calcium supplements. Calcium supplements may interact with certain medicines. Too much calcium may cause other health problems, such as trouble pooping and kidney stones. For the body to absorb calcium, it needs vitamin D. Sources of vitamin D include: Skin exposure to direct sunlight. Foods, such as egg yolks, liver, mushrooms, saltwater fish, and fortified milk. Vitamin D supplements. Check with your provider or dietitian before starting any vitamin D supplements. The amount of  calcium that is absorbed in the body varies with type of food. Talk to a dietitian about what foods are best for you, especially if you are eat a vegan diet or don't eat dairy. What foods are high in calcium?  Foods that are high in calcium contain more than 100 milligrams per serving. Fruits Fortified orange juice or other fruit juice, 300 mg per 8 oz (237 mL) serving. Vegetables Collard greens, 260 mg per 1 cup (130 g) serving, cooked. Kale, 180 mg per 1 cup (118 g) serving, cooked. Bok choy, 180 mg per 1 cup (170 g) serving, cooked Grains Fortified frozen waffles, 200 mg in 2 waffles. Oatmeal, 180 mg in 1 cup (234 g) serving, cooked. Fortified white bread, 175 mg per slice. Meats and other proteins Sardines, canned with bones, 350 mg per 3.75 oz (92 g) serving. Salmon, canned with bones, 168 mg per 3 oz (85 g) serving. Canned shrimp, 125 mg per 3 oz (85 g) serving. Baked beans, 120 mg per 1 cup (266 g) serving. Tofu, firm, made with calcium sulfate, 861 mg per  cup (126 g) serving. Dairy Yogurt, plain, low-fat, 448 mg per 1 cup (245 g) serving Nonfat milk, 300 mg per 1 cup (245 g) serving. American cheese, 145 mg per 1 oz (21 g) serving or 1 slice. Cheddar cheese, 200 mg per 1 oz (28 g) serving or 1 slice. Cottage cheese 2%, 125 mg per  cup (113 g) serving. Fortified soy, rice, or almond milk, 300 mg per 1 cup (237 mL) serving. Mozzarella, part skim, 210 mg per 1 oz (21 g) serving. The items listed  above may not be a complete list of foods high in calcium. Actual amounts of calcium may be different depending on processing. Contact a dietitian for more information. What foods are lower in calcium? Foods that are lower in calcium contain 50 mg or less per serving. Fruits Apple, 1 medium, about 6 mg. Banana, 1 medium, about 12 mg. Vegetables Lettuce, 19 mg per 1 cup (35 g) serving. Tomato, 1 small, about 11 mg. Grains Rice, white, 8 mg per  cup (79 g) serving. Boiled  potatoes, 14 mg per 1 cup (160 g) serving. White bread, 6 mg per slice. Meats and other proteins Egg, 24 mg per 1 egg (50 g). Red meat, 7 mg per 4 oz (80 g) serving. Chicken, 17 mg per 4 oz (113 g) serving. Fish, cod, or trout, 20 mg per 4 oz (140 g) serving. Dairy Cream cheese, regular, 14 mg per 1 Tbsp (15 g) serving. Brie cheese, 50 mg per 1 oz (32 g) serving. The items listed above may not be a complete list of foods lower in calcium. Actual amounts of calcium may be different depending on processing. Contact a dietitian for more information. This information is not intended to replace advice given to you by your health care provider. Make sure you discuss any questions you have with your health care provider. Document Revised: 07/02/2023 Document Reviewed: 07/02/2023 Elsevier Patient Education  2024 [employer]  EXERCISE AND DIET:  We recommended that you start or continue a regular exercise program for good health. Regular exercise means any activity that makes your heart beat faster and makes you sweat.  We recommend exercising at least 30 minutes per day at least 3 days a week, preferably 4 or 5.  We also recommend a diet low in fat and sugar.  Inactivity, poor dietary choices and obesity can cause diabetes, heart attack, stroke, and kidney damage, among others.    ALCOHOL AND SMOKING:  Women should limit their alcohol intake to no more than 7 drinks/beers/glasses of wine (combined, not each!) per week. Moderation of alcohol intake to this level decreases your risk of breast cancer and liver damage. And of course, no recreational drugs are part of a healthy lifestyle.  And absolutely no smoking or even second hand smoke. Most people know smoking can cause heart and lung diseases, but did you know it also contributes to weakening of your bones? Aging of your skin?  Yellowing of your teeth and nails?  CALCIUM AND VITAMIN D:  Adequate intake of calcium and Vitamin D are recommended.  The  recommendations for exact amounts of these supplements seem to change often, but generally speaking 600 mg of calcium (either carbonate or citrate) and 800 units of Vitamin D per day seems prudent. Certain women may benefit from higher intake of Vitamin D.  If you are among these women, your doctor will have told you during your visit.    PAP SMEARS:  Pap smears, to check for cervical cancer or precancers,  have traditionally been done yearly, although recent scientific advances have shown that most women can have pap smears less often.  However, every woman still should have a physical exam from her gynecologist every year. It will include a breast check, inspection of the vulva and vagina to check for abnormal growths or skin changes, a visual exam of the cervix, and then an exam to evaluate the size and shape of the uterus and ovaries.  And after 68 years of age, a rectal exam  is indicated to check for rectal cancers. We will also provide age appropriate advice regarding health maintenance, like when you should have certain vaccines, screening for sexually transmitted diseases, bone density testing, colonoscopy, mammograms, etc.   MAMMOGRAMS:  All women over 11 years old should have a yearly mammogram. Many facilities now offer a "3D" mammogram, which may cost around $50 extra out of pocket. If possible,  we recommend you accept the option to have the 3D mammogram performed.  It both reduces the number of women who will be called back for extra views which then turn out to be normal, and it is better than the routine mammogram at detecting truly abnormal areas.    COLONOSCOPY:  Colonoscopy to screen for colon cancer is recommended for all women at age 8.  We know, you hate the idea of the prep.  We agree, BUT, having colon cancer and not knowing it is worse!!  Colon cancer so often starts as a polyp that can be seen and removed at colonscopy, which can quite literally save your life!  And if your first  colonoscopy is normal and you have no family history of colon cancer, most women don't have to have it again for 10 years.  Once every ten years, you can do something that may end up saving your life, right?  We will be happy to help you get it scheduled when you are ready.  Be sure to check your insurance coverage so you understand how much it will cost.  It may be covered as a preventative service at no cost, but you should check your particular policy.

## 2024-04-25 ENCOUNTER — Encounter: Payer: Self-pay | Admitting: Internal Medicine

## 2024-04-25 ENCOUNTER — Ambulatory Visit: Payer: Self-pay | Admitting: Internal Medicine

## 2024-04-25 LAB — HM MAMMOGRAPHY

## 2024-05-08 DIAGNOSIS — H5213 Myopia, bilateral: Secondary | ICD-10-CM | POA: Diagnosis not present

## 2024-05-14 DIAGNOSIS — Z01818 Encounter for other preprocedural examination: Secondary | ICD-10-CM | POA: Diagnosis not present

## 2024-05-14 DIAGNOSIS — H52223 Regular astigmatism, bilateral: Secondary | ICD-10-CM | POA: Diagnosis not present

## 2024-05-14 DIAGNOSIS — H2512 Age-related nuclear cataract, left eye: Secondary | ICD-10-CM | POA: Diagnosis not present

## 2024-05-14 DIAGNOSIS — H2513 Age-related nuclear cataract, bilateral: Secondary | ICD-10-CM | POA: Diagnosis not present

## 2024-05-17 ENCOUNTER — Encounter: Payer: Self-pay | Admitting: Obstetrics and Gynecology

## 2024-05-17 ENCOUNTER — Ambulatory Visit: Admitting: Obstetrics and Gynecology

## 2024-05-17 VITALS — BP 124/82 | HR 67

## 2024-05-17 DIAGNOSIS — N762 Acute vulvitis: Secondary | ICD-10-CM

## 2024-05-17 DIAGNOSIS — A630 Anogenital (venereal) warts: Secondary | ICD-10-CM | POA: Diagnosis not present

## 2024-05-17 DIAGNOSIS — N952 Postmenopausal atrophic vaginitis: Secondary | ICD-10-CM | POA: Diagnosis not present

## 2024-05-17 DIAGNOSIS — Z5181 Encounter for therapeutic drug level monitoring: Secondary | ICD-10-CM

## 2024-05-17 MED ORDER — ESTRADIOL 0.1 MG/GM VA CREA: TOPICAL_CREAM | VAGINAL | 1 refills | Status: DC

## 2024-05-17 MED ORDER — BETAMETHASONE VALERATE 0.1 % EX OINT: 1.0000 | TOPICAL_OINTMENT | Freq: Two times a day (BID) | CUTANEOUS | 0 refills | Status: DC

## 2024-05-17 NOTE — Progress Notes (Signed)
 GYNECOLOGY  VISIT   HPI: 68 y.o.   Widowed  Caucasian female   G1P0001 with Patient's last menstrual period was 06/24/2012.   here for: Genital warts breakout. States they are itching and would like to discuss other treatment options that aren't creams.       She states she is not getting benefit from the Aldara , which she started 2 weeks ago.   Having vaginal dryness.  Using vaginal estrogen cream.  Needs refill.  Having a lot of itching.   Has lidocaine  jelly 2% for vulvar discomfort.    Found out she is going to be a great grandmother.   GYNECOLOGIC HISTORY: Patient's last menstrual period was 06/24/2012. Contraception:  PMP Menopausal hormone therapy:  n/a Last 2 paps:  05/23/23 neg HR HPV neg, 02/11/22 neg HR HPV neg, 03/23/17 neg, neg HR HPV  History of abnormal Pap or positive HPV:  no Mammogram:  04/19/24 Breast Density Cat B, BIRADS Cat 1 neg         OB History     Gravida  1   Para  1   Term  0   Preterm  0   AB  0   Living  1      SAB  0   IAB  0   Ectopic  0   Multiple      Live Births                 Patient Active Problem List   Diagnosis Date Noted   OA (osteoarthritis) of hip 01/18/2024   Primary osteoarthritis of right hip 01/18/2024   Pre-operative cardiovascular examination 11/11/2023   Routine general medical examination at a health care facility 02/14/2023   Right hip pain 02/14/2023   Hyperlipidemia 08/27/2022   GERD (gastroesophageal reflux disease) 08/27/2022   Pain in right hand 05/07/2020   Lumbar facet joint syndrome 11/15/2019   Lumbar spondylosis 11/15/2019   Pain in left knee 06/07/2017   Snoring 09/27/2016   Other chronic pain 09/27/2016   CKD (chronic kidney disease), stage III (HCC) 09/27/2016   Failed back syndrome, lumbar 09/27/2016   Positive for microalbuminuria 09/27/2016   Essential hypertension 09/27/2016   Vaginal atrophy 02/18/2014   Endometriosis    Chronic hepatitis C (HCC) 09/15/2011    DEPRESSION/ANXIETY 01/24/2008   CONSTIPATION, CHRONIC 04/27/2007   INSOMNIA, PERSISTENT 04/06/2007   Postoperative hypothyroidism 02/07/2007   OSTEOPENIA 02/07/2007    Past Medical History:  Diagnosis Date   Allergy 1979   dust mites, mold,pollen   Anxiety    Arthritis    Cataract Will have surgery in May 2024   DDD (degenerative disc disease), lumbar    neck   Depression    GERD (gastroesophageal reflux disease)    Hepatitis    treated by  Dr. Mills ee physcians   History of bronchitis    History of hiatal hernia    History of kidney stones    Hyperlipidemia    Hypertension    Hypothyroidism    Osteoporosis 2025   forearm   Spinal cord stimulator status    placed 2006   Thyroid  disease    takes synthroid     Past Surgical History:  Procedure Laterality Date   Anterior cervical decompression and fusion with a reflex hyper plate  89/93/7993   C4-C5 and C5-C6 , Dr. CHRISTELLA. Roy   BREAST EXCISIONAL BIOPSY Left 1982   BREAST SURGERY  1983   left breast cyst removed  cervical fusion w/OASIS system infuse  05/13/2006   C4-C5 and C5-C6 and C6-7,  Dr. emerson Carwin   CHOLECYSTECTOMY  ?2002   CHOLECYSTECTOMY  2002   COLONOSCOPY     ESOPHAGEAL DILATION     Dr. Dyane   KNEE SURGERY  06/2017   torn meniscus   LAPAROSCOPIC ENDOMETRIOSIS FULGURATION  1980's   Dr. Christophe   LIVER BIOPSY  2002   LOWER BACK SURGERY  07/2015   LUMBAR DISC SURGERY  08/13/2003   Diskectomy/fusion L5-S1 rt., Dr. EMERSON Carwin   lumbar hemilaminectomy  01/1989   L5-S1 on right, Dr. Candance   NASAL SINUS SURGERY  10/30/2007   Left upper Sinus lift,  Dr. CANDIE Lloyd   SHOULDER ARTHROSCOPY WITH SUBACROMIAL DECOMPRESSION Right 12/06/2014   Procedure: SHOULDER ARTHROSCOPY WITH SUBACROMIAL DECOMPRESSION; EXCISION BONE SPUR;  Surgeon: Elsie Mussel, MD;  Location: [facility];  Service: Orthopedics;  Laterality: Right;   SHOULDER SURGERY Right 12/06/2014   cleaned up bone spurs   SPINAL CORD STIMULATOR  INSERTION  12/29/2004   leads placed along spine from T8 to T10, Dr. CHRISTELLA. Roy   Spinal cord stimulator pulse generator  01/05/2005   placed in right upper buttocks, Dr. CHRISTELLA. Carwin   SPINE SURGERY  1992 to 2016   THYROID  LOBECTOMY Right 02/13/2016   THYROIDECTOMY Right 02/13/2016   Procedure: RIGHT THYROID  LOBECTOMY WITH FROZEN SECTION ;  Surgeon: Ida Loader, MD;  Location: [facility] OR;  Service: ENT;  Laterality: Right;   TONSILLECTOMY     TOTAL HIP ARTHROPLASTY Right 01/18/2024   Procedure: ARTHROPLASTY, HIP, TOTAL, ANTERIOR APPROACH;  Surgeon: Melodi Lerner, MD;  Location: [facility];  Service: Orthopedics;  Laterality: Right;   TUBAL LIGATION  1983    Current Outpatient Medications  Medication Sig Dispense Refill   escitalopram  (LEXAPRO ) 10 MG tablet Take 10 mg by mouth daily.     estradiol  (ESTRACE ) 0.1 MG/GM vaginal cream Place vaginally.     fluticasone  (FLONASE ) 50 MCG/ACT nasal spray      imiquimod  (ALDARA ) 5 % cream Apply topically 3 (three) times a week. Apply entire packet of cream to affected areas 3 times weekly.  Apply at night and leave on during sleeping hours.  Wash off completely after 8 hours. 12 each 3   lamoTRIgine  (LAMICTAL ) 150 MG tablet Take 150 mg by mouth daily.     levothyroxine  (SYNTHROID ) 100 MCG tablet Take 100 mcg by mouth daily before breakfast.     lisinopril -hydrochlorothiazide  (ZESTORETIC ) 20-12.5 MG tablet TAKE 1/2 TABLET EVERY MORNING 45 tablet 3   Naphazoline-Pheniramine (OPCON-A) 0.027-0.315 % SOLN Place 1 drop into both eyes as needed (eye allergies).     pantoprazole  (PROTONIX ) 40 MG tablet TAKE 1 TABLET EVERY DAY (NEED MD APPOINTMENT FOR REFILLS) 90 tablet 3   pravastatin  (PRAVACHOL ) 20 MG tablet TAKE 1 TABLET EVERY DAY 90 tablet 3   rizatriptan  (MAXALT ) 10 MG tablet Take 1 tablet earliest onset of migraine.  May repeat in 2 hours if needed.  Maximum 2 tablets in 24 hours. 10 tablet 5   traZODone  (DESYREL ) 150 MG tablet TAKE 1 TABLET AT BEDTIME 90 tablet 3    No current facility-administered medications for this visit.     ALLERGIES: Iodinated contrast media, Codeine, Imitrex  [sumatriptan ], and Morphine   Family History  Problem Relation Age of Onset   Aneurysm Mother        brain   Stroke Mother 54   Alcohol  abuse Mother    Early death Mother  Hypertension Father    Atrial fibrillation Father    Arthritis Father    COPD Father    Colon cancer Father    Liver cancer Father    Hypertension Sister    Anxiety disorder Sister    Depression Sister    Hearing loss Sister    Kidney disease Sister    Varicose Veins Sister    Hypertension Brother    Cancer Brother        adrenal glands   Early death Brother    Lung cancer Brother    Brain cancer Brother    Hypertension Brother    Alcohol  abuse Brother    Anxiety disorder Brother    Depression Brother    Drug abuse Brother    Hypertension Brother    Breast cancer Neg Hx    BRCA 1/2 Neg Hx     Social History   Socioeconomic History   Marital status: Widowed    Spouse name: Not on file   Number of children: Not on file   Years of education: Not on file   Highest education level: 12th grade  Occupational History   Occupation: RETIRED  Tobacco Use   Smoking status: Former    Current packs/day: 0.00    Average packs/day: 0.5 packs/day for 15.0 years (7.5 ttl pk-yrs)    Types: Cigarettes    Quit date: 80    Years since quitting: 33.6   Smokeless tobacco: Never   Tobacco comments:    quit smoking in 1992  Vaping Use   Vaping status: Never Used  Substance and Sexual Activity   Alcohol  use: Yes    Alcohol /week: 3.0 standard drinks of alcohol     Types: 3 Standard drinks or equivalent per week    Comment: Maybe 3 drinks a month   Drug use: No   Sexual activity: Not Currently    Birth control/protection: Post-menopausal    Comment: Less than 5, after 16, hx genital warts, no abnormal pap, no DES  Other Topics Concern   Not on file  Social History Narrative    Walks the dog exercise.  Has to stop breath while walking the dog.    Drinks about 3 caffeine drinks a week    Currently disabled after 8 back surgeries with spinal cord/injury   Right handed   Lives alone; one story home/2025   Social Drivers of Health   Financial Resource Strain: Low Risk  (04/02/2024)   Overall Financial Resource Strain (CARDIA)    Difficulty of Paying Living Expenses: Not hard at all  Food Insecurity: No Food Insecurity (04/02/2024)   Hunger Vital Sign    Worried About Running Out of Food in the Last Year: Never true    Ran Out of Food in the Last Year: Never true  Transportation Needs: No Transportation Needs (04/02/2024)   PRAPARE - Administrator, Civil Service (Medical): No    Lack of Transportation (Non-Medical): No  Physical Activity: Inactive (04/02/2024)   Exercise Vital Sign    Days of Exercise per Week: 0 days    Minutes of Exercise per Session: Not on file  Stress: No Stress Concern Present (04/02/2024)   [employer] of Occupational Health - Occupational Stress Questionnaire    Feeling of Stress: Not at all  Social Connections: Socially Isolated (04/02/2024)   Social Connection and Isolation Panel    Frequency of Communication with Friends and Family: Once a week    Frequency of Social Gatherings with Friends  and Family: Once a week    Attends Religious Services: Never    Active Member of Clubs or Organizations: No    Attends Banker Meetings: Not on file    Marital Status: Widowed  Intimate Partner Violence: Not At Risk (01/18/2024)   Humiliation, Afraid, Rape, and Kick questionnaire    Fear of Current or Ex-Partner: No    Emotionally Abused: No    Physically Abused: No    Sexually Abused: No    Review of Systems  Genitourinary:  Positive for genital sores.  All other systems reviewed and are negative.   PHYSICAL EXAMINATION:   BP 124/82 (BP Location: Right Arm, Patient Position: Sitting)   Pulse 67   LMP  06/24/2012   SpO2 96%     General appearance: alert, cooperative and appears stated age   Pelvic: External genitalia:  erythema and flaking of skin of the left perineum and small area of left labia minora.  Perineal condyloma is much less apparent today.  No other condyloma noted.               Urethra:  normal appearing urethra with no masses, tenderness or lesions   Chaperone was present for exam:  Kari HERO, CMA  ASSESSMENT:  Vulvar condyloma.  Vulvitis likely from the Aldara .  Vaginal/vulvar atrophy.   PLAN:  Reduce use of Aldara  to twice weekly.  Apply twice weekly at bedtime and wash off in the am.  Valisone  ointment 0.1% to vulva bid x 2 weeks for a flare or vulvar irritation. Use twice weekly for maintenance dosing.  Vaginal estradiol  cream 1/2 gram to vulva and 1/2 gram to vagina at bedtime three nights per week.  Use OTC lidocaine  ointment 2% or 5% as rescue medication for vulvar irritation.  Fu prn.   35 min  total time was spent for this patient encounter, including preparation, face-to-face counseling with the patient, coordination of care, and documentation of the encounter.

## 2024-05-22 DIAGNOSIS — F410 Panic disorder [episodic paroxysmal anxiety] without agoraphobia: Secondary | ICD-10-CM | POA: Diagnosis not present

## 2024-05-22 DIAGNOSIS — F3341 Major depressive disorder, recurrent, in partial remission: Secondary | ICD-10-CM | POA: Diagnosis not present

## 2024-05-24 DIAGNOSIS — H268 Other specified cataract: Secondary | ICD-10-CM | POA: Diagnosis not present

## 2024-05-24 DIAGNOSIS — E039 Hypothyroidism, unspecified: Secondary | ICD-10-CM | POA: Diagnosis not present

## 2024-05-24 DIAGNOSIS — H25812 Combined forms of age-related cataract, left eye: Secondary | ICD-10-CM | POA: Diagnosis not present

## 2024-05-24 DIAGNOSIS — H2512 Age-related nuclear cataract, left eye: Secondary | ICD-10-CM | POA: Diagnosis not present

## 2024-05-25 HISTORY — PX: CATARACT EXTRACTION: SUR2

## 2024-06-13 ENCOUNTER — Other Ambulatory Visit

## 2024-06-13 ENCOUNTER — Telehealth: Payer: Self-pay | Admitting: Internal Medicine

## 2024-06-13 NOTE — Telephone Encounter (Signed)
 Called patient and left message 9:50am that appointment is needed with provider, that we are unable to draw her Thyroid  labs without appointment.

## 2024-06-13 NOTE — Telephone Encounter (Signed)
 I don't see any information about a reason or clinical symptoms. Typically for new clinical symptoms a visit is more appropriate so we can order appropriate workup for symptoms.

## 2024-06-13 NOTE — Telephone Encounter (Signed)
 Patient would like labs ordered to have her thyroid  checked. She spoke with [facility] and was told to come in, but no message was sent back. Patient would like a call back at [phone].

## 2024-06-25 ENCOUNTER — Ambulatory Visit (INDEPENDENT_AMBULATORY_CARE_PROVIDER_SITE_OTHER): Admitting: Family Medicine

## 2024-06-25 ENCOUNTER — Ambulatory Visit: Payer: Self-pay

## 2024-06-25 ENCOUNTER — Encounter: Payer: Self-pay | Admitting: Family Medicine

## 2024-06-25 VITALS — BP 136/80 | HR 59 | Temp 98.0°F | Resp 20 | Ht 67.0 in | Wt 172.0 lb

## 2024-06-25 DIAGNOSIS — N1831 Chronic kidney disease, stage 3a: Secondary | ICD-10-CM

## 2024-06-25 DIAGNOSIS — E559 Vitamin D deficiency, unspecified: Secondary | ICD-10-CM | POA: Diagnosis not present

## 2024-06-25 DIAGNOSIS — E890 Postprocedural hypothyroidism: Secondary | ICD-10-CM

## 2024-06-25 DIAGNOSIS — R5383 Other fatigue: Secondary | ICD-10-CM | POA: Diagnosis not present

## 2024-06-25 DIAGNOSIS — F341 Dysthymic disorder: Secondary | ICD-10-CM

## 2024-06-25 DIAGNOSIS — Z23 Encounter for immunization: Secondary | ICD-10-CM

## 2024-06-25 LAB — COMPREHENSIVE METABOLIC PANEL WITH GFR
ALT: 8 U/L (ref 0–35)
AST: 10 U/L (ref 0–37)
Albumin: 4.2 g/dL (ref 3.5–5.2)
Alkaline Phosphatase: 46 U/L (ref 39–117)
BUN: 15 mg/dL (ref 6–23)
CO2: 29 meq/L (ref 19–32)
Calcium: 9.5 mg/dL (ref 8.4–10.5)
Chloride: 103 meq/L (ref 96–112)
Creatinine, Ser: 1.25 mg/dL — ABNORMAL HIGH (ref 0.40–1.20)
GFR: 44.48 mL/min — ABNORMAL LOW (ref >60.00)
Glucose, Bld: 92 mg/dL (ref 70–99)
Potassium: 4 meq/L (ref 3.5–5.1)
Sodium: 139 meq/L (ref 135–145)
Total Bilirubin: 0.6 mg/dL (ref 0.2–1.2)
Total Protein: 6.8 g/dL (ref 6.0–8.3)

## 2024-06-25 LAB — TSH: TSH: 19.56 u[IU]/mL — ABNORMAL HIGH (ref 0.35–5.50)

## 2024-06-25 LAB — VITAMIN D 25 HYDROXY (VIT D DEFICIENCY, FRACTURES): VITD: 35.86 ng/mL (ref 30.00–100.00)

## 2024-06-25 MED ORDER — COVID-19 MRNA VAC-TRIS(PFIZER) 30 MCG/0.3ML IM SUSY: 0.3000 mL | PREFILLED_SYRINGE | Freq: Once | INTRAMUSCULAR | 0 refills | Status: AC

## 2024-06-25 NOTE — Assessment & Plan Note (Signed)
 Patient feels she is doing well while weaning off her medications. Orders:   CBC with Differential/Platelet; Future

## 2024-06-25 NOTE — Assessment & Plan Note (Signed)
 Reassessment. Orders:   Comprehensive metabolic panel with GFR; Future

## 2024-06-25 NOTE — Telephone Encounter (Signed)
 FYI Only or Action Required?: Action required by provider: request for appointment.  Patient was last seen in primary care on 02/15/2024 by Rollene Almarie LABOR, MD.  Called Nurse Triage reporting Fatigue.  Symptoms began about a month ago.  Interventions attempted: Prescription medications: Synthroid .  Symptoms are: gradually worsening.  Triage Disposition: See Physician Within 24 Hours  Patient/caregiver understands and will follow disposition?: YesCopied from CRM [id number]. Topic: Clinical - Red Word Triage >> Jun 25, 2024  9:21 AM [name] wrote: [name] that prompted transfer to Nurse Triage: Patient calling states she is having extreme and severe fatigue, and tired.  Requesting to see provider as soon as possible to be evaluated. Reason for Disposition  [1] MODERATE weakness (e.g., interferes with work, school, normal activities) AND [2] persists > 3 days  Answer Assessment - Initial Assessment Questions Last time I felt like this my thyroid  is acting up for roughly a month. Pt wants Covid and flu vaccine.      1. DESCRIPTION: Describe how you are feeling.     No energy  2. SEVERITY: How bad is it?  Can you stand and walk?     denies 3. ONSET: When did these symptoms begin? (e.g., hours, days, weeks, months)     Several weeks 4. CAUSE: What do you think is causing the weakness or fatigue? (e.g., not drinking enough fluids, medical problem, trouble sleeping)     Thyroid   5. NEW MEDICINES:  Have you started on any new medicines recently? (e.g., opioid pain medicines, benzodiazepines, muscle relaxants, antidepressants, antihistamines, neuroleptics, beta blockers)     denies 6. OTHER SYMPTOMS: Do you have any other symptoms? (e.g., chest pain, fever, cough, SOB, vomiting, diarrhea, bleeding, other areas of pain) Hair is falling out  Protocols used: Weakness (Generalized) and Fatigue-A-AH

## 2024-06-25 NOTE — Progress Notes (Signed)
 Assessment & Plan Fatigue, unspecified type Fatigue for one month. Possible causes: thyroid  dysfunction, anemia, vitamin D  deficiency. Orders:   TSH; Future   Comprehensive metabolic panel with GFR; Future   CBC with Differential/Platelet; Future   VITAMIN D  25 Hydroxy (Vit-D Deficiency, Fractures); Future  Postoperative hypothyroidism Patient has concerns that her thyroid  is uncontrolled due to fatigue and hair loss. Orders:   TSH; Future  Stage 3a chronic kidney disease (HCC) Reassessment. Orders:   Comprehensive metabolic panel with GFR; Future  Dysthymic disorder Patient feels she is doing well while weaning off her medications. Orders:   CBC with Differential/Platelet; Future  Vitamin D  insufficiency Patient did recently start a vitamin D  supplement. Orders:   VITAMIN D  25 Hydroxy (Vit-D Deficiency, Fractures); Future  Immunization due Patient to obtain at the pharmacy.  Orders:   COVID-19 mRNA vaccine, Pfizer, (COMIRNATY) syringe; Inject 0.3 mLs into the muscle once for 1 dose.   Follow up plan: Return if symptoms worsen or fail to improve.  Niki Rung, MSN, APRN, FNP-C  Subjective:  HPI: [name] is a 68 y.o. female presenting on 06/25/2024 for Fatigue (Noticed the change in the last month. No energy to get up and do things./), Hair/Scalp Problem (Loss of hair recently - she thinks her thyroid  could be off - last TSH was 09/2023 with Dr Bella [facility], pt prefers to have this done at PCP going forward. ), and Abnormal Lab (Follow up on kidney functions )  Discussed the use of AI scribe software for clinical note transcription with the patient, who gave verbal consent to proceed.  History of Present Illness   [name] is a 68 year old female with thyroid  issues who presents with fatigue.  She has been experiencing fatigue over the last month, significantly impacting her ability to perform daily activities. There have been no recent changes  in her medications except for the addition of vitamin D  to address her osteoporosis, which is localized to her arm. She has been tapering off trazodone , a medication she uses for sleep and depression, by taking half of her usual 150 mg dose every other night for the past two months.  She has a long-standing history of thyroid  issues dating back to the early 1990s. She notes increased hair loss and significantly dry skin, which she associates with her thyroid  condition and aging.  She mentions having some unusual kidney function results in the past, though she does not believe they were concerning.  She has undergone cataract surgery on her left eye on August 8th and is scheduled for surgery on her right eye next week.         ROS: Negative unless specifically indicated above in HPI.   Relevant past medical history reviewed and updated as indicated.   Allergies and medications reviewed and updated.   Current Outpatient Medications:    betamethasone  valerate ointment (VALISONE ) 0.1 %, Apply 1 Application topically 2 (two) times daily. Use sparingly on the vulva as needed twice a day for 2 weeks.  You may apply twice a week at bedtime for maintenance dosing., Disp: 45 g, Rfl: 0   COVID-19 mRNA vaccine, Pfizer, (COMIRNATY) syringe, Inject 0.3 mLs into the muscle once for 1 dose., Disp: 0.3 mL, Rfl: 0   estradiol  (ESTRACE ) 0.1 MG/GM vaginal cream, Place 1/2 gram per vagina and 1/2 gram to vulva at bedtime three times per week., Disp: 42.5 g, Rfl: 1   fluticasone  (FLONASE ) 50 MCG/ACT nasal spray, ,  Disp: , Rfl:    imiquimod  (ALDARA ) 5 % cream, Apply topically 3 (three) times a week. Apply entire packet of cream to affected areas 3 times weekly.  Apply at night and leave on during sleeping hours.  Wash off completely after 8 hours., Disp: 12 each, Rfl: 3   lamoTRIgine  (LAMICTAL ) 150 MG tablet, Take 150 mg by mouth daily., Disp: , Rfl:    levothyroxine  (SYNTHROID ) 100 MCG tablet, Take 100 mcg by  mouth daily before breakfast., Disp: , Rfl:    lisinopril -hydrochlorothiazide  (ZESTORETIC ) 20-12.5 MG tablet, TAKE 1/2 TABLET EVERY MORNING, Disp: 45 tablet, Rfl: 3   Naphazoline-Pheniramine (OPCON-A) 0.027-0.315 % SOLN, Place 1 drop into both eyes as needed (eye allergies)., Disp: , Rfl:    pantoprazole  (PROTONIX ) 40 MG tablet, TAKE 1 TABLET EVERY DAY (NEED MD APPOINTMENT FOR REFILLS), Disp: 90 tablet, Rfl: 3   pravastatin  (PRAVACHOL ) 20 MG tablet, TAKE 1 TABLET EVERY DAY, Disp: 90 tablet, Rfl: 3   rizatriptan  (MAXALT ) 10 MG tablet, Take 1 tablet earliest onset of migraine.  May repeat in 2 hours if needed.  Maximum 2 tablets in 24 hours., Disp: 10 tablet, Rfl: 5   traZODone  (DESYREL ) 150 MG tablet, TAKE 1 TABLET AT BEDTIME (Patient taking differently: Take 75 mg by mouth at bedtime.), Disp: 90 tablet, Rfl: 3  Allergies  Allergen Reactions   Morphine  Rash    Rash and abd pain    Iodinated Contrast Media Itching and Cough    Patient experienced itching of ears, coughing and scratchy throat after contrast injection.  Dr.Grady had patient observed for 10 minutes, patient discharged with symptoms resolved and no medicine administered.Dr Derrill instructed premeds prior to any future contrast study    Codeine Nausea Only   Imitrex  [Sumatriptan ] Hives    get hot and hyperventilate    Objective:   BP 136/80   Pulse (!) 59   Temp 98 F (36.7 C)   Resp 20   Ht 5' 7 (1.702 m)   Wt 172 lb (78 kg)   LMP 06/24/2012   SpO2 96%   BMI 26.94 kg/m    Physical Exam Vitals reviewed.  Constitutional:      General: She is not in acute distress.    Appearance: Normal appearance. She is not ill-appearing, toxic-appearing or diaphoretic.  HENT:     Head: Normocephalic and atraumatic.  Eyes:     General: No scleral icterus.       Right eye: No discharge.        Left eye: No discharge.     Conjunctiva/sclera: Conjunctivae normal.  Cardiovascular:     Rate and Rhythm: Normal rate and regular  rhythm.     Heart sounds: Normal heart sounds. No murmur heard.    No friction rub. No gallop.  Pulmonary:     Effort: Pulmonary effort is normal. No respiratory distress.     Breath sounds: Normal breath sounds. No stridor. No wheezing, rhonchi or rales.  Musculoskeletal:        General: Normal range of motion.     Cervical back: Normal range of motion.  Skin:    General: Skin is warm and dry.     Capillary Refill: Capillary refill takes less than 2 seconds.  Neurological:     General: No focal deficit present.     Mental Status: She is alert and oriented to person, place, and time. Mental status is at baseline.  Psychiatric:        Mood and Affect:  Mood normal.        Behavior: Behavior normal.        Thought Content: Thought content normal.        Judgment: Judgment normal.

## 2024-06-25 NOTE — Assessment & Plan Note (Signed)
 Patient has concerns that her thyroid  is uncontrolled due to fatigue and hair loss. Orders:   TSH; Future

## 2024-06-27 ENCOUNTER — Ambulatory Visit: Payer: Self-pay | Admitting: Family Medicine

## 2024-06-27 DIAGNOSIS — E890 Postprocedural hypothyroidism: Secondary | ICD-10-CM

## 2024-06-27 LAB — CBC WITH DIFFERENTIAL/PLATELET
Basophils Absolute: 0.1 K/uL (ref 0.0–0.1)
Basophils Relative: 0.7 % (ref 0.0–3.0)
Eosinophils Absolute: 0.1 K/uL (ref 0.0–0.7)
Eosinophils Relative: 1.7 % (ref 0.0–5.0)
HCT: 37.3 % (ref 36.0–46.0)
Hemoglobin: 12.3 g/dL (ref 12.0–15.0)
Lymphocytes Relative: 30.8 % (ref 12.0–46.0)
Lymphs Abs: 2.4 K/uL (ref 0.7–4.0)
MCHC: 33.1 g/dL (ref 30.0–36.0)
MCV: 79.1 fl (ref 78.0–100.0)
Monocytes Absolute: 0.7 K/uL (ref 0.1–1.0)
Monocytes Relative: 9.1 % (ref 3.0–12.0)
Neutro Abs: 4.5 K/uL (ref 1.4–7.7)
Neutrophils Relative %: 57.7 % (ref 43.0–77.0)
Platelets: 243 K/uL (ref 150.0–400.0)
RBC: 4.72 Mil/uL (ref 3.87–5.11)
RDW: 16.5 % — ABNORMAL HIGH (ref 11.5–15.5)
WBC: 7.7 K/uL (ref 4.0–10.5)

## 2024-06-29 ENCOUNTER — Ambulatory Visit: Payer: Self-pay

## 2024-06-29 ENCOUNTER — Telehealth: Payer: Self-pay

## 2024-06-29 NOTE — Telephone Encounter (Signed)
 FYI Only or Action Required?: FYI only for provider.  Patient was last seen in primary care on 06/25/2024 by Carol Niki FALCON, FNP.  Called Nurse Triage reporting Flank Pain.  Symptoms began several days ago.  Interventions attempted: Nothing.  Symptoms are: stable.  Triage Disposition: See PCP When Office is Open (Within 3 Days)  Patient/caregiver understands and will follow disposition?: Yes  Copied from CRM #[id number]. Topic: Clinical - Red Word Triage >> Jun 29, 2024  4:07 PM Lauren C wrote: Red Word that prompted transfer to Nurse Triage: Pain in left kidney. Wants to do UA. Reason for Disposition  History of kidney stones  Answer Assessment - Initial Assessment Questions 1. LOCATION: Where does it hurt? (e.g., left, right)     Left flank area  2. ONSET: When did the pain start?     Started on 2 days now  3. SEVERITY: How bad is the pain? (e.g., Scale 1-10; mild, moderate, or severe)     4/10  4. PATTERN: Does the pain come and go, or is it constant?      Pain comes and goes   5. CAUSE: What do you think is causing the pain?     Think she she may have a UTI or kidney infection  6. OTHER SYMPTOMS:  Do you have any other symptoms? (e.g., fever, abdomen pain, vomiting, leg weakness, burning with urination, blood in urine)     No  7. PREGNANCY:  Is there any chance you are pregnant? When was your last menstrual period?     No  Protocols used: Flank Pain-A-AH

## 2024-06-29 NOTE — Telephone Encounter (Signed)
 Copied from CRM #[id number]. Topic: Medical Record Request - Provider/Facility Request >> Jun 29, 2024  4:02 PM Tinnie BROCKS wrote: Reason for CRM: Pt requesting thyroid  labs be sent to Littie Caffey, MD with [facility], Fax: [phone], for continuation of care.

## 2024-07-02 ENCOUNTER — Encounter: Payer: Self-pay | Admitting: Family Medicine

## 2024-07-02 ENCOUNTER — Ambulatory Visit (INDEPENDENT_AMBULATORY_CARE_PROVIDER_SITE_OTHER): Admitting: Family Medicine

## 2024-07-02 VITALS — BP 124/78 | HR 58 | Temp 97.9°F | Resp 18 | Ht 67.0 in | Wt 176.0 lb

## 2024-07-02 DIAGNOSIS — E890 Postprocedural hypothyroidism: Secondary | ICD-10-CM | POA: Diagnosis not present

## 2024-07-02 DIAGNOSIS — R3989 Other symptoms and signs involving the genitourinary system: Secondary | ICD-10-CM | POA: Diagnosis not present

## 2024-07-02 LAB — POCT URINALYSIS DIPSTICK
Bilirubin, UA: NEGATIVE
Blood, UA: NEGATIVE
Glucose, UA: NEGATIVE
Ketones, UA: NEGATIVE
Nitrite, UA: NEGATIVE
Protein, UA: NEGATIVE
Spec Grav, UA: 1.015 (ref 1.010–1.025)
Urobilinogen, UA: NEGATIVE U/dL — AB
pH, UA: 5 (ref 5.0–8.0)

## 2024-07-02 NOTE — Assessment & Plan Note (Signed)
 Managed with levothyroxine . Current regimen adjusted to achieve a weekly dose of 800 mcg. Previous TSH level was 19.5, indicating under-treatment. - Continue levothyroxine  100 mcg daily and 200 mcg once weekly. - Check TSH in 4-6 weeks.

## 2024-07-02 NOTE — Progress Notes (Signed)
 Assessment & Plan Suspected UTI Long-term urinary odor and irritation with recent flank pain. Urinalysis showed trace leukocytes, not definitive for UTI. Differential includes postmenopausal atrophy mimicking UTI symptoms. - Await urine culture results before initiating antibiotics. Orders:   POCT urinalysis dipstick   Urine Culture; Future  Postoperative hypothyroidism Managed with levothyroxine . Current regimen adjusted to achieve a weekly dose of 800 mcg. Previous TSH level was 19.5, indicating under-treatment. - Continue levothyroxine  100 mcg daily and 200 mcg once weekly. - Check TSH in 4-6 weeks.       Follow up plan: Return if symptoms worsen or fail to improve.  Niki Rung, MSN, APRN, FNP-C  Subjective:  HPI: [name] is a 68 y.o. female presenting on 07/02/2024 for Flank Pain (Flank pain is on the left side, Some burning with urination, no frequency /Started Tuesday PM 9/9)  Discussed the use of AI scribe software for clinical note transcription with the patient, who gave verbal consent to proceed.  History of Present Illness   [name] is a 68 year old female who presents with urinary symptoms and vaginal dryness.  She has been experiencing urinary symptoms for about a year, including flank pain, a burning sensation, and a persistent urinary odor described as 'like a nursing home'. There is no urinary leakage, but she experiences skin irritation when urinating and uses wet wipes and skin-sensitive products to manage this irritation.  She has been using Estrace  cream for about a year, applying it three times a week to address significant vaginal dryness and irritation. No yeast infection is present, but she describes being 'horribly dry'.  She is currently on levothyroxine , taking 100 mcg daily with an increased dose of 200 mcg once a week. She reports feeling fatigued, particularly in the afternoon, and has been on varying doses in the past, including 112  mcg and 125 mcg, which were either insufficient or excessive. Her recent TSH level was 19.5.       ROS: Negative unless specifically indicated above in HPI.   Relevant past medical history reviewed and updated as indicated.   Allergies and medications reviewed and updated.   Current Outpatient Medications:    betamethasone  valerate ointment (VALISONE ) 0.1 %, Apply 1 Application topically 2 (two) times daily. Use sparingly on the vulva as needed twice a day for 2 weeks.  You may apply twice a week at bedtime for maintenance dosing., Disp: 45 g, Rfl: 0   escitalopram  (LEXAPRO ) 10 MG tablet, Take 10 mg by mouth daily., Disp: , Rfl:    estradiol  (ESTRACE ) 0.1 MG/GM vaginal cream, Place 1/2 gram per vagina and 1/2 gram to vulva at bedtime three times per week., Disp: 42.5 g, Rfl: 1   fluticasone  (FLONASE ) 50 MCG/ACT nasal spray, , Disp: , Rfl:    imiquimod  (ALDARA ) 5 % cream, Apply topically 3 (three) times a week. Apply entire packet of cream to affected areas 3 times weekly.  Apply at night and leave on during sleeping hours.  Wash off completely after 8 hours., Disp: 12 each, Rfl: 3   levothyroxine  (SYNTHROID ) 100 MCG tablet, Take 100 mcg by mouth daily before breakfast., Disp: , Rfl:    lisinopril -hydrochlorothiazide  (ZESTORETIC ) 20-12.5 MG tablet, TAKE 1/2 TABLET EVERY MORNING, Disp: 45 tablet, Rfl: 3   pravastatin  (PRAVACHOL ) 20 MG tablet, TAKE 1 TABLET EVERY DAY, Disp: 90 tablet, Rfl: 3   rizatriptan  (MAXALT ) 10 MG tablet, Take 1 tablet earliest onset of migraine.  May repeat in 2 hours if  needed.  Maximum 2 tablets in 24 hours., Disp: 10 tablet, Rfl: 5   traZODone  (DESYREL ) 150 MG tablet, TAKE 1 TABLET AT BEDTIME (Patient taking differently: Take 75 mg by mouth at bedtime.), Disp: 90 tablet, Rfl: 3   lamoTRIgine  (LAMICTAL ) 150 MG tablet, Take 150 mg by mouth daily., Disp: , Rfl:    Naphazoline-Pheniramine (OPCON-A) 0.027-0.315 % SOLN, Place 1 drop into both eyes as needed (eye allergies).,  Disp: , Rfl:    pantoprazole  (PROTONIX ) 40 MG tablet, TAKE 1 TABLET EVERY DAY (NEED MD APPOINTMENT FOR REFILLS), Disp: 90 tablet, Rfl: 3  Allergies  Allergen Reactions   Morphine  Rash    Rash and abd pain    Iodinated Contrast Media Itching and Cough    Patient experienced itching of ears, coughing and scratchy throat after contrast injection.  Dr.Grady had patient observed for 10 minutes, patient discharged with symptoms resolved and no medicine administered.Dr Derrill instructed premeds prior to any future contrast study    Codeine Nausea Only   Imitrex  [Sumatriptan ] Hives    get hot and hyperventilate    Objective:   BP 124/78   Pulse (!) 58   Temp 97.9 F (36.6 C)   Resp 18   Ht 5' 7 (1.702 m)   Wt 176 lb (79.8 kg)   LMP 06/24/2012   SpO2 94%   BMI 27.57 kg/m    Physical Exam Vitals reviewed.  Constitutional:      General: She is not in acute distress.    Appearance: Normal appearance. She is not ill-appearing, toxic-appearing or diaphoretic.  HENT:     Head: Normocephalic and atraumatic.  Eyes:     General: No scleral icterus.       Right eye: No discharge.        Left eye: No discharge.     Conjunctiva/sclera: Conjunctivae normal.  Cardiovascular:     Rate and Rhythm: Normal rate.  Pulmonary:     Effort: Pulmonary effort is normal. No respiratory distress.  Abdominal:     Tenderness: There is left CVA tenderness. There is no right CVA tenderness.  Musculoskeletal:        General: Normal range of motion.     Cervical back: Normal range of motion.  Skin:    General: Skin is warm and dry.     Capillary Refill: Capillary refill takes less than 2 seconds.  Neurological:     General: No focal deficit present.     Mental Status: She is alert and oriented to person, place, and time. Mental status is at baseline.  Psychiatric:        Mood and Affect: Mood normal.        Behavior: Behavior normal.        Thought Content: Thought content normal.        Judgment:  Judgment normal.

## 2024-07-02 NOTE — Telephone Encounter (Signed)
 Pt was seen today and thyroid  was discussed

## 2024-07-03 LAB — URINE CULTURE: Result:: NO GROWTH

## 2024-07-04 ENCOUNTER — Ambulatory Visit: Payer: Self-pay | Admitting: Family Medicine

## 2024-07-05 DIAGNOSIS — E039 Hypothyroidism, unspecified: Secondary | ICD-10-CM | POA: Diagnosis not present

## 2024-07-05 DIAGNOSIS — H2511 Age-related nuclear cataract, right eye: Secondary | ICD-10-CM | POA: Diagnosis not present

## 2024-07-05 DIAGNOSIS — H25811 Combined forms of age-related cataract, right eye: Secondary | ICD-10-CM | POA: Diagnosis not present

## 2024-07-10 DIAGNOSIS — H2511 Age-related nuclear cataract, right eye: Secondary | ICD-10-CM | POA: Diagnosis not present

## 2024-07-20 NOTE — Telephone Encounter (Signed)
 error

## 2024-07-20 NOTE — Telephone Encounter (Signed)
 Error

## 2024-08-02 DIAGNOSIS — Z96641 Presence of right artificial hip joint: Secondary | ICD-10-CM | POA: Diagnosis not present

## 2024-08-14 ENCOUNTER — Other Ambulatory Visit

## 2024-08-14 DIAGNOSIS — H26491 Other secondary cataract, right eye: Secondary | ICD-10-CM | POA: Diagnosis not present

## 2024-08-14 DIAGNOSIS — H538 Other visual disturbances: Secondary | ICD-10-CM | POA: Diagnosis not present

## 2024-08-14 DIAGNOSIS — H43823 Vitreomacular adhesion, bilateral: Secondary | ICD-10-CM | POA: Diagnosis not present

## 2024-08-14 DIAGNOSIS — Z961 Presence of intraocular lens: Secondary | ICD-10-CM | POA: Diagnosis not present

## 2024-08-16 ENCOUNTER — Other Ambulatory Visit

## 2024-08-16 DIAGNOSIS — E890 Postprocedural hypothyroidism: Secondary | ICD-10-CM

## 2024-08-16 LAB — TSH: TSH: 15.31 u[IU]/mL — ABNORMAL HIGH (ref 0.35–5.50)

## 2024-08-17 ENCOUNTER — Telehealth: Payer: Self-pay

## 2024-08-17 NOTE — Telephone Encounter (Signed)
 Copied from CRM #[id number]. Topic: Clinical - Lab/Test Results >> Aug 17, 2024  9:17 AM Winona R wrote: Pt requesting that her TSH results be sent to her Endo Dr. Tommas @ [facility] before her appointment on Wednesday - Fax [phone]

## 2024-08-17 NOTE — Telephone Encounter (Signed)
 Labs faxed to [facility] Dr. Tommas. Patient is aware.

## 2024-08-20 ENCOUNTER — Ambulatory Visit: Payer: Self-pay | Admitting: Family Medicine

## 2024-08-22 DIAGNOSIS — E039 Hypothyroidism, unspecified: Secondary | ICD-10-CM | POA: Diagnosis not present

## 2024-08-22 DIAGNOSIS — R7301 Impaired fasting glucose: Secondary | ICD-10-CM | POA: Diagnosis not present

## 2024-08-22 DIAGNOSIS — E049 Nontoxic goiter, unspecified: Secondary | ICD-10-CM | POA: Diagnosis not present

## 2024-08-22 DIAGNOSIS — I10 Essential (primary) hypertension: Secondary | ICD-10-CM | POA: Diagnosis not present

## 2024-08-22 DIAGNOSIS — R197 Diarrhea, unspecified: Secondary | ICD-10-CM | POA: Diagnosis not present

## 2024-08-22 DIAGNOSIS — M810 Age-related osteoporosis without current pathological fracture: Secondary | ICD-10-CM | POA: Diagnosis not present

## 2024-08-23 ENCOUNTER — Ambulatory Visit: Payer: Self-pay

## 2024-08-23 NOTE — Telephone Encounter (Signed)
 FYI Only or Action Required?: FYI only for provider: appointment scheduled on 08/31/2024 at 9:20 AM and patient would like to be seen sooner by PCP if possible-placed patient on wait list.  Patient was last seen in primary care on 07/02/2024 by Merlynn Niki FALCON, FNP.  Called Nurse Triage reporting Diarrhea.  Symptoms began increased over the last several days.  Interventions attempted: Rest, hydration, or home remedies.  Symptoms are: unchanged.  Triage Disposition: See PCP Within 2 Weeks  Reason for Triage: Patient is calling because she believes something may be off with her thyroid , she states whenever she eats something she immediately goes into the rest room with diarrhea and stomach pain and cramps.    Reason for Disposition  Diarrhea is a chronic symptom (recurrent or ongoing AND present > 4 weeks)  Answer Assessment - Initial Assessment Questions Patient states that when she eats, she has diarrhea shortly after eating pretty much every time. Patient was told by her endocrinologist that this could be related to her thyroid . Patient states she would like to see her PCP only. Scheduled to see PCP for next available on 08/31/2024 at 9:20 AM.  1. DIARRHEA SEVERITY: How bad is the diarrhea? How many more stools have you had in the past 24 hours than normal?      Mild 2. ONSET: When did the diarrhea begin?      Has been going on for awhile.  3. STOOL DESCRIPTION:  How loose or watery is the diarrhea? What is the stool color? Is there any blood or mucous in the stool?     Loose and watery 4. VOMITING: Are you also vomiting? If Yes, ask: How many times in the past 24 hours?      no 5. ABDOMEN PAIN: Are you having any abdomen pain? If Yes, ask: What does it feel like? (e.g., crampy, dull, intermittent, constant)      Yes-crampy 6. ABDOMEN PAIN SEVERITY: If present, ask: How bad is the pain?  (e.g., Scale 1-10; mild, moderate, or severe)     3 out of 10 7. ORAL  INTAKE: If vomiting, Have you been able to drink liquids? How much liquids have you had in the past 24 hours?     Able to drink liquids 8. HYDRATION: Any signs of dehydration? (e.g., dry mouth [not just dry lips], too weak to stand, dizziness, new weight loss) When did you last urinate?     no 9. EXPOSURE: Have you traveled to a foreign country recently? Have you been exposed to anyone with diarrhea? Could you have eaten any food that was spoiled?     no 10. ANTIBIOTIC USE: Are you taking antibiotics now or have you taken antibiotics in the past 2 months?       no 11. OTHER SYMPTOMS: Do you have any other symptoms? (e.g., fever, blood in stool)       nausea  Protocols used: Diarrhea-A-AH

## 2024-08-24 NOTE — Telephone Encounter (Signed)
 LVM patient may be able to be seen on Monday 11/10@ 11am same day visit). Advised pt to call office if she would like to schedule. If New or worsening symptoms develop go to ed or UC.

## 2024-08-27 ENCOUNTER — Encounter: Payer: Self-pay | Admitting: Emergency Medicine

## 2024-08-27 ENCOUNTER — Encounter: Payer: Self-pay | Admitting: Obstetrics and Gynecology

## 2024-08-27 ENCOUNTER — Ambulatory Visit (INDEPENDENT_AMBULATORY_CARE_PROVIDER_SITE_OTHER): Admitting: Emergency Medicine

## 2024-08-27 ENCOUNTER — Ambulatory Visit (INDEPENDENT_AMBULATORY_CARE_PROVIDER_SITE_OTHER): Admitting: Obstetrics and Gynecology

## 2024-08-27 VITALS — BP 124/84 | HR 87

## 2024-08-27 VITALS — BP 122/80 | HR 79 | Temp 98.5°F | Ht 67.0 in | Wt 172.0 lb

## 2024-08-27 DIAGNOSIS — Z5181 Encounter for therapeutic drug level monitoring: Secondary | ICD-10-CM

## 2024-08-27 DIAGNOSIS — R1032 Left lower quadrant pain: Secondary | ICD-10-CM | POA: Diagnosis not present

## 2024-08-27 DIAGNOSIS — N952 Postmenopausal atrophic vaginitis: Secondary | ICD-10-CM

## 2024-08-27 DIAGNOSIS — K591 Functional diarrhea: Secondary | ICD-10-CM | POA: Insufficient documentation

## 2024-08-27 MED ORDER — ESTRADIOL 0.01 % VA CREA: TOPICAL_CREAM | VAGINAL | 2 refills | Status: AC

## 2024-08-27 NOTE — Progress Notes (Signed)
 [place] JIN[place] Pride 68 y.o.   Chief Complaint  Patient presents with   Diarrhea    Pt states that it comes and goes and has been happening more frequent within the last few months     HISTORY OF PRESENT ILLNESS: Acute problem visit today. This is a 68 y.o. female complaining of watery nonbloody diarrhea on and off for the past several weeks Has history of hypothyroidism and medication has been adjusted due to increased TSH levels Patient takes pantoprazole  on a daily basis. Was able to follow-up with endocrinologist who raised possibility of celiac disease Scheduled for blood work in a couple weeks No other associated symptoms No other complaints or medical concerns today.  Diarrhea  Pertinent negatives include no abdominal pain, chills, coughing, fever, headaches or vomiting.     Prior to Admission medications   Medication Sig Start Date End Date Taking? Authorizing Provider  betamethasone  valerate ointment (VALISONE ) 0.1 % Apply 1 Application topically 2 (two) times daily. Use sparingly on the vulva as needed twice a day for 2 weeks.  You may apply twice a week at bedtime for maintenance dosing. 05/17/24   Amundson C Silva, Brook E, MD  estradiol  (ESTRACE ) 0.01 % CREA vaginal cream Use 1/2 g vaginally three times per week as needed to maintain symptom relief. 08/27/24   Cathlyn JAYSON Nikki Bobie FORBES, MD  fluticasone  (FLONASE ) 50 MCG/ACT nasal spray     [provider]  imiquimod  (ALDARA ) 5 % cream Apply topically 3 (three) times a week. Apply entire packet of cream to affected areas 3 times weekly.  Apply at night and leave on during sleeping hours.  Wash off completely after 8 hours. 04/25/24   Amundson C Silva, Brook E, MD  levothyroxine  (SYNTHROID ) 112 MCG tablet Take 112 mcg by mouth every morning. 08/22/24   [provider]  lisinopril -hydrochlorothiazide  (ZESTORETIC ) 20-12.5 MG tablet TAKE 1/2 TABLET EVERY MORNING 01/23/24   Rollene Almarie LABOR, MD   Naphazoline-Pheniramine (OPCON-A) 0.027-0.315 % SOLN Place 1 drop into both eyes as needed (eye allergies).    [provider]  pantoprazole  (PROTONIX ) 40 MG tablet TAKE 1 TABLET EVERY DAY (NEED MD APPOINTMENT FOR REFILLS) 02/29/24   Rollene Almarie LABOR, MD  pravastatin  (PRAVACHOL ) 20 MG tablet TAKE 1 TABLET EVERY DAY 12/21/23   Rollene Almarie LABOR, MD  rizatriptan  (MAXALT ) 10 MG tablet Take 1 tablet earliest onset of migraine.  May repeat in 2 hours if needed.  Maximum 2 tablets in 24 hours. 08/22/23   Skeet Juliene SAUNDERS, DO  traZODone  (DESYREL ) 150 MG tablet TAKE 1 TABLET AT BEDTIME Patient taking differently: Take 75 mg by mouth at bedtime. 08/01/23   Rollene Almarie LABOR, MD    Allergies  Allergen Reactions   Morphine  Rash    Rash and abd pain    Iodinated Contrast Media Itching and Cough    Patient experienced itching of ears, coughing and scratchy throat after contrast injection.  Dr.Grady had patient observed for 10 minutes, patient discharged with symptoms resolved and no medicine administered.Dr Derrill instructed premeds prior to any future contrast study    Codeine Nausea Only   Imitrex  [Sumatriptan ] Hives    get hot and hyperventilate    Patient Active Problem List   Diagnosis Date Noted   OA (osteoarthritis) of hip 01/18/2024   Primary osteoarthritis of right hip 01/18/2024   Pre-operative cardiovascular examination 11/11/2023   Routine general medical examination at a health care facility 02/14/2023   Right hip pain 02/14/2023  Hyperlipidemia 08/27/2022   GERD (gastroesophageal reflux disease) 08/27/2022   Pain in right hand 05/07/2020   Lumbar facet joint syndrome 11/15/2019   Lumbar spondylosis 11/15/2019   Pain in left knee 06/07/2017   Snoring 09/27/2016   Other chronic pain 09/27/2016   CKD (chronic kidney disease), stage III (HCC) 09/27/2016   Failed back syndrome, lumbar 09/27/2016   Positive for microalbuminuria 09/27/2016   Essential hypertension  09/27/2016   Vaginal atrophy 02/18/2014   Endometriosis    Chronic hepatitis C (HCC) 09/15/2011   DEPRESSION/ANXIETY 01/24/2008   CONSTIPATION, CHRONIC 04/27/2007   INSOMNIA, PERSISTENT 04/06/2007   Postoperative hypothyroidism 02/07/2007   OSTEOPENIA 02/07/2007    Past Medical History:  Diagnosis Date   Allergy 1979   dust mites, mold,pollen   Anxiety    Arthritis    Cataract Will have surgery in May 2024   DDD (degenerative disc disease), lumbar    neck   Depression    GERD (gastroesophageal reflux disease)    Hepatitis    treated by  Dr. Mills ee physcians   History of bronchitis    History of hiatal hernia    History of kidney stones    Hyperlipidemia    Hypertension    Hypothyroidism    Osteoporosis 2025   forearm   Spinal cord stimulator status    placed 2006   Thyroid  disease    takes synthroid     Past Surgical History:  Procedure Laterality Date   Anterior cervical decompression and fusion with a reflex hyper plate  89/93/7993   C4-C5 and C5-C6 , Dr. CHRISTELLA. Roy   BREAST EXCISIONAL BIOPSY Left 1982   BREAST SURGERY  1983   left breast cyst removed   CATARACT EXTRACTION Left 05/25/2024   cervical fusion w/OASIS system infuse  05/13/2006   C4-C5 and C5-C6 and C6-7,  Dr. emerson Carwin   CHOLECYSTECTOMY  ?2002   CHOLECYSTECTOMY  2002   COLONOSCOPY     ESOPHAGEAL DILATION     Dr. Dyane   KNEE SURGERY  06/2017   torn meniscus   LAPAROSCOPIC ENDOMETRIOSIS FULGURATION  1980's   Dr. Christophe   LIVER BIOPSY  2002   LOWER BACK SURGERY  07/2015   LUMBAR DISC SURGERY  08/13/2003   Diskectomy/fusion L5-S1 rt., Dr. EMERSON Carwin   lumbar hemilaminectomy  01/1989   L5-S1 on right, Dr. Candance   NASAL SINUS SURGERY  10/30/2007   Left upper Sinus lift,  Dr. CANDIE Lloyd   SHOULDER ARTHROSCOPY WITH SUBACROMIAL DECOMPRESSION Right 12/06/2014   Procedure: SHOULDER ARTHROSCOPY WITH SUBACROMIAL DECOMPRESSION; EXCISION BONE SPUR;  Surgeon: Elsie Mussel, MD;  Location: [facility];  Service: Orthopedics;  Laterality: Right;   SHOULDER SURGERY Right 12/06/2014   cleaned up bone spurs   SPINAL CORD STIMULATOR INSERTION  12/29/2004   leads placed along spine from T8 to T10, Dr. CHRISTELLA. Roy   Spinal cord stimulator pulse generator  01/05/2005   placed in right upper buttocks, Dr. CHRISTELLA. Carwin   SPINE SURGERY  1992 to 2016   THYROID  LOBECTOMY Right 02/13/2016   THYROIDECTOMY Right 02/13/2016   Procedure: RIGHT THYROID  LOBECTOMY WITH FROZEN SECTION ;  Surgeon: Ida Loader, MD;  Location: [facility];  Service: ENT;  Laterality: Right;   TONSILLECTOMY     TOTAL HIP ARTHROPLASTY Right 01/18/2024   Procedure: ARTHROPLASTY, HIP, TOTAL, ANTERIOR APPROACH;  Surgeon: Melodi Lerner, MD;  Location: [facility];  Service: Orthopedics;  Laterality: Right;   TUBAL LIGATION  1983  Social History   Socioeconomic History   Marital status: Widowed    Spouse name: Not on file   Number of children: Not on file   Years of education: Not on file   Highest education level: 12th grade  Occupational History   Occupation: RETIRED  Tobacco Use   Smoking status: Former    Current packs/day: 0.00    Average packs/day: 0.5 packs/day for 15.0 years (7.5 ttl pk-yrs)    Types: Cigarettes    Quit date: 50    Years since quitting: 33.8   Smokeless tobacco: Never   Tobacco comments:    quit smoking in 1992  Vaping Use   Vaping status: Never Used  Substance and Sexual Activity   Alcohol  use: Yes    Alcohol /week: 3.0 standard drinks of alcohol     Types: 3 Standard drinks or equivalent per week    Comment: Maybe 3 drinks a month   Drug use: No   Sexual activity: Not Currently    Birth control/protection: Post-menopausal    Comment: Less than 5, after 16, hx genital warts, no abnormal pap, no DES  Other Topics Concern   Not on file  Social History Narrative   Walks the dog exercise.  Has to stop breath while walking the dog.    Drinks about 3 caffeine drinks a week    Currently disabled  after 8 back surgeries with spinal cord/injury   Right handed   Lives alone; one story home/2025   Social Drivers of Health   Financial Resource Strain: Low Risk  (04/02/2024)   Overall Financial Resource Strain (CARDIA)    Difficulty of Paying Living Expenses: Not hard at all  Food Insecurity: No Food Insecurity (04/02/2024)   Hunger Vital Sign    Worried About Running Out of Food in the Last Year: Never true    Ran Out of Food in the Last Year: Never true  Transportation Needs: No Transportation Needs (04/02/2024)   PRAPARE - Administrator, Civil Service (Medical): No    Lack of Transportation (Non-Medical): No  Physical Activity: Inactive (04/02/2024)   Exercise Vital Sign    Days of Exercise per Week: 0 days    Minutes of Exercise per Session: Not on file  Stress: No Stress Concern Present (04/02/2024)   [employer] of Occupational Health - Occupational Stress Questionnaire    Feeling of Stress: Not at all  Social Connections: Socially Isolated (04/02/2024)   Social Connection and Isolation Panel    Frequency of Communication with Friends and Family: Once a week    Frequency of Social Gatherings with Friends and Family: Once a week    Attends Religious Services: Never    Database Administrator or Organizations: No    Attends Engineer, Structural: Not on file    Marital Status: Widowed  Intimate Partner Violence: Not At Risk (01/18/2024)   Humiliation, Afraid, Rape, and Kick questionnaire    Fear of Current or Ex-Partner: No    Emotionally Abused: No    Physically Abused: No    Sexually Abused: No    Family History  Problem Relation Age of Onset   Aneurysm Mother        brain   Stroke Mother 48   Alcohol  abuse Mother    Early death Mother    Hypertension Father    Atrial fibrillation Father    Arthritis Father    COPD Father    Colon cancer Father  Liver cancer Father    Cancer Father    Hypertension Sister    Anxiety disorder Sister     Depression Sister    Hearing loss Sister    Kidney disease Sister    Varicose Veins Sister    Hypertension Brother    Cancer Brother        adrenal glands   Early death Brother    Lung cancer Brother    Brain cancer Brother    Hypertension Brother    Alcohol  abuse Brother    Anxiety disorder Brother    Depression Brother    Drug abuse Brother    Hypertension Brother    Alcohol  abuse Brother    Anxiety disorder Brother    Depression Brother    Drug abuse Brother    Hypertension Brother    Breast cancer Neg Hx    BRCA 1/2 Neg Hx      Review of Systems  Constitutional: Negative.  Negative for chills and fever.  HENT: Negative.  Negative for congestion and sore throat.   Respiratory: Negative.  Negative for cough and shortness of breath.   Cardiovascular: Negative.  Negative for chest pain and palpitations.  Gastrointestinal:  Positive for diarrhea. Negative for abdominal pain, blood in stool, melena, nausea and vomiting.  Genitourinary: Negative.  Negative for dysuria and hematuria.  Skin: Negative.  Negative for rash.  Neurological: Negative.  Negative for dizziness and headaches.  All other systems reviewed and are negative.   Vitals:   08/27/24 1422  BP: 122/80  Pulse: 79  Temp: 98.5 F (36.9 C)  SpO2: 98%    Physical Exam Vitals reviewed.  Constitutional:      Appearance: Normal appearance.  HENT:     Head: Normocephalic.     Mouth/Throat:     Mouth: Mucous membranes are moist.     Pharynx: Oropharynx is clear.  Eyes:     Extraocular Movements: Extraocular movements intact.     Pupils: Pupils are equal, round, and reactive to light.  Cardiovascular:     Rate and Rhythm: Normal rate and regular rhythm.     Pulses: Normal pulses.     Heart sounds: Normal heart sounds.  Pulmonary:     Effort: Pulmonary effort is normal.     Breath sounds: Normal breath sounds.  Musculoskeletal:     Cervical back: No tenderness.  Lymphadenopathy:     Cervical: No  cervical adenopathy.  Skin:    General: Skin is warm and dry.     Capillary Refill: Capillary refill takes less than 2 seconds.  Neurological:     General: No focal deficit present.     Mental Status: She is alert and oriented to person, place, and time.  Psychiatric:        Mood and Affect: Mood normal.        Behavior: Behavior normal.      ASSESSMENT & PLAN: I personally spent a total of 30 minutes minutes in the care of the patient today including preparing to see the patient, getting/reviewing separately obtained history, performing a medically appropriate exam/evaluation, counseling and educating, documenting clinical information in the EHR, independently interpreting results, communicating results, coordinating care, and prognosis, education on nutrition, management of diarrhea, and need for follow-up with PCP and endocrinologist as scheduled.  Problem List Items Addressed This Visit       Digestive   Functional diarrhea - Primary   Clinically stable.  No red flag signs or symptoms Benign physical examination  History of hypothyroidism.  Synthroid  dose recently increased Differential diagnosis discussed including possibility of celiac disease Diet and nutrition discussed Diarrhea management discussed Advised to stay well-hydrated and avoid fried spicy food Recommend to start gluten-free diet for the next couple weeks Follow-up with endocrinologist as scheduled and also follow-up with PCP when possible.      Patient Instructions  Gluten-Free Diet for Celiac Disease, Adult  The gluten-free diet includes all foods that do not contain gluten. Gluten is a protein that is found in wheat, rye, barley, and some other grains. Following the gluten-free diet is the only treatment for people with celiac disease. It helps to prevent damage to the intestines and improves or eliminates the symptoms of celiac disease. Following the gluten-free diet requires some planning. It can be  challenging at first, but it gets easier with time and practice. There are more gluten-free options available today than ever before. If you need help finding gluten-free foods or if you have questions, talk with your dietitian or health care provider. What are tips for following this plan? Reading food labels Read all food labels. Gluten is often added to foods. Always check the ingredient list and look for warnings that the food may contain gluten. Foods that list any of these key words on the label usually contain gluten: Wheat, flour, enriched flour, bromated flour, white flour, durum flour, graham flour, phosphated flour, self-rising flour, semolina, farina, barley (malt), rye, and oats. Starch, dextrin, modified food starch, or cereal. Thickening, fillers, or emulsifiers. Malt flavoring, malt extract, or malt syrup. Hydrolyzed vegetable protein. In the [place] packaged foods that are gluten-free are required to be labeled GF. These foods should be easy to identify and are safe to eat. In the [place] food companies are also required to list common food allergens, including wheat, on their labels. Shopping When grocery shopping, start in the produce, meat, and dairy sections. These areas are more likely to contain gluten-free foods. Then move to the aisles that contain packaged foods if you need to. Meal planning All fruits, vegetables, and meats are safe to eat and do not contain gluten. Talk with your dietitian or health care provider before taking a gluten-free multivitamin or mineral supplement. Be aware of gluten-free foods having contact with foods that contain gluten (cross-contamination). This can happen at home and with any processed foods. Talk with your health care provider or dietitian about how to reduce the risk of cross-contamination in your home. If you have questions about how a food is processed, ask the manufacturer. What foods can I eat? Fruits All plain fresh, frozen,  canned, and dried fruits, and 100% fruit juices. Vegetables All plain fresh, frozen, and canned vegetables, and 100% vegetable juices. Grains Amaranth, bean flours, 100% buckwheat flour, corn, millet, nut flours or nut meals, GF oats, quinoa, rice, sorghum, teff, rice wafers, pure cornmeal tortillas, popcorn, and hot cereals made from cornmeal or GF grains. Hominy, rice, and wild rice. Some Asian rice noodles or bean noodles. Arrowroot starch, corn bran, corn flour, corn germ, cornmeal, corn starch, potato flour, potato starch flour, and rice bran. Plain, brown, and sweet rice flours. Rice polish, soy flour, and tapioca starch. Meats and other protein foods All fresh beef, pork, poultry, fish, seafood, and eggs. Fish canned in water, oil, brine, or vegetable broth. Plain nuts and seeds, peanut butter.  Some precooked or cured meat, such as sausages or meat loaves. Some frankfurters. Dried beans, dried peas, and lentils. Dairy Fresh plain, dry, evaporated, or  condensed milk. Cream, butter, sour cream, whipping cream, and most yogurts. Unprocessed cheese, most processed cheeses, some cottage cheeses, and some cream cheeses. Beverages Coffee, tea, and most herbal teas. Carbonated beverages and some root beers. Wine, sake, and pure distilled spirits, such as gin, vodka, and whiskey. Most hard ciders. Fats and oils Butter, margarine, vegetable oil, hydrogenated butter, olive oil, shortening, lard, cream, and some mayonnaise. Some commercial salad dressings. Olives. Sweets and desserts Sugar, honey, some syrups, molasses, jelly, and jam. Plain hard candy, marshmallows, and gumdrops.  Pure cocoa powder. Plain chocolate. Custard and some pudding mixes. Gelatin desserts, sorbets, frozen ice pops, and sherbet.  Cake, cookies, and other desserts prepared with allowed flours. Some commercial ice creams. Cornstarch, tapioca, and rice puddings. Seasoning and other foods Some canned or frozen soups. Monosodium  glutamate (MSG). Cider, rice, and wine vinegar. Baking soda and baking powder.  Cream of tartar. Baking and nutritional yeast. Certain soy sauces made without wheat. Ask your dietitian about specific brands that are allowed. Nuts, coconut, and chocolate. Salt, pepper, herbs, spices, flavoring extracts, imitation or artificial flavorings, natural flavorings, and food colorings.  Some medicines and supplements. Rice syrups. The items listed above may not be a complete list of foods and beverages you can eat and drink. Contact a dietitian for more information. What foods should I avoid? Fruits Thickened or prepared fruits and some pie fillings. Some fruit snacks and fruit roll-ups. Vegetables Most creamed vegetables and most vegetables canned in sauces. Some commercially prepared vegetables and salads. Vegetables in a soy sauce marinade or dressing. Grains Barley, bran, bulgur, couscous, cracked wheat, [place], farro, graham, malt, matzo, semolina, wheat germ, and all wheat and rye cereals, including spelt and kamut. Cereals containing malt as a flavoring, such as rice cereal. Noodles, spaghetti, macaroni, most packaged rice mixes, and all mixes containing wheat, rye, barley, or triticale. Meats and other protein foods Any meat or meat alternative containing wheat, rye, barley, or gluten stabilizers. These are often marinated or packaged meats, and precooked or cured meat, such as sausages or meat loaves. Bread-containing products, such as Swiss steak, croquettes, meatballs, and meatloaf. Most tuna canned in vegetable broth. Turkey with hydrolyzed vegetable protein (HVP) injected as part of the basting. Seitan. Imitation fish. Eggs in sauces made from ingredients to avoid. Dairy Commercial chocolate milk drinks and malted milk. Some non-dairy creamers. Any cheese product containing ingredients to avoid. Beverages Certain cereal beverages. Beer, ale, malted milk, and some root beers. Some hard ciders.  Some instant flavored coffees. Some herbal teas made with barley or with barley malt added. Fats and oils Some commercial salad dressings. Sour cream containing modified food starch. Sweets and desserts Some toffees. Chocolate-coated nuts (may be rolled in wheat flour) and some commercial candies and candy bars.  Most cakes, cookies, donuts, pastries, and other baked goods. Some commercial ice cream. Ice cream cones.  Commercially prepared mixes for cakes, cookies, and other desserts. Bread pudding and other puddings thickened with flour.  Products containing brown rice syrup made with barley malt enzyme. Desserts and sweets made with malt flavoring. Seasoning and other foods Some curry powders, some dry seasoning mixes, some gravy extracts, some meat sauces, some ketchups, some prepared mustards, and horseradish.  Certain soy sauces. Malt vinegar. Bouillon and bouillon cubes that contain HVP. Some chip dips. Some chewing gum. Yeast extract. Brewer's yeast. Caramel color.  Some medicines and supplements. The items listed above may not be a complete list of foods and beverages you should avoid.  Contact a dietitian for more information. Summary Gluten is a protein that is found in wheat, rye, barley, and some other grains. The gluten-free diet includes all foods that do not contain gluten. If you need help finding gluten-free foods or if you have questions, talk with your dietitian or your health care provider. Read all food labels. Gluten is often added to foods. Always check the ingredient list and look for warnings that the food may contain gluten. This information is not intended to replace advice given to you by your health care provider. Make sure you discuss any questions you have with your health care provider. Document Revised: 08/25/2021 Document Reviewed: 08/25/2021 Elsevier Patient Education  2024 Elsevier Inc.   Emil Schaumann, MD [facility]

## 2024-08-27 NOTE — Progress Notes (Signed)
 GYNECOLOGY  VISIT   HPI: 68 y.o.   Widowed  Caucasian female   G1P0001 with Patient's last menstrual period was 06/24/2012.   here for: 4 month follow up - genital warts/estrace .       No current breakouts of warts.       Patient seen for follow up of Aldara  use 05/17/24 and was noted to have vulvitis likely from the Aldara .   She was instructed to use this twice weekly instead of three times.  She has not needed to continue this.   Rx for valisone  ointment.  Not using this.   Rx for vaginal estradiol  cream twice a week.  Still feels dryness.    Rx for lidocaine  ointment for rescue medication for external irritation.   Has LLQ pain that is a sharp pain that comes and goes since August, 2024. No vaginal bleeding.    Can have uncontrolled bowel movements with diarrhea.    Had unremarkable pelvic US  and CT scan in 2024.   Seeing endocrinology for thyroid  dysfunction and will have testing for celiac.  She will see her PCP today as well.  GYNECOLOGIC HISTORY: Patient's last menstrual period was 06/24/2012. Contraception:  PMP Menopausal hormone therapy:  Estrace   Last 2 paps:  05/23/23 neg HR HPV neg, 02/11/22 neg HR HPV neg, 03/23/17 neg, neg HR HPV  History of abnormal Pap or positive HPV:  no Mammogram:  04/19/24 Breast Density Cat B, BIRADS Cat 1 neg          OB History     Gravida  1   Para  1   Term  0   Preterm  0   AB  0   Living  1      SAB  0   IAB  0   Ectopic  0   Multiple      Live Births                 Patient Active Problem List   Diagnosis Date Noted   OA (osteoarthritis) of hip 01/18/2024   Primary osteoarthritis of right hip 01/18/2024   Pre-operative cardiovascular examination 11/11/2023   Routine general medical examination at a health care facility 02/14/2023   Right hip pain 02/14/2023   Hyperlipidemia 08/27/2022   GERD (gastroesophageal reflux disease) 08/27/2022   Pain in right hand 05/07/2020   Lumbar facet joint syndrome  11/15/2019   Lumbar spondylosis 11/15/2019   Pain in left knee 06/07/2017   Snoring 09/27/2016   Other chronic pain 09/27/2016   CKD (chronic kidney disease), stage III (HCC) 09/27/2016   Failed back syndrome, lumbar 09/27/2016   Positive for microalbuminuria 09/27/2016   Essential hypertension 09/27/2016   Vaginal atrophy 02/18/2014   Endometriosis    Chronic hepatitis C (HCC) 09/15/2011   DEPRESSION/ANXIETY 01/24/2008   CONSTIPATION, CHRONIC 04/27/2007   INSOMNIA, PERSISTENT 04/06/2007   Postoperative hypothyroidism 02/07/2007   OSTEOPENIA 02/07/2007    Past Medical History:  Diagnosis Date   Allergy 1979   dust mites, mold,pollen   Anxiety    Arthritis    Cataract Will have surgery in May 2024   DDD (degenerative disc disease), lumbar    neck   Depression    GERD (gastroesophageal reflux disease)    Hepatitis    treated by  Dr. Mills ee physcians   History of bronchitis    History of hiatal hernia    History of kidney stones    Hyperlipidemia    Hypertension  Hypothyroidism    Osteoporosis 2025   forearm   Spinal cord stimulator status    placed 2006   Thyroid  disease    takes synthroid     Past Surgical History:  Procedure Laterality Date   Anterior cervical decompression and fusion with a reflex hyper plate  89/93/7993   C4-C5 and C5-C6 , Dr. CHRISTELLA. Roy   BREAST EXCISIONAL BIOPSY Left 1982   BREAST SURGERY  1983   left breast cyst removed   CATARACT EXTRACTION Left 05/25/2024   cervical fusion w/OASIS system infuse  05/13/2006   C4-C5 and C5-C6 and C6-7,  Dr. emerson Carwin   CHOLECYSTECTOMY  ?2002   CHOLECYSTECTOMY  2002   COLONOSCOPY     ESOPHAGEAL DILATION     Dr. Dyane   KNEE SURGERY  06/2017   torn meniscus   LAPAROSCOPIC ENDOMETRIOSIS FULGURATION  1980's   Dr. Christophe   LIVER BIOPSY  2002   LOWER BACK SURGERY  07/2015   LUMBAR DISC SURGERY  08/13/2003   Diskectomy/fusion L5-S1 rt., Dr. EMERSON Carwin   lumbar hemilaminectomy  01/1989   L5-S1 on right, Dr.  Candance   NASAL SINUS SURGERY  10/30/2007   Left upper Sinus lift,  Dr. CANDIE Lloyd   SHOULDER ARTHROSCOPY WITH SUBACROMIAL DECOMPRESSION Right 12/06/2014   Procedure: SHOULDER ARTHROSCOPY WITH SUBACROMIAL DECOMPRESSION; EXCISION BONE SPUR;  Surgeon: Elsie Mussel, MD;  Location: [facility];  Service: Orthopedics;  Laterality: Right;   SHOULDER SURGERY Right 12/06/2014   cleaned up bone spurs   SPINAL CORD STIMULATOR INSERTION  12/29/2004   leads placed along spine from T8 to T10, Dr. CHRISTELLA. Roy   Spinal cord stimulator pulse generator  01/05/2005   placed in right upper buttocks, Dr. CHRISTELLA. Carwin   SPINE SURGERY  1992 to 2016   THYROID  LOBECTOMY Right 02/13/2016   THYROIDECTOMY Right 02/13/2016   Procedure: RIGHT THYROID  LOBECTOMY WITH FROZEN SECTION ;  Surgeon: Ida Loader, MD;  Location: [facility];  Service: ENT;  Laterality: Right;   TONSILLECTOMY     TOTAL HIP ARTHROPLASTY Right 01/18/2024   Procedure: ARTHROPLASTY, HIP, TOTAL, ANTERIOR APPROACH;  Surgeon: Melodi Lerner, MD;  Location: [facility];  Service: Orthopedics;  Laterality: Right;   TUBAL LIGATION  1983    Current Outpatient Medications  Medication Sig Dispense Refill   betamethasone  valerate ointment (VALISONE ) 0.1 % Apply 1 Application topically 2 (two) times daily. Use sparingly on the vulva as needed twice a day for 2 weeks.  You may apply twice a week at bedtime for maintenance dosing. 45 g 0   estradiol  (ESTRACE ) 0.1 MG/GM vaginal cream Place 1/2 gram per vagina and 1/2 gram to vulva at bedtime three times per week. 42.5 g 1   fluticasone  (FLONASE ) 50 MCG/ACT nasal spray      imiquimod  (ALDARA ) 5 % cream Apply topically 3 (three) times a week. Apply entire packet of cream to affected areas 3 times weekly.  Apply at night and leave on during sleeping hours.  Wash off completely after 8 hours. 12 each 3   levothyroxine  (SYNTHROID ) 112 MCG tablet Take 112 mcg by mouth every morning.     lisinopril -hydrochlorothiazide   (ZESTORETIC ) 20-12.5 MG tablet TAKE 1/2 TABLET EVERY MORNING 45 tablet 3   Naphazoline-Pheniramine (OPCON-A) 0.027-0.315 % SOLN Place 1 drop into both eyes as needed (eye allergies).     pantoprazole  (PROTONIX ) 40 MG tablet TAKE 1 TABLET EVERY DAY (NEED MD APPOINTMENT FOR REFILLS) 90 tablet 3   pravastatin  (PRAVACHOL ) 20  MG tablet TAKE 1 TABLET EVERY DAY 90 tablet 3   rizatriptan  (MAXALT ) 10 MG tablet Take 1 tablet earliest onset of migraine.  May repeat in 2 hours if needed.  Maximum 2 tablets in 24 hours. 10 tablet 5   traZODone  (DESYREL ) 150 MG tablet TAKE 1 TABLET AT BEDTIME (Patient taking differently: Take 75 mg by mouth at bedtime.) 90 tablet 3   No current facility-administered medications for this visit.     ALLERGIES: Morphine , Iodinated contrast media, Codeine, and Imitrex  [sumatriptan ]  Family History  Problem Relation Age of Onset   Aneurysm Mother        brain   Stroke Mother 4   Alcohol  abuse Mother    Early death Mother    Hypertension Father    Atrial fibrillation Father    Arthritis Father    COPD Father    Colon cancer Father    Liver cancer Father    Cancer Father    Hypertension Sister    Anxiety disorder Sister    Depression Sister    Hearing loss Sister    Kidney disease Sister    Varicose Veins Sister    Hypertension Brother    Cancer Brother        adrenal glands   Early death Brother    Lung cancer Brother    Brain cancer Brother    Hypertension Brother    Alcohol  abuse Brother    Anxiety disorder Brother    Depression Brother    Drug abuse Brother    Hypertension Brother    Alcohol  abuse Brother    Anxiety disorder Brother    Depression Brother    Drug abuse Brother    Hypertension Brother    Breast cancer Neg Hx    BRCA 1/2 Neg Hx     Social History   Socioeconomic History   Marital status: Widowed    Spouse name: Not on file   Number of children: Not on file   Years of education: Not on file   Highest education level: 12th  grade  Occupational History   Occupation: RETIRED  Tobacco Use   Smoking status: Former    Current packs/day: 0.00    Average packs/day: 0.5 packs/day for 15.0 years (7.5 ttl pk-yrs)    Types: Cigarettes    Quit date: 73    Years since quitting: 33.8   Smokeless tobacco: Never   Tobacco comments:    quit smoking in 1992  Vaping Use   Vaping status: Never Used  Substance and Sexual Activity   Alcohol  use: Yes    Alcohol /week: 3.0 standard drinks of alcohol     Types: 3 Standard drinks or equivalent per week    Comment: Maybe 3 drinks a month   Drug use: No   Sexual activity: Not Currently    Birth control/protection: Post-menopausal    Comment: Less than 5, after 16, hx genital warts, no abnormal pap, no DES  Other Topics Concern   Not on file  Social History Narrative   Walks the dog exercise.  Has to stop breath while walking the dog.    Drinks about 3 caffeine drinks a week    Currently disabled after 8 back surgeries with spinal cord/injury   Right handed   Lives alone; one story home/2025   Social Drivers of Health   Financial Resource Strain: Low Risk  (04/02/2024)   Overall Financial Resource Strain (CARDIA)    Difficulty of Paying Living Expenses: Not hard at  all  Food Insecurity: No Food Insecurity (04/02/2024)   Hunger Vital Sign    Worried About Running Out of Food in the Last Year: Never true    Ran Out of Food in the Last Year: Never true  Transportation Needs: No Transportation Needs (04/02/2024)   PRAPARE - Administrator, Civil Service (Medical): No    Lack of Transportation (Non-Medical): No  Physical Activity: Inactive (04/02/2024)   Exercise Vital Sign    Days of Exercise per Week: 0 days    Minutes of Exercise per Session: Not on file  Stress: No Stress Concern Present (04/02/2024)   [employer] of Occupational Health - Occupational Stress Questionnaire    Feeling of Stress: Not at all  Social Connections: Socially Isolated  (04/02/2024)   Social Connection and Isolation Panel    Frequency of Communication with Friends and Family: Once a week    Frequency of Social Gatherings with Friends and Family: Once a week    Attends Religious Services: Never    Database Administrator or Organizations: No    Attends Engineer, Structural: Not on file    Marital Status: Widowed  Intimate Partner Violence: Not At Risk (01/18/2024)   Humiliation, Afraid, Rape, and Kick questionnaire    Fear of Current or Ex-Partner: No    Emotionally Abused: No    Physically Abused: No    Sexually Abused: No    Review of Systems  All other systems reviewed and are negative.   PHYSICAL EXAMINATION:   BP 124/84 (BP Location: Left Arm, Patient Position: Sitting)   Pulse 87   LMP 06/24/2012   SpO2 97%     General appearance: alert, cooperative and appears stated age  Pelvic: External genitalia:  no lesions              Urethra:  normal appearing urethra with no masses, tenderness or lesions              Bartholins and Skenes: normal                 Vagina: normal appearing vagina with normal color and discharge, no lesions              Cervix: no lesions                Bimanual Exam:  Uterus:  normal size, contour, position, consistency, mobility, non-tender              Adnexa: no mass, fullness, tenderness       Chaperone was present for exam:  Erica L, CMA  ASSESSMENT:  Condyloma resolved.  Vaginal atrophy.  Encounter for medication monitoring.  LLQ pain.  Intermittent.  Hypothyroidism.   PLAN:  Increased vaginal estradiol  cream to 1/2 gram three times weekly.   Refills given.  CT and pelvic ultrasound results reviewed.  We talked about a trial of a gluten free diet and increasing fiber.  May need to see GI.  Return for next routine office visit in July, 2026 and prn.   25 min  total time was spent for this patient encounter, including preparation, face-to-face counseling with the patient, coordination of  care, and documentation of the encounter.

## 2024-08-27 NOTE — Patient Instructions (Signed)
Gluten-Free Diet for Celiac Disease, Adult  The gluten-free diet includes all foods that do not contain gluten. Gluten is a protein that is found in wheat, rye, barley, and some other grains. Following the gluten-free diet is the only treatment for people with celiac disease. It helps to prevent damage to the intestines and improves or eliminates the symptoms of celiac disease. Following the gluten-free diet requires some planning. It can be challenging at first, but it gets easier with time and practice. There are more gluten-free options available today than ever before. If you need help finding gluten-free foods or if you have questions, talk with your dietitian or health care provider. What are tips for following this plan? Reading food labels Read all food labels. Gluten is often added to foods. Always check the ingredient list and look for warnings that the food may contain gluten. Foods that list any of these key words on the label usually contain gluten: Wheat, flour, enriched flour, bromated flour, white flour, durum flour, graham flour, phosphated flour, self-rising flour, semolina, farina, barley (malt), rye, and oats. Starch, dextrin, modified food starch, or cereal. Thickening, fillers, or emulsifiers. Malt flavoring, malt extract, or malt syrup. Hydrolyzed vegetable protein. In the [place] packaged foods that are gluten-free are required to be labeled "GF." These foods should be easy to identify and are safe to eat. In the [place] food companies are also required to list common food allergens, including wheat, on their labels. Shopping When grocery shopping, start in the produce, meat, and dairy sections. These areas are more likely to contain gluten-free foods. Then move to the aisles that contain packaged foods if you need to. Meal planning All fruits, vegetables, and meats are safe to eat and do not contain gluten. Talk with your dietitian or health care provider before taking a  gluten-free multivitamin or mineral supplement. Be aware of gluten-free foods having contact with foods that contain gluten (cross-contamination). This can happen at home and with any processed foods. Talk with your health care provider or dietitian about how to reduce the risk of cross-contamination in your home. If you have questions about how a food is processed, ask the manufacturer. What foods can I eat? Fruits All plain fresh, frozen, canned, and dried fruits, and 100% fruit juices. Vegetables All plain fresh, frozen, and canned vegetables, and 100% vegetable juices. Grains Amaranth, bean flours, 100% buckwheat flour, corn, millet, nut flours or nut meals, GF oats, quinoa, rice, sorghum, teff, rice wafers, pure cornmeal tortillas, popcorn, and hot cereals made from cornmeal or GF grains. Hominy, rice, and wild rice. Some Asian rice noodles or bean noodles. Arrowroot starch, corn bran, corn flour, corn germ, cornmeal, corn starch, potato flour, potato starch flour, and rice bran. Plain, brown, and sweet rice flours. Rice polish, soy flour, and tapioca starch. Meats and other protein foods All fresh beef, pork, poultry, fish, seafood, and eggs. Fish canned in water, oil, brine, or vegetable broth. Plain nuts and seeds, peanut butter.  Some precooked or cured meat, such as sausages or meat loaves. Some frankfurters. Dried beans, dried peas, and lentils. Dairy Fresh plain, dry, evaporated, or condensed milk. Cream, butter, sour cream, whipping cream, and most yogurts. Unprocessed cheese, most processed cheeses, some cottage cheeses, and some cream cheeses. Beverages Coffee, tea, and most herbal teas. Carbonated beverages and some root beers. Wine, sake, and pure distilled spirits, such as gin, vodka, and whiskey. Most hard ciders. Fats and oils Butter, margarine, vegetable oil, hydrogenated butter, olive oil, shortening,  lard, cream, and some mayonnaise. Some commercial salad dressings.  Olives. Sweets and desserts Sugar, honey, some syrups, molasses, jelly, and jam. Plain hard candy, marshmallows, and gumdrops.  Pure cocoa powder. Plain chocolate. Custard and some pudding mixes. Gelatin desserts, sorbets, frozen ice pops, and sherbet.  Cake, cookies, and other desserts prepared with allowed flours. Some commercial ice creams. Cornstarch, tapioca, and rice puddings. Seasoning and other foods Some canned or frozen soups. Monosodium glutamate (MSG). Cider, rice, and wine vinegar. Baking soda and baking powder.  Cream of tartar. Baking and nutritional yeast. Certain soy sauces made without wheat. Ask your dietitian about specific brands that are allowed. Nuts, coconut, and chocolate. Salt, pepper, herbs, spices, flavoring extracts, imitation or artificial flavorings, natural flavorings, and food colorings.  Some medicines and supplements. Rice syrups. The items listed above may not be a complete list of foods and beverages you can eat and drink. Contact a dietitian for more information. What foods should I avoid? Fruits Thickened or prepared fruits and some pie fillings. Some fruit snacks and fruit roll-ups. Vegetables Most creamed vegetables and most vegetables canned in sauces. Some commercially prepared vegetables and salads. Vegetables in a soy sauce marinade or dressing. Grains Barley, bran, bulgur, couscous, cracked wheat, [place], farro, graham, malt, matzo, semolina, wheat germ, and all wheat and rye cereals, including spelt and kamut. Cereals containing malt as a flavoring, such as rice cereal. Noodles, spaghetti, macaroni, most packaged rice mixes, and all mixes containing wheat, rye, barley, or triticale. Meats and other protein foods Any meat or meat alternative containing wheat, rye, barley, or gluten stabilizers. These are often marinated or packaged meats, and precooked or cured meat, such as sausages or meat loaves. Bread-containing products, such as Swiss steak,  croquettes, meatballs, and meatloaf. Most tuna canned in vegetable broth. [place] with hydrolyzed vegetable protein (HVP) injected as part of the basting. Seitan. Imitation fish. Eggs in sauces made from ingredients to avoid. Dairy Commercial chocolate milk drinks and malted milk. Some non-dairy creamers. Any cheese product containing ingredients to avoid. Beverages Certain cereal beverages. Beer, ale, malted milk, and some root beers. Some hard ciders. Some instant flavored coffees. Some herbal teas made with barley or with barley malt added. Fats and oils Some commercial salad dressings. Sour cream containing modified food starch. Sweets and desserts Some toffees. Chocolate-coated nuts (may be rolled in wheat flour) and some commercial candies and candy bars.  Most cakes, cookies, donuts, pastries, and other baked goods. Some commercial ice cream. Ice cream cones.  Commercially prepared mixes for cakes, cookies, and other desserts. Bread pudding and other puddings thickened with flour.  Products containing brown rice syrup made with barley malt enzyme. Desserts and sweets made with malt flavoring. Seasoning and other foods Some curry powders, some dry seasoning mixes, some gravy extracts, some meat sauces, some ketchups, some prepared mustards, and horseradish.  Certain soy sauces. Malt vinegar. Bouillon and bouillon cubes that contain HVP. Some chip dips. Some chewing gum. Yeast extract. Brewer's yeast. Caramel color.  Some medicines and supplements. The items listed above may not be a complete list of foods and beverages you should avoid. Contact a dietitian for more information. Summary Gluten is a protein that is found in wheat, rye, barley, and some other grains. The gluten-free diet includes all foods that do not contain gluten. If you need help finding gluten-free foods or if you have questions, talk with your dietitian or your health care provider. Read all food labels. Gluten is often  added  to foods. Always check the ingredient list and look for warnings that the food may contain gluten. This information is not intended to replace advice given to you by your health care provider. Make sure you discuss any questions you have with your health care provider. Document Revised: 08/25/2021 Document Reviewed: 08/25/2021 Elsevier Patient Education  2024 [employer].

## 2024-08-27 NOTE — Assessment & Plan Note (Signed)
 Clinically stable.  No red flag signs or symptoms Benign physical examination History of hypothyroidism.  Synthroid  dose recently increased Differential diagnosis discussed including possibility of celiac disease Diet and nutrition discussed Diarrhea management discussed Advised to stay well-hydrated and avoid fried spicy food Recommend to start gluten-free diet for the next couple weeks Follow-up with endocrinologist as scheduled and also follow-up with PCP when possible.

## 2024-08-29 ENCOUNTER — Other Ambulatory Visit: Payer: Self-pay | Admitting: Internal Medicine

## 2024-08-31 ENCOUNTER — Other Ambulatory Visit: Payer: Self-pay | Admitting: Neurology

## 2024-08-31 ENCOUNTER — Ambulatory Visit: Admitting: Internal Medicine

## 2024-09-03 DIAGNOSIS — M25511 Pain in right shoulder: Secondary | ICD-10-CM | POA: Diagnosis not present

## 2024-09-03 DIAGNOSIS — M7918 Myalgia, other site: Secondary | ICD-10-CM | POA: Diagnosis not present

## 2024-09-18 DIAGNOSIS — M79661 Pain in right lower leg: Secondary | ICD-10-CM | POA: Diagnosis not present

## 2024-10-30 ENCOUNTER — Encounter (HOSPITAL_COMMUNITY): Payer: Self-pay | Admitting: Radiology

## 2024-11-06 NOTE — Progress Notes (Unsigned)
 "  Virtual Visit via Video Note:   Consent was obtained for video visit:  Yes.   Answered questions that patient had about telehealth interaction:  Yes.   I discussed the limitations, risks, security and privacy concerns of performing an evaluation and management service by telemedicine. I also discussed with the patient that there may be a patient responsible charge related to this service. The patient expressed understanding and agreed to proceed.  Pt location: Home Physician Location: office Name of referring provider:  Rollene Almarie LABOR, MD I connected with [place] JINNY Pride at patients initiation/request on 11/07/2024 at 10:50 AM EST by video enabled telemedicine application and verified that I am speaking with the correct person using two identifiers. Pt MRN:  [id number] Pt DOB:  1956/07/13 Video Participants:  [place] JINNY Pride  Assessment/Plan:    Migraine without aura, without status migrainosus, not intractable, overall stable.    1.  Migraine prevention:  Medication not indicated  2.  Migraine rescue:  Rizatriptan  10mg .  Due to her age and history of hypertension, she has probable cerebrovascular disease.  I would like to avoid continuing a triptan if possible.  Will have her come by the office to try samples of Ubrelvy 100mg . 3.  Limit use of pain relievers to no more than 9 days out of the month to prevent risk of rebound or medication-overuse headache 4.  Keep headache diary 5.  Follow up 1 year    Subjective:  Nely Dedmon is a 69 year old right-handed female with HTN, degenerative disc disease of lumbar spine, s/p spinal cord stimulator, and diabetes who follows up for migraines.   UPDATE: Doing well. Intensity:  Moderate to severe Duration:  Usually 15-20 minutes with rizatriptan .   Frequency:  1 a month.    Current NSAIDS:  none Current analgesics:  acetaminophen , tramadol   Current triptans:  Rizatriptan  10mg  (effective but causes drowsiness) Current ergotamine:   none Current anti-emetic:  none Current muscle relaxants:  Flexeril  Current anti-anxiolytic:  none Current sleep aide:  none Current Antihypertensive medications:  Lisinopril -HCTZ Current Antidepressant medications:  trazodone  Current Anticonvulsant medications:  Gabapentin  300mg  TID Current anti-CGRP:  none Current Vitamins/Herbal/Supplements:  none Current Antihistamines/Decongestants:  none Other therapy:  none Other medications:  levothyroxine    Caffeine:  1 cup decaf coffee, 1 cup decaf tea.  No soda Diet:  Drinks mostly water.  Eats two meals a day.  Sometimes may snack Exercise:  no Depression:  yes; Anxiety:  yes.  Mrs. Budzik husband passed away last month due to complications of interstitial pulmonary fibrosis.  She has had a difficult month. Other pain:  Chronic back pain Sleep hygiene:  Takes a couple of hours to fall asleep.  Gets up a couple of times to go to the bathroom   HISTORY:  In April 2021, she was hospitalized and diagnosed with transient global amnesia.  She kept repeating questions and couldn't remember having shoulder surgery the prior month.  She didn't remember her activities for that day.  CT of head was unremarkable (unable to have MRI due to spinal cord stimulator).  EEG was normal.     A couple of months later, she developed vertigo.  When she would walk, she felt like her environment was moving.  If she was in bed, it felt like her bed was turning.  She was coming out of the bathroom and fell.  She did not hit her head.  If she was bent over and came back up, she  would feel dizzy, lasting 4 to 6 minutes.  She was on meclizine .  This was ongoing for a couple of months.  Dizziness overall improved, but may have a moment of dizziness for a second when she got up.  She reports prior history of vertigo a couple of years ago, which she attributes to receiving both the flu shot and shingles shot on the same day. She was in the bed for a week before it resolved but  nothing as severe as this recent event.     She started having migraines in 1980, after the birth of her daughter.  They are severe and throbbing, either mid-frontal to top of head, back of head or temples.  Associated with nausea, photophobia, phonophobia.  It lasts 30 minutes with rizatriptan  and occurs about 7 migraines in 30 days.       Past NSAIDS:  Ibuprofen, naproxen  Past analgesics:  Tylenol  Past abortive triptans:  Sumatriptan  Eyota (hyperventilating, near syncope) Past abortive ergotamine:  none Past muscle relaxants:  none Past anti-emetic:  none Past antihypertensive medications:  none Past antidepressant medications:  amitriptyline, desvenlafaxine, Cymbalta, sertraline Past anticonvulsant medications:  topiramate Past anti-CGRP:  Aimovig  expensive Past vitamins/Herbal/Supplements:  none Past antihistamines/decongestants: meclizine  Other past therapies:  none     Family history of headache:  Mother (brain aneurysm in 1965), brother (migraines), daughter (migraines), granddaughter (migraines)  Past Medical History: Past Medical History:  Diagnosis Date   Allergy 1979   dust mites, mold,pollen   Anxiety    Arthritis    Cataract Will have surgery in May 2024   DDD (degenerative disc disease), lumbar    neck   Depression    GERD (gastroesophageal reflux disease)    Hepatitis    treated by  Dr. Mills ee physcians   History of bronchitis    History of hiatal hernia    History of kidney stones    Hyperlipidemia    Hypertension    Hypothyroidism    Osteoporosis 2025   forearm   Spinal cord stimulator status    placed 2006   Thyroid  disease    takes synthroid     Medications: Outpatient Encounter Medications as of 11/07/2024  Medication Sig   betamethasone  valerate ointment (VALISONE ) 0.1 % Apply 1 Application topically 2 (two) times daily. Use sparingly on the vulva as needed twice a day for 2 weeks.  You may apply twice a week at bedtime for maintenance dosing.    estradiol  (ESTRACE ) 0.01 % CREA vaginal cream Use 1/2 g vaginally three times per week as needed to maintain symptom relief.   fluticasone  (FLONASE ) 50 MCG/ACT nasal spray    imiquimod  (ALDARA ) 5 % cream Apply topically 3 (three) times a week. Apply entire packet of cream to affected areas 3 times weekly.  Apply at night and leave on during sleeping hours.  Wash off completely after 8 hours.   levothyroxine  (SYNTHROID ) 112 MCG tablet Take 112 mcg by mouth every morning.   lisinopril -hydrochlorothiazide  (ZESTORETIC ) 20-12.5 MG tablet TAKE 1/2 TABLET EVERY MORNING   Naphazoline-Pheniramine (OPCON-A) 0.027-0.315 % SOLN Place 1 drop into both eyes as needed (eye allergies).   pantoprazole  (PROTONIX ) 40 MG tablet TAKE 1 TABLET EVERY DAY (NEED MD APPOINTMENT FOR REFILLS)   pravastatin  (PRAVACHOL ) 20 MG tablet TAKE 1 TABLET EVERY DAY   rizatriptan  (MAXALT ) 10 MG tablet TAKE 1 TABLET BY MOUTH AT ONSET OF MIGRAINE, MAY REPEAT IN 2 HOURS IF NEEDED(MAX OF 2 TABS PER 24 HOURS)   traZODone  (DESYREL ) 150  MG tablet TAKE 1 TABLET AT BEDTIME   No facility-administered encounter medications on file as of 11/07/2024.    Allergies: Allergies[1]  Family History: Family History  Problem Relation Age of Onset   Aneurysm Mother        brain   Stroke Mother 54   Alcohol  abuse Mother    Early death Mother    Hypertension Father    Atrial fibrillation Father    Arthritis Father    COPD Father    Colon cancer Father    Liver cancer Father    Cancer Father    Hypertension Sister    Anxiety disorder Sister    Depression Sister    Hearing loss Sister    Kidney disease Sister    Varicose Veins Sister    Hypertension Brother    Cancer Brother        adrenal glands   Early death Brother    Lung cancer Brother    Brain cancer Brother    Hypertension Brother    Alcohol  abuse Brother    Anxiety disorder Brother    Depression Brother    Drug abuse Brother    Hypertension Brother    Alcohol  abuse  Brother    Anxiety disorder Brother    Depression Brother    Drug abuse Brother    Hypertension Brother    Breast cancer Neg Hx    BRCA 1/2 Neg Hx     Observations/Objective:   No acute distress.  Alert and oriented.  Speech fluent and not dysarthric.  Language intact.  Eyes orthophoric on primary gaze.  Face symmetric.   Follow up Instructions:      -I discussed the assessment and treatment plan with the patient. The patient was provided an opportunity to ask questions and all were answered. The patient agreed with the plan and demonstrated an understanding of the instructions.   The patient was advised to call back or seek an in-person evaluation if the symptoms worsen or if the condition fails to improve as anticipated.   Juliene Lamar Dunnings, DO  CC: Almarie Cleveland, MD          [1]  Allergies Allergen Reactions   Morphine  Rash    Rash and abd pain    Iodinated Contrast Media Itching and Cough    Patient experienced itching of ears, coughing and scratchy throat after contrast injection.  Dr.Grady had patient observed for 10 minutes, patient discharged with symptoms resolved and no medicine administered.Dr Derrill instructed premeds prior to any future contrast study    Codeine Nausea Only   Imitrex  [Sumatriptan ] Hives    get hot and hyperventilate   "

## 2024-11-07 ENCOUNTER — Encounter: Payer: Self-pay | Admitting: Neurology

## 2024-11-07 ENCOUNTER — Telehealth: Payer: Self-pay | Admitting: Neurology

## 2024-11-07 ENCOUNTER — Telehealth: Admitting: Neurology

## 2024-11-07 ENCOUNTER — Ambulatory Visit: Payer: Medicare HMO | Admitting: Neurology

## 2024-11-07 DIAGNOSIS — G43009 Migraine without aura, not intractable, without status migrainosus: Secondary | ICD-10-CM | POA: Diagnosis not present

## 2024-11-07 NOTE — Telephone Encounter (Signed)
 Pt called to go over information for her VV

## 2024-11-07 NOTE — Telephone Encounter (Signed)
 Pt rtrnd call from Nurse HILARIO Bars

## 2025-04-05 ENCOUNTER — Ambulatory Visit

## 2025-04-30 ENCOUNTER — Encounter: Admitting: Obstetrics and Gynecology

## 2025-05-14 ENCOUNTER — Ambulatory Visit: Admitting: Neurology

## 2025-11-11 ENCOUNTER — Ambulatory Visit: Payer: Self-pay | Admitting: Neurology
